# Patient Record
Sex: Female | Born: 1970
Health system: Southern US, Community
[De-identification: ages and names within clinical notes are randomized; demographics above are authoritative.]

## PROBLEM LIST (undated history)

## (undated) DIAGNOSIS — C801 Malignant (primary) neoplasm, unspecified: Secondary | ICD-10-CM

## (undated) DIAGNOSIS — E039 Hypothyroidism, unspecified: Secondary | ICD-10-CM

## (undated) DIAGNOSIS — M1990 Unspecified osteoarthritis, unspecified site: Secondary | ICD-10-CM

## (undated) DIAGNOSIS — R51 Headache: Secondary | ICD-10-CM

## (undated) DIAGNOSIS — F32A Depression, unspecified: Secondary | ICD-10-CM

## (undated) DIAGNOSIS — D649 Anemia, unspecified: Secondary | ICD-10-CM

## (undated) DIAGNOSIS — E079 Disorder of thyroid, unspecified: Secondary | ICD-10-CM

## (undated) DIAGNOSIS — I10 Essential (primary) hypertension: Secondary | ICD-10-CM

## (undated) DIAGNOSIS — F419 Anxiety disorder, unspecified: Secondary | ICD-10-CM

## (undated) DIAGNOSIS — G43909 Migraine, unspecified, not intractable, without status migrainosus: Secondary | ICD-10-CM

## (undated) DIAGNOSIS — F329 Major depressive disorder, single episode, unspecified: Secondary | ICD-10-CM

## (undated) DIAGNOSIS — N390 Urinary tract infection, site not specified: Secondary | ICD-10-CM

## (undated) DIAGNOSIS — K760 Fatty (change of) liver, not elsewhere classified: Secondary | ICD-10-CM

## (undated) DIAGNOSIS — N189 Chronic kidney disease, unspecified: Secondary | ICD-10-CM

## (undated) DIAGNOSIS — E119 Type 2 diabetes mellitus without complications: Secondary | ICD-10-CM

## (undated) DIAGNOSIS — R519 Headache, unspecified: Secondary | ICD-10-CM

## (undated) HISTORY — DX: Migraine, unspecified, not intractable, without status migrainosus: G43.909

## (undated) HISTORY — DX: Disorder of thyroid, unspecified: E07.9

## (undated) HISTORY — DX: Unspecified osteoarthritis, unspecified site: M19.90

## (undated) HISTORY — DX: Type 2 diabetes mellitus without complications: E11.9

## (undated) HISTORY — DX: Urinary tract infection, site not specified: N39.0

## (undated) HISTORY — DX: Depression, unspecified: F32.A

## (undated) HISTORY — PX: ABLATION: SHX5711

## (undated) HISTORY — DX: Anxiety disorder, unspecified: F41.9

## (undated) HISTORY — DX: Headache, unspecified: R51.9

## (undated) HISTORY — DX: Headache: R51

## (undated) HISTORY — DX: Major depressive disorder, single episode, unspecified: F32.9

---

## 1999-06-12 DIAGNOSIS — E282 Polycystic ovarian syndrome: Secondary | ICD-10-CM | POA: Insufficient documentation

## 1999-06-12 HISTORY — DX: Polycystic ovarian syndrome: E28.2

## 1999-12-23 DIAGNOSIS — N970 Female infertility associated with anovulation: Secondary | ICD-10-CM

## 1999-12-23 HISTORY — DX: Female infertility associated with anovulation: N97.0

## 2001-01-05 HISTORY — PX: UTERINE FIBROID SURGERY: SHX826

## 2017-09-09 DIAGNOSIS — R7989 Other specified abnormal findings of blood chemistry: Secondary | ICD-10-CM | POA: Diagnosis not present

## 2017-09-09 DIAGNOSIS — M2550 Pain in unspecified joint: Secondary | ICD-10-CM | POA: Diagnosis not present

## 2017-09-09 DIAGNOSIS — D509 Iron deficiency anemia, unspecified: Secondary | ICD-10-CM | POA: Diagnosis not present

## 2018-03-08 ENCOUNTER — Other Ambulatory Visit: Payer: Self-pay

## 2018-03-08 ENCOUNTER — Encounter (HOSPITAL_COMMUNITY): Payer: Self-pay | Admitting: Emergency Medicine

## 2018-03-08 ENCOUNTER — Ambulatory Visit (HOSPITAL_COMMUNITY)
Admission: EM | Admit: 2018-03-08 | Discharge: 2018-03-08 | Disposition: A | Discharge status: Home / Self Care | Payer: 59 | Attending: Emergency Medicine | Admitting: Emergency Medicine

## 2018-03-08 DIAGNOSIS — R110 Nausea: Secondary | ICD-10-CM | POA: Diagnosis not present

## 2018-03-08 DIAGNOSIS — R519 Headache, unspecified: Secondary | ICD-10-CM

## 2018-03-08 DIAGNOSIS — R51 Headache: Secondary | ICD-10-CM

## 2018-03-08 DIAGNOSIS — I10 Essential (primary) hypertension: Secondary | ICD-10-CM | POA: Diagnosis not present

## 2018-03-08 DIAGNOSIS — M7918 Myalgia, other site: Secondary | ICD-10-CM | POA: Diagnosis not present

## 2018-03-08 HISTORY — DX: Essential (primary) hypertension: I10

## 2018-03-08 LAB — POCT RAPID STREP A: Streptococcus, Group A Screen (Direct): NEGATIVE

## 2018-03-08 MED ORDER — METOCLOPRAMIDE HCL 5 MG/ML IJ SOLN
10.0000 mg | Freq: Once | INTRAMUSCULAR | Status: AC
  Administered 2018-03-08: 10 mg via INTRAMUSCULAR

## 2018-03-08 MED ORDER — BUTALBITAL-APAP-CAFFEINE 50-325-40 MG PO TABS: 1.0000 | ORAL_TABLET | ORAL | 0 refills | Status: DC | PRN

## 2018-03-08 MED ORDER — DEXAMETHASONE SODIUM PHOSPHATE 10 MG/ML IJ SOLN
10.0000 mg | Freq: Once | INTRAMUSCULAR | Status: AC
  Administered 2018-03-08: 10 mg via INTRAMUSCULAR

## 2018-03-08 MED ORDER — DIPHENHYDRAMINE HCL 25 MG PO CAPS
ORAL_CAPSULE | ORAL | Status: AC
  Filled 2018-03-08: qty 2

## 2018-03-08 MED ORDER — ACETAMINOPHEN 325 MG PO TABS: 975.0000 mg | ORAL_TABLET | Freq: Once | ORAL | Status: DC

## 2018-03-08 MED ORDER — KETOROLAC TROMETHAMINE 30 MG/ML IJ SOLN
INTRAMUSCULAR | Status: AC
  Filled 2018-03-08: qty 1

## 2018-03-08 MED ORDER — IBUPROFEN 600 MG PO TABS: 600.0000 mg | ORAL_TABLET | Freq: Four times a day (QID) | ORAL | 0 refills | Status: DC | PRN

## 2018-03-08 MED ORDER — ONDANSETRON 8 MG PO TBDP: ORAL_TABLET | ORAL | 0 refills | Status: DC

## 2018-03-08 MED ORDER — HYDROCHLOROTHIAZIDE 12.5 MG PO TABS: 12.5000 mg | ORAL_TABLET | Freq: Every day | ORAL | 0 refills | Status: DC

## 2018-03-08 MED ORDER — KETOROLAC TROMETHAMINE 30 MG/ML IJ SOLN
30.0000 mg | Freq: Once | INTRAMUSCULAR | Status: AC
  Administered 2018-03-08: 30 mg via INTRAMUSCULAR

## 2018-03-08 MED ORDER — METOCLOPRAMIDE HCL 5 MG/ML IJ SOLN
INTRAMUSCULAR | Status: AC
  Filled 2018-03-08: qty 2

## 2018-03-08 MED ORDER — DIPHENHYDRAMINE HCL 25 MG PO CAPS
50.0000 mg | ORAL_CAPSULE | Freq: Once | ORAL | Status: AC
  Administered 2018-03-08: 50 mg via ORAL

## 2018-03-08 MED ORDER — DEXAMETHASONE SODIUM PHOSPHATE 10 MG/ML IJ SOLN
INTRAMUSCULAR | Status: AC
  Filled 2018-03-08: qty 1

## 2018-03-08 NOTE — Discharge Instructions (Addendum)
Decrease your salt intake. diet and exercise will lower your blood pressure significantly. It is important to keep your blood pressure under good control, as having a elevated blood pressure for prolonged periods of time significantly increases your risk of stroke, heart attacks, kidney damage, eye damage, and other problems. Measure your blood pressure once a day, preferably at the same time every day. Keep a log of this and bring it to your next doctor's appointment.  Bring your blood pressure cuff as well.  Return here in a week for blood pressure recheck if you're unable to find a primary care physician by then. Return immediately to the ER if you start having chest pain, headache, problems seeing, problems talking, problems walking, if you feel like you're about to pass out, if you do pass out, if you have a seizure, or for any other concerns.  Below is a list of primary care practices who are taking new patients for you to follow-up with.  [facility] at [facility] [address] [place], [place] [address] [phone]  [place], [place] [address] [phone]  [facility] [phone] [place] [address]  [facility]: [place]  [phone]  [place] [facility]: [place]   [phone]  [facility]: [address] [place]  [phone]  [place] [facility] : [address] [place] [phone]  [facility]: [address] [place] [address] [phone]  [facility]: [place] [phone]  Dr. Blanchie Serve [place]  [phone]  Dr. Benito Mccreedy, [facility]. [place], [place] [address]  [phone]  Go to www.goodrx.com to look up your medications. This will give you a list of where you can find your prescriptions at the most affordable prices. Or ask the pharmacist what the cash price is, or if they have any other discount programs available to help make your medication more affordable. This can be less expensive than what you would pay with insurance.

## 2018-03-08 NOTE — ED Provider Notes (Signed)
HPI  SUBJECTIVE:  Mary Moyer is a 48 y.o. female who reports a dull, heavy, gradual onset frontal headache starting today.  It is been constant, and has lasted hours.  She reports nausea, feeling feverish, but did not check her temperature.  She reports some body aches.  She reports some postnasal drip, questionable sinus pain and pressure, she reports bilateral ear pain and jaw pain.  No change in her hearing, otorrhea.  She states that she feels as if she has been clenching her jaw all day long.  She reports photophobia, phonophobia and states that her neck hurts.  Reports lower dental pain, but thinks that this is more related to the jaw pain.  She denies neck stiffness.  No nasal congestion, rhinorrhea, coughing, sore throat.  No visual changes.  This HA did not occur during exertion.  No arm or leg weakness, facial droop, slurred speech, discoordination.  No syncope, seizures.  No alleviating factors.  She has not tried anything for this.  Symptoms are worse with light and noise.  She has a past medical history of hypertension hypothyroidism.  She has not been on her medications since last July.  She also has a history of migraines with aura.  She denies any aura with this headache.  No history of stroke, aneurysm, atrial fibrillation, temporal arteritis, glaucoma, diabetes, SAH/ICH.  He is not on any antiplatelets, anticoagulants.  PMD: None.   Past Medical History:  Diagnosis Date  . Hypertension     History reviewed. No pertinent surgical history.  History reviewed. No pertinent family history.  Social History   Tobacco Use  . Smoking status: Never Smoker  . Smokeless tobacco: Never Used  Substance Use Topics  . Alcohol use: Never    Frequency: Never  . Drug use: Never    No current facility-administered medications for this encounter.   Current Outpatient Medications:  .  butalbital-acetaminophen-caffeine (FIORICET, ESGIC) 50-325-40 MG tablet, Take 1-2 tablets by mouth  every 4 (four) hours as needed for headache. Max 6 caps/day, Disp: 20 tablet, Rfl: 0 .  hydrochlorothiazide (HYDRODIURIL) 12.5 MG tablet, Take 1 tablet (12.5 mg total) by mouth daily for 30 days., Disp: 30 tablet, Rfl: 0 .  ibuprofen (ADVIL,MOTRIN) 600 MG tablet, Take 1 tablet (600 mg total) by mouth every 6 (six) hours as needed., Disp: 30 tablet, Rfl: 0 .  ondansetron (ZOFRAN ODT) 8 MG disintegrating tablet, 1/2- 1 tablet q 8 hr prn nausea, vomiting, Disp: 20 tablet, Rfl: 0  No Known Allergies   ROS  As noted in HPI.   Physical Exam  BP (!) 173/110   Pulse 67   Temp 98.7 F (37.1 C) (Temporal)   Resp 16   LMP 03/08/2018 (Exact Date)   SpO2 98%   Constitutional: Well developed, well nourished, appears to be uncomfortable Eyes: PERRL, EOMI, conjunctiva normal bilaterally.  Direct or consensual photophobia. HENT: Normocephalic, atraumatic,mucus membranes moist, normal dentition.  TM normal b/l. + bilateral TMJ tenderness. No crepitus.  Normal dentition. postive tenderness over single left lower tooth with a filling intact.  No surrounding gingival swelling.  Normal oropharynx, normal tonsils, without exudates, uvula midline.  No nasal congestion.  Positive mild frontal sinus tenderness.  No sinus tenderness.  No temporal artery tenderness.  Neck: no cervical LN + bilateral trapezial muscle tenderness. No meningismus Respiratory: normal inspiratory effort Cardiovascular: Normal rate, regular rhythm, no murmurs rubs or gallops GI:  nondistended skin: No rash, skin intact Musculoskeletal: No edema, no tenderness, no deformities  Neurologic: Alert & oriented x 3, CN III-XII intact, romberg neg, finger-> nose, heel-> shin equal b/l, Romberg neg, tandem gait steady Psychiatric: Speech and behavior appropriate   ED Course  Medications  ketorolac (TORADOL) 30 MG/ML injection 30 mg (30 mg Intramuscular Given 03/08/18 2033)  diphenhydrAMINE (BENADRYL) capsule 50 mg (50 mg Oral Given 03/08/18  2033)  dexamethasone (DECADRON) injection 10 mg (10 mg Intramuscular Given 03/08/18 2033)  metoCLOPramide (REGLAN) injection 10 mg (10 mg Intramuscular Given 03/08/18 2033)    Orders Placed This Encounter  Procedures  . Culture, group A strep    Standing Status:   Standing    Number of Occurrences:   1  . Recheck vitals    repeat BP 30-45 min after meds    Standing Status:   Standing    Number of Occurrences:   1  . POCT rapid strep A [facility])    Standing Status:   Standing    Number of Occurrences:   1   Results for orders placed or performed during the hospital encounter of 03/08/18 (from the past 24 hour(s))  POCT rapid strep A [facility])     Status: None   Collection Time: 03/08/18  8:31 PM  Result Value Ref Range   Streptococcus, Group A Screen (Direct) NEGATIVE NEGATIVE   No results found.   ED Clinical Impression  Acute nonintractable headache, unspecified headache type  Hypertension, unspecified type  ED Assessment/Plan   no sudden onset. Doubt SAH, ICH or space occupying lesion. Pt without fevers/chills, Pt has no meningeal sx, no nuchal rigidity. Doubt meningitis. Pt with normal neuro exam, no evidence of CVA/TIA.  Pt BP not elevated significantly, doubt hypertensive emergency. No evidence of temporal artery tenderness, no evidence of glaucoma or other ocular pathology. Will give headache cocktail ( dexamethasone 10 IM, reglan 10 IM,  toradol 30 IM, benadryl 50 po ) and reassess.  Checking strep  As patient states that she had strep throat last year with similar presentation and no sore throat.  [place] controlled substances registry for this patient consulted and feel the risk/benefit ratio today is favorable for proceeding with this prescription for a controlled substance.  No opiate prescriptions in 2 years.  Strep negative.  Suspect this headache is multifactorial.  It does not appear to be a neurologic emergency.  On reevaluation, She states  her headache is getting much better. Pt with continued non-focal neuro exam.  Will send home with ibuprofen/Tylenol, or ibuprofen/Fioricet, Zofran.   Blood pressure still elevated. Will start hydrochlorothiazide 12.5 mg p.o.  States that she was on triamterene/hydrochlorothiazide in the past.  Unsure what dose.  Providing primary care referral list for ongoing care.  She is to buy a blood pressure cuff and keep a log of her blood pressure.  She is to return here in a week if she is unable to find a primary care physician for ongoing follow-up of her blood pressure.  Discussed labs, MDM, plan for follow up, signs and sx that should prompt return to ER. Pt agrees with plan  Meds ordered this encounter  Medications  . ketorolac (TORADOL) 30 MG/ML injection 30 mg  . diphenhydrAMINE (BENADRYL) capsule 50 mg  . dexamethasone (DECADRON) injection 10 mg  . metoCLOPramide (REGLAN) injection 10 mg  . DISCONTD: acetaminophen (TYLENOL) tablet 975 mg  . ibuprofen (ADVIL,MOTRIN) 600 MG tablet    Sig: Take 1 tablet (600 mg total) by mouth every 6 (six) hours as needed.  Dispense:  30 tablet    Refill:  0  . butalbital-acetaminophen-caffeine (FIORICET, ESGIC) 50-325-40 MG tablet    Sig: Take 1-2 tablets by mouth every 4 (four) hours as needed for headache. Max 6 caps/day    Dispense:  20 tablet    Refill:  0  . ondansetron (ZOFRAN ODT) 8 MG disintegrating tablet    Sig: 1/2- 1 tablet q 8 hr prn nausea, vomiting    Dispense:  20 tablet    Refill:  0  . hydrochlorothiazide (HYDRODIURIL) 12.5 MG tablet    Sig: Take 1 tablet (12.5 mg total) by mouth daily for 30 days.    Dispense:  30 tablet    Refill:  0    *This clinic note was created using [employer]. Therefore, there may be occasional mistakes despite careful proofreading.  ?    Melynda Ripple, MD 03/08/18 2130

## 2018-03-08 NOTE — ED Triage Notes (Signed)
The patient presented to the [facility] with a complaint of a headache and neck pain x 1 day.

## 2018-03-11 LAB — CULTURE, GROUP A STREP (THRC)

## 2018-03-21 ENCOUNTER — Ambulatory Visit: Payer: 59 | Admitting: Nurse Practitioner

## 2018-03-22 ENCOUNTER — Other Ambulatory Visit: Payer: Self-pay

## 2018-03-22 ENCOUNTER — Other Ambulatory Visit (HOSPITAL_COMMUNITY)
Admission: RE | Admit: 2018-03-22 | Discharge: 2018-03-22 | Disposition: A | Discharge status: Home / Self Care | Payer: 59 | Source: Ambulatory Visit | Attending: Nurse Practitioner | Admitting: Nurse Practitioner

## 2018-03-22 ENCOUNTER — Ambulatory Visit: Payer: 59 | Admitting: Nurse Practitioner

## 2018-03-22 ENCOUNTER — Encounter: Payer: Self-pay | Admitting: Nurse Practitioner

## 2018-03-22 VITALS — BP 160/90 | HR 79 | Temp 98.0°F | Ht 66.5 in | Wt 341.6 lb

## 2018-03-22 DIAGNOSIS — E039 Hypothyroidism, unspecified: Secondary | ICD-10-CM

## 2018-03-22 DIAGNOSIS — E119 Type 2 diabetes mellitus without complications: Secondary | ICD-10-CM | POA: Insufficient documentation

## 2018-03-22 DIAGNOSIS — Z113 Encounter for screening for infections with a predominantly sexual mode of transmission: Secondary | ICD-10-CM | POA: Insufficient documentation

## 2018-03-22 DIAGNOSIS — B851 Pediculosis due to Pediculus humanus corporis: Secondary | ICD-10-CM | POA: Insufficient documentation

## 2018-03-22 DIAGNOSIS — R739 Hyperglycemia, unspecified: Secondary | ICD-10-CM

## 2018-03-22 DIAGNOSIS — E785 Hyperlipidemia, unspecified: Secondary | ICD-10-CM

## 2018-03-22 DIAGNOSIS — A5901 Trichomonal vulvovaginitis: Secondary | ICD-10-CM

## 2018-03-22 DIAGNOSIS — R300 Dysuria: Secondary | ICD-10-CM | POA: Diagnosis not present

## 2018-03-22 DIAGNOSIS — Z8742 Personal history of other diseases of the female genital tract: Secondary | ICD-10-CM | POA: Insufficient documentation

## 2018-03-22 DIAGNOSIS — I10 Essential (primary) hypertension: Secondary | ICD-10-CM

## 2018-03-22 DIAGNOSIS — E782 Mixed hyperlipidemia: Secondary | ICD-10-CM

## 2018-03-22 DIAGNOSIS — N898 Other specified noninflammatory disorders of vagina: Secondary | ICD-10-CM

## 2018-03-22 HISTORY — DX: Hyperlipidemia, unspecified: E78.5

## 2018-03-22 HISTORY — DX: Hypothyroidism, unspecified: E03.9

## 2018-03-22 LAB — POCT URINALYSIS DIPSTICK
Bilirubin, UA: NEGATIVE
Glucose, UA: NEGATIVE
Ketones, UA: NEGATIVE
Nitrite, UA: NEGATIVE
Protein, UA: POSITIVE — AB
Spec Grav, UA: 1.02 (ref 1.010–1.025)
Urobilinogen, UA: 0.2 E.U./dL
pH, UA: 6 (ref 5.0–8.0)

## 2018-03-22 LAB — CBC
HCT: 35.9 % — ABNORMAL LOW (ref 36.0–46.0)
Hemoglobin: 11.5 g/dL — ABNORMAL LOW (ref 12.0–15.0)
MCHC: 32 g/dL (ref 30.0–36.0)
MCV: 81.2 fl (ref 78.0–100.0)
Platelets: 288 K/uL (ref 150.0–400.0)
RBC: 4.41 Mil/uL (ref 3.87–5.11)
RDW: 17.2 % — ABNORMAL HIGH (ref 11.5–15.5)
WBC: 9.8 K/uL (ref 4.0–10.5)

## 2018-03-22 LAB — COMPREHENSIVE METABOLIC PANEL WITH GFR
ALT: 22 U/L (ref 0–35)
AST: 19 U/L (ref 0–37)
Albumin: 4.3 g/dL (ref 3.5–5.2)
Alkaline Phosphatase: 64 U/L (ref 39–117)
BUN: 14 mg/dL (ref 6–23)
CO2: 31 meq/L (ref 19–32)
Calcium: 9.3 mg/dL (ref 8.4–10.5)
Chloride: 102 meq/L (ref 96–112)
Creatinine, Ser: 0.78 mg/dL (ref 0.40–1.20)
GFR: 79.39 mL/min
Glucose, Bld: 101 mg/dL — ABNORMAL HIGH (ref 70–99)
Potassium: 3.3 meq/L — ABNORMAL LOW (ref 3.5–5.1)
Sodium: 141 meq/L (ref 135–145)
Total Bilirubin: 0.2 mg/dL (ref 0.2–1.2)
Total Protein: 7.6 g/dL (ref 6.0–8.3)

## 2018-03-22 LAB — LIPID PANEL
Cholesterol: 229 mg/dL — ABNORMAL HIGH (ref 0–200)
HDL: 40.7 mg/dL (ref >39.00)
NonHDL: 188.42
Total CHOL/HDL Ratio: 6
Triglycerides: 205 mg/dL — ABNORMAL HIGH (ref 0.0–149.0)
VLDL: 41 mg/dL — ABNORMAL HIGH (ref 0.0–40.0)

## 2018-03-22 LAB — LDL CHOLESTEROL, DIRECT: Direct LDL: 160 mg/dL

## 2018-03-22 LAB — TSH: TSH: 67.44 u[IU]/mL — ABNORMAL HIGH (ref 0.35–4.50)

## 2018-03-22 LAB — POCT URINE PREGNANCY: PREG TEST UR: NEGATIVE

## 2018-03-22 LAB — HEMOGLOBIN A1C: Hgb A1c MFr Bld: 6.1 % (ref 4.6–6.5)

## 2018-03-22 MED ORDER — LEVOTHYROXINE SODIUM 50 MCG PO TABS: 50.0000 ug | ORAL_TABLET | Freq: Every day | ORAL | 0 refills | Status: DC

## 2018-03-22 MED ORDER — METFORMIN HCL ER 500 MG PO TB24: 500.0000 mg | ORAL_TABLET | Freq: Every day | ORAL | 2 refills | Status: DC

## 2018-03-22 MED ORDER — SULFAMETHOXAZOLE-TRIMETHOPRIM 800-160 MG PO TABS: 1.0000 | ORAL_TABLET | Freq: Two times a day (BID) | ORAL | 0 refills | Status: DC

## 2018-03-22 MED ORDER — AMLODIPINE BESYLATE 5 MG PO TABS: 5.0000 mg | ORAL_TABLET | Freq: Every day | ORAL | 1 refills | Status: DC

## 2018-03-22 MED ORDER — TRIAMTERENE-HCTZ 37.5-25 MG PO TABS: 0.5000 | ORAL_TABLET | Freq: Every day | ORAL | 0 refills | Status: DC

## 2018-03-22 NOTE — Assessment & Plan Note (Signed)
TSH of 67 Abnormal lipid panel Ordered thyroid US. Start synthroid 54mcg Repeat TSh and t4 in 6weeks.

## 2018-03-22 NOTE — Progress Notes (Addendum)
Subjective:  Patient ID: Mary Moyer, female    DOB: 1970/11/23  Age: 48 y.o. MRN: [id number]  CC: Establish Care (est care/UTI--going on for 4 days/took azo and ibuprofen, frequent urinate and burning at times--cant control. 2. high BP--use to take med but took herself of it. )  Urinary Tract Infection   This is a new problem. The current episode started in the past 7 days. The problem occurs every urination. The problem has been unchanged. The quality of the pain is described as aching and burning. There has been no fever. She is sexually active. There is no history of pyelonephritis. Associated symptoms include a discharge, a possible pregnancy and urgency. Pertinent negatives include no chills, flank pain, frequency, hematuria, hesitancy, sweats or vomiting. She has tried antibiotics for the symptoms. The treatment provided no relief. There is no history of kidney stones, recurrent UTIs, urinary stasis or a urological procedure.  Hypertension  This is a chronic problem. The current episode started more than 1 year ago. The problem is unchanged. The problem is uncontrolled. Associated symptoms include anxiety, headaches and peripheral edema. Pertinent negatives include no blurred vision, chest pain, malaise/fatigue, neck pain, orthopnea, palpitations, PND, shortness of breath or sweats. Agents associated with hypertension include NSAIDs. Risk factors for coronary artery disease include family history, obesity, sedentary lifestyle and stress. Past treatments include diuretics. The current treatment provides no improvement. Compliance problems include diet and exercise.  Identifiable causes of hypertension include a thyroid problem.    Hx of hypothyroidism, HTN, infertility due to endometriosis and PCOS, prediabetes and migraine headaches per patient. Discontinued medications over 63yrs ago. States she did not understand the need for medications. No thyroid US or biopsy in the past, no known neck  radiation.  Last pcp 33yrs ago with [facility].  She is divorced, moved from [place] to [place] 78yrs ago.  Sexually active, no use of condoms or hormonal contraception. LMP 02/13/2018.  Reviewed past Medical, Social and Family history today.  Outpatient Medications Prior to Visit  Medication Sig Dispense Refill  . butalbital-acetaminophen-caffeine (FIORICET, ESGIC) 50-325-40 MG tablet Take 1-2 tablets by mouth every 4 (four) hours as needed for headache. Max 6 caps/day 20 tablet 0  . ondansetron (ZOFRAN ODT) 8 MG disintegrating tablet 1/2- 1 tablet q 8 hr prn nausea, vomiting 20 tablet 0  . ibuprofen (ADVIL,MOTRIN) 600 MG tablet Take 1 tablet (600 mg total) by mouth every 6 (six) hours as needed. 30 tablet 0  . hydrochlorothiazide (HYDRODIURIL) 12.5 MG tablet Take 1 tablet (12.5 mg total) by mouth daily for 30 days. (Patient not taking: Reported on 03/22/2018) 30 tablet 0  . HYDROCHLOROTHIAZIDE PO Take 37.5 mg by mouth.    . levothyroxine (SYNTHROID, LEVOTHROID) 175 MCG tablet Take 175 mcg by mouth daily before breakfast.     No facility-administered medications prior to visit.     ROS See HPI  Objective:  BP (!) 160/90   Pulse 79   Temp 98 F (36.7 C) (Oral)   Ht 5' 6.5" (1.689 m)   Wt (!) 341 lb 9.6 oz (154.9 kg)   LMP 02/13/2018   SpO2 97%   BMI 54.31 kg/m   BP Readings from Last 3 Encounters:  03/22/18 (!) 160/90  03/08/18 (!) 173/110    Wt Readings from Last 3 Encounters:  03/22/18 (!) 341 lb 9.6 oz (154.9 kg)    Physical Exam Vitals signs reviewed.  Neck:     Musculoskeletal: Normal range of motion and neck supple.  Cardiovascular:     Rate and Rhythm: Normal rate and regular rhythm.     Pulses: Normal pulses.     Heart sounds: Normal heart sounds.  Abdominal:     General: Bowel sounds are normal.     Palpations: Abdomen is soft.     Tenderness: There is no abdominal tenderness. There is no right CVA tenderness or left CVA tenderness.  Musculoskeletal:      Right lower leg: Edema present.     Left lower leg: Edema present.  Neurological:     Mental Status: She is alert and oriented to person, place, and time.     Lab Results  Component Value Date   WBC 9.8 03/22/2018   HGB 11.5 (L) 03/22/2018   HCT 35.9 (L) 03/22/2018   PLT 288.0 03/22/2018   GLUCOSE 101 (H) 03/22/2018   CHOL 229 (H) 03/22/2018   TRIG 205.0 (H) 03/22/2018   HDL 40.70 03/22/2018   LDLDIRECT 160.0 03/22/2018   ALT 22 03/22/2018   AST 19 03/22/2018   NA 141 03/22/2018   K 3.3 (L) 03/22/2018   CL 102 03/22/2018   CREATININE 0.78 03/22/2018   BUN 14 03/22/2018   CO2 31 03/22/2018   TSH 67.44 (H) 03/22/2018   HGBA1C 6.1 03/22/2018     Assessment & Plan:   Taja was seen today for establish care.  Diagnoses and all orders for this visit:  Essential hypertension -     CBC -     Comprehensive metabolic panel -     TSH -     amLODipine (NORVASC) 5 MG tablet; Take 1 tablet (5 mg total) by mouth at bedtime. -     triamterene-hydrochlorothiazide (MAXZIDE-25) 37.5-25 MG tablet; Take 0.5 tablets by mouth daily.  Dysuria -     POCT urine pregnancy -     POCT urinalysis dipstick -     sulfamethoxazole-trimethoprim (BACTRIM DS,SEPTRA DS) 800-160 MG tablet; Take 1 tablet by mouth 2 (two) times daily. -     metroNIDAZOLE (FLAGYL) 500 MG tablet; Take 4 tablets (2,000 mg total) by mouth once for 1 dose.  Hyperglycemia -     Comprehensive metabolic panel -     Hemoglobin A1c -     metFORMIN (GLUCOPHAGE-XR) 500 MG 24 hr tablet; Take 1 tablet (500 mg total) by mouth daily with breakfast.  Morbid obesity (HCC) -     TSH -     Lipid panel -     metFORMIN (GLUCOPHAGE-XR) 500 MG 24 hr tablet; Take 1 tablet (500 mg total) by mouth daily with breakfast.  Trichomoniasis of vagina -     Urine cytology ancillary only([place]) -     Cervicovaginal ancillary only( [place]) -     metroNIDAZOLE (FLAGYL) 500 MG tablet; Take 4 tablets (2,000 mg total) by mouth once for 1  dose.  Screen for STD (sexually transmitted disease) -     Urine cytology ancillary only([place]) -     HIV Antibody (routine testing w rflx) -     Cervicovaginal ancillary only( [place])  Hypothyroidism, unspecified type -     levothyroxine (SYNTHROID) 50 MCG tablet; Take 1 tablet (50 mcg total) by mouth daily before breakfast. -     US THYROID; Future  Mixed hyperlipidemia -     LDL cholesterol, direct   I have discontinued Latika Biedermann's ibuprofen, levothyroxine, and HYDROCHLOROTHIAZIDE PO. I am also having her start on amLODipine, sulfamethoxazole-trimethoprim, triamterene-hydrochlorothiazide, metFORMIN, levothyroxine, and metroNIDAZOLE. Additionally, I  am having her maintain her butalbital-acetaminophen-caffeine, ondansetron, and hydrochlorothiazide.  Meds ordered this encounter  Medications  . amLODipine (NORVASC) 5 MG tablet    Sig: Take 1 tablet (5 mg total) by mouth at bedtime.    Dispense:  30 tablet    Refill:  1    Order Specific Question:   Supervising Provider    Answer:   MATTHEWS, CODY [[id number]]  . sulfamethoxazole-trimethoprim (BACTRIM DS,SEPTRA DS) 800-160 MG tablet    Sig: Take 1 tablet by mouth 2 (two) times daily.    Dispense:  10 tablet    Refill:  0    Order Specific Question:   Supervising Provider    Answer:   MATTHEWS, CODY [[id number]]  . triamterene-hydrochlorothiazide (MAXZIDE-25) 37.5-25 MG tablet    Sig: Take 0.5 tablets by mouth daily.    Dispense:  30 tablet    Refill:  0    Order Specific Question:   Supervising Provider    Answer:   MATTHEWS, CODY [[id number]]  . metFORMIN (GLUCOPHAGE-XR) 500 MG 24 hr tablet    Sig: Take 1 tablet (500 mg total) by mouth daily with breakfast.    Dispense:  30 tablet    Refill:  2    Order Specific Question:   Supervising Provider    Answer:   MATTHEWS, CODY [[id number]]  . levothyroxine (SYNTHROID) 50 MCG tablet    Sig: Take 1 tablet (50 mcg total) by mouth daily before breakfast.    Dispense:  90 tablet    Refill:   0    Order Specific Question:   Supervising Provider    Answer:   MATTHEWS, CODY [[id number]]  . metroNIDAZOLE (FLAGYL) 500 MG tablet    Sig: Take 4 tablets (2,000 mg total) by mouth once for 1 dose.    Dispense:  4 tablet    Refill:  0    Order Specific Question:   Supervising Provider    Answer:   MATTHEWS, CODY [[id number]]    Problem List Items Addressed This Visit      Cardiovascular and Mediastinum   Essential hypertension - Primary    Uncontrolled LE edema. Normal renal function. Start amlodipine and maxzide. Advised about need for DASH diet. F/up in 1week      Relevant Medications   amLODipine (NORVASC) 5 MG tablet   triamterene-hydrochlorothiazide (MAXZIDE-25) 37.5-25 MG tablet   Other Relevant Orders   CBC (Completed)   Comprehensive metabolic panel (Completed)   TSH (Completed)     Endocrine   Hypothyroidism    TSH of 67 Abnormal lipid panel Ordered thyroid US. Start synthroid 25mcg Repeat TSh and t4 in 6weeks.       Relevant Medications   levothyroxine (SYNTHROID) 50 MCG tablet   Other Relevant Orders   US THYROID     Other   Hyperglycemia   Relevant Medications   metFORMIN (GLUCOPHAGE-XR) 500 MG 24 hr tablet   Other Relevant Orders   Comprehensive metabolic panel (Completed)   Hemoglobin A1c (Completed)   Hyperlipidemia    No statin at this time. Work on correcting thyroid function and glucose tolerance Repeat lipid panel in 69months (fasting) Advised about need for DASh diet and regular exercise to promote weight loss      Relevant Medications   amLODipine (NORVASC) 5 MG tablet   triamterene-hydrochlorothiazide (MAXZIDE-25) 37.5-25 MG tablet   Other Relevant Orders   LDL cholesterol, direct (Completed)   Morbid obesity (HCC)   Relevant Medications   metFORMIN (GLUCOPHAGE-XR) 500 MG 24 hr  tablet   Other Relevant Orders   TSH (Completed)   Lipid panel (Completed)    Other Visit Diagnoses    Dysuria       Relevant Medications    sulfamethoxazole-trimethoprim (BACTRIM DS,SEPTRA DS) 800-160 MG tablet   metroNIDAZOLE (FLAGYL) 500 MG tablet   Other Relevant Orders   POCT urine pregnancy (Completed)   POCT urinalysis dipstick (Completed)   Trichomoniasis of vagina       Relevant Medications   sulfamethoxazole-trimethoprim (BACTRIM DS,SEPTRA DS) 800-160 MG tablet   metroNIDAZOLE (FLAGYL) 500 MG tablet   Other Relevant Orders   Urine cytology ancillary only([place]) (Completed)   Cervicovaginal ancillary only( [employer])   Screen for STD (sexually transmitted disease)       Relevant Orders   Urine cytology ancillary only([place]) (Completed)   HIV Antibody (routine testing w rflx) (Completed)   Cervicovaginal ancillary only( [place])       Follow-up: Return in about 1 week (around 03/29/2018) for HTN and depression (83mins).  Wilfred Lacy, NP

## 2018-03-22 NOTE — Assessment & Plan Note (Signed)
No statin at this time. Work on correcting thyroid function and glucose tolerance Repeat lipid panel in 36months (fasting) Advised about need for DASh diet and regular exercise to promote weight loss

## 2018-03-22 NOTE — Patient Instructions (Addendum)
TSH indicates hypothyroidism. Need to start levothyroxine 44mcg once a day on empty stomach. CMP indicates normal liver and ranl function with mild decrease in potassium. Need to start maxzide in place of HCTZ. Abnormal lipid panel. Need to start DASH diet as discussed. Repeat in 74months (fasting). HgbA1c of 6.1 which indicates prediabetes. Need to start metformin due to high risk of cardiovascular disease. Cbc indicates mild anemia. Will repeat in 29months. Need Iron sat check at this time. F/up in 1week as discussed  Start amlodipine today at bedtime.   DASH Eating Plan DASH stands for "Dietary Approaches to Stop Hypertension." The DASH eating plan is a healthy eating plan that has been shown to reduce high blood pressure (hypertension). It may also reduce your risk for type 2 diabetes, heart disease, and stroke. The DASH eating plan may also help with weight loss. What are tips for following this plan?  General guidelines  Avoid eating more than 2,300 mg (milligrams) of salt (sodium) a day. If you have hypertension, you may need to reduce your sodium intake to 1,500 mg a day.  Limit alcohol intake to no more than 1 drink a day for nonpregnant women and 2 drinks a day for men. One drink equals 12 oz of beer, 5 oz of wine, or 1 oz of hard liquor.  Work with your health care provider to maintain a healthy body weight or to lose weight. Ask what an ideal weight is for you.  Get at least 30 minutes of exercise that causes your heart to beat faster (aerobic exercise) most days of the week. Activities may include walking, swimming, or biking.  Work with your health care provider or diet and nutrition specialist (dietitian) to adjust your eating plan to your individual calorie needs. Reading food labels   Check food labels for the amount of sodium per serving. Choose foods with less than 5 percent of the Daily Value of sodium. Generally, foods with less than 300 mg of sodium per serving fit  into this eating plan.  To find whole grains, look for the word "whole" as the first word in the ingredient list. Shopping  Buy products labeled as "low-sodium" or "no salt added."  Buy fresh foods. Avoid canned foods and premade or frozen meals. Cooking  Avoid adding salt when cooking. Use salt-free seasonings or herbs instead of table salt or sea salt. Check with your health care provider or pharmacist before using salt substitutes.  Do not fry foods. Cook foods using healthy methods such as baking, boiling, grilling, and broiling instead.  Cook with heart-healthy oils, such as olive, canola, soybean, or sunflower oil. Meal planning  Eat a balanced diet that includes: ? 5 or more servings of fruits and vegetables each day. At each meal, try to fill half of your plate with fruits and vegetables. ? Up to 6-8 servings of whole grains each day. ? Less than 6 oz of lean meat, poultry, or fish each day. A 3-oz serving of meat is about the same size as a deck of cards. One egg equals 1 oz. ? 2 servings of low-fat dairy each day. ? A serving of nuts, seeds, or beans 5 times each week. ? Heart-healthy fats. Healthy fats called Omega-3 fatty acids are found in foods such as flaxseeds and coldwater fish, like sardines, salmon, and mackerel.  Limit how much you eat of the following: ? Canned or prepackaged foods. ? Food that is high in trans fat, such as fried foods. ? Food  that is high in saturated fat, such as fatty meat. ? Sweets, desserts, sugary drinks, and other foods with added sugar. ? Full-fat dairy products.  Do not salt foods before eating.  Try to eat at least 2 vegetarian meals each week.  Eat more home-cooked food and less restaurant, buffet, and fast food.  When eating at a restaurant, ask that your food be prepared with less salt or no salt, if possible. What foods are recommended? The items listed may not be a complete list. Talk with your dietitian about what dietary  choices are best for you. Grains Whole-grain or whole-wheat bread. Whole-grain or whole-wheat pasta. Brown rice. [place]. Bulgur. Whole-grain and low-sodium cereals. Pita bread. Low-fat, low-sodium crackers. Whole-wheat flour tortillas. Vegetables Fresh or frozen vegetables (raw, steamed, roasted, or grilled). Low-sodium or reduced-sodium tomato and vegetable juice. Low-sodium or reduced-sodium tomato sauce and tomato paste. Low-sodium or reduced-sodium canned vegetables. Fruits All fresh, dried, or frozen fruit. Canned fruit in natural juice (without added sugar). Meat and other protein foods Skinless chicken or [place]. Ground chicken or [place]. Pork with fat trimmed off. Fish and seafood. Egg whites. Dried beans, peas, or lentils. Unsalted nuts, nut butters, and seeds. Unsalted canned beans. Lean cuts of beef with fat trimmed off. Low-sodium, lean deli meat. Dairy Low-fat (1%) or fat-free (skim) milk. Fat-free, low-fat, or reduced-fat cheeses. Nonfat, low-sodium ricotta or cottage cheese. Low-fat or nonfat yogurt. Low-fat, low-sodium cheese. Fats and oils Soft margarine without trans fats. Vegetable oil. Low-fat, reduced-fat, or light mayonnaise and salad dressings (reduced-sodium). Canola, safflower, olive, soybean, and sunflower oils. Avocado. Seasoning and other foods Herbs. Spices. Seasoning mixes without salt. Unsalted popcorn and pretzels. Fat-free sweets. What foods are not recommended? The items listed may not be a complete list. Talk with your dietitian about what dietary choices are best for you. Grains Baked goods made with fat, such as croissants, muffins, or some breads. Dry pasta or rice meal packs. Vegetables Creamed or fried vegetables. Vegetables in a cheese sauce. Regular canned vegetables (not low-sodium or reduced-sodium). Regular canned tomato sauce and paste (not low-sodium or reduced-sodium). Regular tomato and vegetable juice (not low-sodium or reduced-sodium).  Angie Fava. Olives. Fruits Canned fruit in a light or heavy syrup. Fried fruit. Fruit in cream or butter sauce. Meat and other protein foods Fatty cuts of meat. Ribs. Fried meat. Berniece Salines. Sausage. Bologna and other processed lunch meats. Salami. Fatback. Hotdogs. Bratwurst. Salted nuts and seeds. Canned beans with added salt. Canned or smoked fish. Whole eggs or egg yolks. Chicken or [place] with skin. Dairy Whole or 2% milk, cream, and half-and-half. Whole or full-fat cream cheese. Whole-fat or sweetened yogurt. Full-fat cheese. Nondairy creamers. Whipped toppings. Processed cheese and cheese spreads. Fats and oils Butter. Stick margarine. Lard. Shortening. Ghee. Bacon fat. Tropical oils, such as coconut, palm kernel, or palm oil. Seasoning and other foods Salted popcorn and pretzels. Onion salt, garlic salt, seasoned salt, table salt, and sea salt. Worcestershire sauce. Tartar sauce. Barbecue sauce. Teriyaki sauce. Soy sauce, including reduced-sodium. Steak sauce. Canned and packaged gravies. Fish sauce. Oyster sauce. Cocktail sauce. Horseradish that you find on the shelf. Ketchup. Mustard. Meat flavorings and tenderizers. Bouillon cubes. Hot sauce and Tabasco sauce. Premade or packaged marinades. Premade or packaged taco seasonings. Relishes. Regular salad dressings. Where to find more information:  National Heart, Lung, and [place]: https://wilson-eaton.com/  American Heart Association: www.heart.org Summary  The DASH eating plan is a healthy eating plan that has been shown to reduce high blood pressure (  hypertension). It may also reduce your risk for type 2 diabetes, heart disease, and stroke.  With the DASH eating plan, you should limit salt (sodium) intake to 2,300 mg a day. If you have hypertension, you may need to reduce your sodium intake to 1,500 mg a day.  When on the DASH eating plan, aim to eat more fresh fruits and vegetables, whole grains, lean proteins, low-fat dairy, and  heart-healthy fats.  Work with your health care provider or diet and nutrition specialist (dietitian) to adjust your eating plan to your individual calorie needs. This information is not intended to replace advice given to you by your health care provider. Make sure you discuss any questions you have with your health care provider. Document Released: 12/11/2010 Document Revised: 12/16/2015 Document Reviewed: 12/16/2015 Elsevier Interactive Patient Education  2019 [employer].

## 2018-03-22 NOTE — Assessment & Plan Note (Addendum)
Uncontrolled LE edema. Normal renal function. Start amlodipine and maxzide. Advised about need for DASH diet. F/up in 1week

## 2018-03-23 ENCOUNTER — Encounter: Payer: Self-pay | Admitting: Nurse Practitioner

## 2018-03-23 LAB — URINE CYTOLOGY ANCILLARY ONLY
CHLAMYDIA, DNA PROBE: NEGATIVE
Neisseria Gonorrhea: NEGATIVE
TRICH (WINDOWPATH): POSITIVE — AB

## 2018-03-23 LAB — HIV ANTIBODY (ROUTINE TESTING W REFLEX): HIV 1&2 Ab, 4th Generation: NONREACTIVE

## 2018-03-23 MED ORDER — METRONIDAZOLE 500 MG PO TABS: 2000.0000 mg | ORAL_TABLET | Freq: Once | ORAL | 0 refills | Status: AC

## 2018-03-23 NOTE — Addendum Note (Signed)
Addended by: Leana Gamer on: 03/23/2018 04:34 PM   Modules accepted: Orders

## 2018-03-24 LAB — CERVICOVAGINAL ANCILLARY ONLY
Bacterial vaginitis: POSITIVE — AB
Candida vaginitis: NEGATIVE

## 2018-03-30 ENCOUNTER — Encounter: Payer: Self-pay | Admitting: Nurse Practitioner

## 2018-03-30 ENCOUNTER — Other Ambulatory Visit: Payer: Self-pay

## 2018-03-30 ENCOUNTER — Ambulatory Visit (INDEPENDENT_AMBULATORY_CARE_PROVIDER_SITE_OTHER): Payer: 59 | Admitting: Nurse Practitioner

## 2018-03-30 DIAGNOSIS — F411 Generalized anxiety disorder: Secondary | ICD-10-CM | POA: Diagnosis not present

## 2018-03-30 DIAGNOSIS — T502X5A Adverse effect of carbonic-anhydrase inhibitors, benzothiadiazides and other diuretics, initial encounter: Secondary | ICD-10-CM

## 2018-03-30 DIAGNOSIS — E876 Hypokalemia: Secondary | ICD-10-CM

## 2018-03-30 DIAGNOSIS — F322 Major depressive disorder, single episode, severe without psychotic features: Secondary | ICD-10-CM | POA: Insufficient documentation

## 2018-03-30 DIAGNOSIS — I10 Essential (primary) hypertension: Secondary | ICD-10-CM | POA: Diagnosis not present

## 2018-03-30 DIAGNOSIS — A5901 Trichomonal vulvovaginitis: Secondary | ICD-10-CM

## 2018-03-30 MED ORDER — BUPROPION HCL ER (XL) 150 MG PO TB24: 150.0000 mg | ORAL_TABLET | Freq: Every day | ORAL | 1 refills | Status: DC

## 2018-03-30 MED ORDER — HYDROCHLOROTHIAZIDE 25 MG PO TABS: 25.0000 mg | ORAL_TABLET | Freq: Every day | ORAL | 0 refills | Status: DC

## 2018-03-30 MED ORDER — POTASSIUM CHLORIDE CRYS ER 20 MEQ PO TBCR: 20.0000 meq | EXTENDED_RELEASE_TABLET | Freq: Every day | ORAL | 0 refills | Status: DC

## 2018-03-30 MED ORDER — LISINOPRIL 10 MG PO TABS: 10.0000 mg | ORAL_TABLET | Freq: Every day | ORAL | 1 refills | Status: DC

## 2018-03-30 NOTE — Patient Instructions (Addendum)
Needs to provide BP reading for next 3days. Check BP once a day in morning and record. Send readings via mychart message on Friday. Return to lab for urine collection on Monday at 4pm  Stop amlodipine Start lisinopril, potassium, wellbutrin Continue HCTZ at increased dose 12.5 to 25mg . F/up in 3weeks  Advised to call sooner if any questions or concerns. Advised to call 911 if any SI or HI

## 2018-03-30 NOTE — Progress Notes (Signed)
Virtual Visit via Video Note  I connected with Mary Moyer on 03/30/18 at  3:00 PM EDT by a video enabled telemedicine application and verified that I am speaking with the correct person using two identifiers.   I discussed the limitations of evaluation and management by telemedicine and the availability of in person appointments. The patient expressed understanding and agreed to proceed.  CC: 1 wk follow up BP and depression/thyroid result consult. FYI--pt havent not taking her BP at home,depression is little better as of today, amlodipine gives her headache and pt still has swelling in the feet. Maxelt--pt didnt know she has it at drug store. No vital sign to share--at work today.   Reviewed medication list with patient.  History of Present Illness:  HTN: Does not check BP at home. Has not made any changes to diet at this time. Continued taking HCTZ since last OV. Did not start Maxzide as prescribed Reports worsening headache with amlodipine at bedtime.  Anxiety and Depression: Denies SI and HI during video call, even though PHQ-9 questionnaire indicates that she has suicidal ideation. Denies any hx of suicide attempt. Not interested in counseling sessions at this time. Use of Wellbutrin in past with no adverse effects. Depression screen [facility] 2/9 03/22/2018 03/22/2018  Decreased Interest 2 3  Down, Depressed, Hopeless 3 3  PHQ - 2 Score 5 6  Altered sleeping 3 -  Tired, decreased energy 2 -  Change in appetite 1 -  Feeling bad or failure about yourself  3 -  Trouble concentrating 3 -  Moving slowly or fidgety/restless 0 -  Suicidal thoughts 2 -  PHQ-9 Score 19 -   GAD 7 : Generalized Anxiety Score 03/22/2018  Nervous, Anxious, on Edge 2  Control/stop worrying 3  Worry too much - different things 3  Trouble relaxing 1  Restless 0  Easily annoyed or irritable 0  Afraid - awful might happen 3  Total GAD 7 Score 12   Review of Systems  Constitutional: Negative for  malaise/fatigue and weight loss.  Cardiovascular: Negative for chest pain, palpitations and leg swelling.  Neurological: Positive for headaches. Negative for dizziness, tingling, focal weakness and loss of consciousness.  Psychiatric/Behavioral: Positive for depression. Negative for hallucinations, memory loss, substance abuse and suicidal ideas. The patient is nervous/anxious. The patient does not have insomnia.    Observations/Objective: Unable to obtain any vital signs. Physical Exam  Constitutional: She is oriented to person, place, and time and well-developed, well-nourished, and in no distress. No distress.  Neck: Normal range of motion. Neck supple.  Pulmonary/Chest: Effort normal.  Neurological: She is alert and oriented to person, place, and time.  Psychiatric: Her mood appears anxious. She exhibits a depressed mood. She expresses no homicidal and no suicidal ideation. She expresses no suicidal plans and no homicidal plans.  Tearful during video call    Assessment and Plan: Mary Moyer was seen today for follow-up.  Diagnoses and all orders for this visit:  Essential hypertension -     hydrochlorothiazide (HYDRODIURIL) 25 MG tablet; Take 1 tablet (25 mg total) by mouth daily for 30 days. -     potassium chloride SA (K-DUR,KLOR-CON) 20 MEQ tablet; Take 1 tablet (20 mEq total) by mouth daily. -     lisinopril (PRINIVIL,ZESTRIL) 10 MG tablet; Take 1 tablet (10 mg total) by mouth daily.  Depression, major, single episode, severe (HCC) -     buPROPion (WELLBUTRIN XL) 150 MG 24 hr tablet; Take 1 tablet (150 mg total) by  mouth daily.  Trichomoniasis of vagina -     Urine cytology ancillary only([facility]); Future  GAD (generalized anxiety disorder) -     buPROPion (WELLBUTRIN XL) 150 MG 24 hr tablet; Take 1 tablet (150 mg total) by mouth daily.  Diuretic-induced hypokalemia -     potassium chloride SA (K-DUR,KLOR-CON) 20 MEQ tablet; Take 1 tablet (20 mEq total) by mouth  daily.    Follow Up Instructions: Needs to provide BP reading for next 3days. Check BP once a day in morning and record. Send readings via mychart message on Friday. Return to lab for urine collection on Monday at 4pm  Stop amlodipine Start lisinopril, potassium, wellbutrin Continue HCTZ at increased dose 12.5 to 25mg . F/up in 3weeks  I discussed the assessment and treatment plan with the patient. The patient was provided an opportunity to ask questions and all were answered. The patient agreed with the plan and demonstrated an understanding of the instructions.   The patient was advised to call back or seek an in-person evaluation if the symptoms worsen or if the condition fails to improve as anticipated.  I provided 30 minutes of non-face-to-face time during this encounter.   Wilfred Lacy, NP

## 2018-03-30 NOTE — Assessment & Plan Note (Signed)
Denies SI and HI during video call, even though PHQ-9 questionnaire indicates that she has suicidal ideation. Denies any hx of suicide attempt. Declined counseling sessions at this time due to busy schedule.  Start wellbutrin F/up in 3weeks. Advised to call sooner if any questions or concerns. Advised to call 911 if any SI or HI She verbalized understanding

## 2018-03-30 NOTE — Assessment & Plan Note (Signed)
Unable to provide any BP readings Unable to tolerate amlodipine 5mg .  Has been taking HCTZ instead of maxzide.  D/c amlodipine. Start lisinopril and potassium Maintain HCTZ, dose increased to 25mg . Check BP once a day in morning and record. Send readings via mychart message on Friday. Maintain upcoming appt in 3weeks.

## 2018-04-04 ENCOUNTER — Other Ambulatory Visit: Payer: 59

## 2018-04-07 ENCOUNTER — Other Ambulatory Visit: Payer: 59

## 2018-04-07 ENCOUNTER — Other Ambulatory Visit (HOSPITAL_COMMUNITY)
Admission: RE | Admit: 2018-04-07 | Discharge: 2018-04-07 | Disposition: A | Discharge status: Home / Self Care | Payer: 59 | Source: Ambulatory Visit | Attending: Nurse Practitioner | Admitting: Nurse Practitioner

## 2018-04-07 DIAGNOSIS — A5901 Trichomonal vulvovaginitis: Secondary | ICD-10-CM | POA: Insufficient documentation

## 2018-04-11 LAB — URINE CYTOLOGY ANCILLARY ONLY
Chlamydia: NEGATIVE
Neisseria Gonorrhea: NEGATIVE
Trichomonas: POSITIVE — AB

## 2018-04-11 MED ORDER — METRONIDAZOLE 500 MG PO TABS: 500.0000 mg | ORAL_TABLET | Freq: Two times a day (BID) | ORAL | 0 refills | Status: DC

## 2018-04-11 NOTE — Addendum Note (Signed)
Addended by: Leana Gamer on: 04/11/2018 04:43 PM   Modules accepted: Orders

## 2018-04-21 ENCOUNTER — Encounter: Payer: Self-pay | Admitting: Nurse Practitioner

## 2018-04-21 ENCOUNTER — Ambulatory Visit (INDEPENDENT_AMBULATORY_CARE_PROVIDER_SITE_OTHER): Payer: 59 | Admitting: Nurse Practitioner

## 2018-04-21 VITALS — Ht 66.5 in

## 2018-04-21 DIAGNOSIS — I10 Essential (primary) hypertension: Secondary | ICD-10-CM

## 2018-04-21 DIAGNOSIS — F411 Generalized anxiety disorder: Secondary | ICD-10-CM

## 2018-04-21 DIAGNOSIS — F322 Major depressive disorder, single episode, severe without psychotic features: Secondary | ICD-10-CM

## 2018-04-21 MED ORDER — TRAZODONE HCL 50 MG PO TABS: 50.0000 mg | ORAL_TABLET | Freq: Every day | ORAL | 0 refills | Status: DC

## 2018-04-21 NOTE — Assessment & Plan Note (Signed)
No SI or HI No change in mood. Interrupted sleep Reports she is compliant with wellbutrin and denies any adverse effects  Try trazodone ay HS. F/up in 2weeks Change wellbutrin to prozac if no improvement in 2weeks.

## 2018-04-21 NOTE — Progress Notes (Signed)
Virtual Visit via Video Note  I connected with Mary Moyer on 04/21/18 at  2:00 PM EDT by a video enabled telemedicine application and verified that I am speaking with the correct person using two identifiers.   I discussed the limitations of evaluation and management by telemedicine and the availability of in person appointments. The patient expressed understanding and agreed to proceed.  CC: 1 mo fu--HTN and depression--do not check BP at home--no vital sign to share  History of Present Illness: HTN: Current use of HCTZ and lisinopril. Does not check BP at home.  Anxiety and Depression: No change with wellbutrin. Experiencing interrupted sleep and panic attacks at bedtime. Refuses referral to psychologist. No SI of HI   Observations/Objective: No vital signs to provided. Physical Exam  Pulmonary/Chest: Effort normal.  Psychiatric: Thought content is not delusional. Cognition and memory are normal. She exhibits a depressed mood. She expresses no homicidal and no suicidal ideation. She expresses no suicidal plans and no homicidal plans.  Tearful during video call   Assessment and Plan: Mary Moyer was seen today for follow-up.  Diagnoses and all orders for this visit:  Depression, major, single episode, severe ([place]) -     traZODone (DESYREL) 50 MG tablet; Take 1 tablet (50 mg total) by mouth at bedtime.  GAD (generalized anxiety disorder) -     traZODone (DESYREL) 50 MG tablet; Take 1 tablet (50 mg total) by mouth at bedtime.  Essential hypertension    Follow Up Instructions: Maintain wellbutrin Start trazodone at bedtime. F/up in office in 2weeks. Encourage to use mobile app called headspace. Will need labs repeated at that time (TSH, T4, BMP, CBC, iron panel, and urine cytology)   I discussed the assessment and treatment plan with the patient. The patient was provided an opportunity to ask questions and all were answered. The patient agreed with the plan and  demonstrated an understanding of the instructions.   The patient was advised to call back or seek an in-person evaluation if the symptoms worsen or if the condition fails to improve as anticipated.   Wilfred Lacy, NP

## 2018-05-06 ENCOUNTER — Encounter: Payer: Self-pay | Admitting: Nurse Practitioner

## 2018-05-06 ENCOUNTER — Ambulatory Visit: Payer: 59 | Admitting: Nurse Practitioner

## 2018-05-06 ENCOUNTER — Telehealth: Payer: Self-pay | Admitting: Nurse Practitioner

## 2018-05-06 ENCOUNTER — Other Ambulatory Visit (HOSPITAL_COMMUNITY)
Admission: RE | Admit: 2018-05-06 | Discharge: 2018-05-06 | Disposition: A | Discharge status: Home / Self Care | Payer: 59 | Source: Ambulatory Visit | Attending: Nurse Practitioner | Admitting: Nurse Practitioner

## 2018-05-06 VITALS — BP 144/94 | HR 71 | Temp 98.6°F | Ht 66.5 in | Wt 338.6 lb

## 2018-05-06 DIAGNOSIS — A5901 Trichomonal vulvovaginitis: Secondary | ICD-10-CM | POA: Insufficient documentation

## 2018-05-06 DIAGNOSIS — F322 Major depressive disorder, single episode, severe without psychotic features: Secondary | ICD-10-CM | POA: Diagnosis not present

## 2018-05-06 DIAGNOSIS — I10 Essential (primary) hypertension: Secondary | ICD-10-CM | POA: Diagnosis not present

## 2018-05-06 DIAGNOSIS — F411 Generalized anxiety disorder: Secondary | ICD-10-CM

## 2018-05-06 DIAGNOSIS — D500 Iron deficiency anemia secondary to blood loss (chronic): Secondary | ICD-10-CM

## 2018-05-06 DIAGNOSIS — E876 Hypokalemia: Secondary | ICD-10-CM | POA: Diagnosis not present

## 2018-05-06 DIAGNOSIS — T502X5A Adverse effect of carbonic-anhydrase inhibitors, benzothiadiazides and other diuretics, initial encounter: Secondary | ICD-10-CM

## 2018-05-06 DIAGNOSIS — E039 Hypothyroidism, unspecified: Secondary | ICD-10-CM

## 2018-05-06 DIAGNOSIS — R739 Hyperglycemia, unspecified: Secondary | ICD-10-CM

## 2018-05-06 LAB — BASIC METABOLIC PANEL
BUN: 13 mg/dL (ref 6–23)
CO2: 28 mEq/L (ref 19–32)
Calcium: 8.9 mg/dL (ref 8.4–10.5)
Chloride: 103 mEq/L (ref 96–112)
Creatinine, Ser: 0.8 mg/dL (ref 0.40–1.20)
GFR: 77.06 mL/min (ref >60.00)
Glucose, Bld: 116 mg/dL — ABNORMAL HIGH (ref 70–99)
Potassium: 4.2 mEq/L (ref 3.5–5.1)
Sodium: 140 mEq/L (ref 135–145)

## 2018-05-06 LAB — IBC + FERRITIN
Ferritin: 9.9 ng/mL — ABNORMAL LOW (ref 10.0–291.0)
Iron: 54 ug/dL (ref 42–145)
Saturation Ratios: 13.4 % — ABNORMAL LOW (ref 20.0–50.0)
Transferrin: 288 mg/dL (ref 212.0–360.0)

## 2018-05-06 LAB — TSH: TSH: 55.01 u[IU]/mL — ABNORMAL HIGH (ref 0.35–4.50)

## 2018-05-06 LAB — T4, FREE: Free T4: 0.51 ng/dL — ABNORMAL LOW (ref 0.60–1.60)

## 2018-05-06 MED ORDER — FLUOXETINE HCL 20 MG PO TABS: 20.0000 mg | ORAL_TABLET | Freq: Every day | ORAL | 3 refills | Status: DC

## 2018-05-06 MED ORDER — LEVOTHYROXINE SODIUM 100 MCG PO TABS: 100.0000 ug | ORAL_TABLET | Freq: Every day | ORAL | 2 refills | Status: DC

## 2018-05-06 MED ORDER — LISINOPRIL 10 MG PO TABS: 10.0000 mg | ORAL_TABLET | Freq: Every day | ORAL | 1 refills | Status: DC

## 2018-05-06 MED ORDER — FERROUS SULFATE 325 (65 FE) MG PO TABS: 325.0000 mg | ORAL_TABLET | Freq: Two times a day (BID) | ORAL | 3 refills | Status: DC

## 2018-05-06 MED ORDER — METFORMIN HCL ER 500 MG PO TB24: 500.0000 mg | ORAL_TABLET | Freq: Every day | ORAL | 2 refills | Status: DC

## 2018-05-06 MED ORDER — TRIAMTERENE-HCTZ 37.5-25 MG PO TABS: 1.0000 | ORAL_TABLET | Freq: Every day | ORAL | 2 refills | Status: DC

## 2018-05-06 NOTE — Assessment & Plan Note (Signed)
Start ferrous sulfate OTC BID

## 2018-05-06 NOTE — Patient Instructions (Addendum)
Stop wellbutrin and start prozac.  mild improvement in TSH.  Sent increase dose of levothyroxine to 191mcg. Iron panel indicates mild iron deficient anemia. Start ferrous sulfate OTC 325mg  1tab BID (with food). Stable renal function. F/up in 72month as discussed

## 2018-05-06 NOTE — Assessment & Plan Note (Signed)
changed wellbutrin XL to prozac F/up in 48month

## 2018-05-06 NOTE — Telephone Encounter (Signed)
[facility] called stating that insurance is not going to cover Fluoxetine 20 mg tablets but they will cover Fluoxetine Capsule. Ok to change, please advise.

## 2018-05-06 NOTE — Progress Notes (Signed)
Subjective:  Patient ID: Mary Moyer, female    DOB: 21-Sep-1970  Age: 48 y.o. MRN: [id number]  CC: Follow-up (2 wk follow up--med refills, fasting--unable to tell if meds working good due to short term use. FYI--Triamterin/HCTz--still take this med?)  HPI  HTN: improved with lisinopril and HCTZ. BP Readings from Last 3 Encounters:  05/06/18 (!) 144/94  03/22/18 (!) 160/90  03/08/18 (!) 173/110   Anxiety and Depression: No improvement with wellbutrin Denies any SI or HI. Use of trazodone as needed.  Reviewed past Medical, Social and Family history today.  Outpatient Medications Prior to Visit  Medication Sig Dispense Refill  . traZODone (DESYREL) 50 MG tablet Take 1 tablet (50 mg total) by mouth at bedtime. 15 tablet 0  . buPROPion (WELLBUTRIN XL) 150 MG 24 hr tablet Take 1 tablet (150 mg total) by mouth daily. 30 tablet 1  . levothyroxine (SYNTHROID) 50 MCG tablet Take 1 tablet (50 mcg total) by mouth daily before breakfast. 90 tablet 0  . lisinopril (PRINIVIL,ZESTRIL) 10 MG tablet Take 1 tablet (10 mg total) by mouth daily. 30 tablet 1  . metFORMIN (GLUCOPHAGE-XR) 500 MG 24 hr tablet Take 1 tablet (500 mg total) by mouth daily with breakfast. 30 tablet 2  . potassium chloride SA (K-DUR,KLOR-CON) 20 MEQ tablet Take 1 tablet (20 mEq total) by mouth daily. 30 tablet 0  . hydrochlorothiazide (HYDRODIURIL) 25 MG tablet Take 1 tablet (25 mg total) by mouth daily for 30 days. 30 tablet 0   No facility-administered medications prior to visit.     ROS See HPI  Objective:  BP (!) 144/94   Pulse 71   Temp 98.6 F (37 C) (Oral)   Ht 5' 6.5" (1.689 m)   Wt (!) 338 lb 9.6 oz (153.6 kg)   SpO2 97%   BMI 53.83 kg/m   BP Readings from Last 3 Encounters:  05/06/18 (!) 144/94  03/22/18 (!) 160/90  03/08/18 (!) 173/110    Wt Readings from Last 3 Encounters:  05/06/18 (!) 338 lb 9.6 oz (153.6 kg)  03/22/18 (!) 341 lb 9.6 oz (154.9 kg)    Physical Exam Vitals signs reviewed.   Cardiovascular:     Rate and Rhythm: Normal rate.  Pulmonary:     Effort: Pulmonary effort is normal.     Breath sounds: Normal breath sounds.  Musculoskeletal:     Right lower leg: No edema.     Left lower leg: No edema.  Neurological:     Mental Status: She is alert and oriented to person, place, and time.  Psychiatric:        Thought Content: Thought content normal.     Lab Results  Component Value Date   WBC 9.8 03/22/2018   HGB 11.5 (L) 03/22/2018   HCT 35.9 (L) 03/22/2018   PLT 288.0 03/22/2018   GLUCOSE 116 (H) 05/06/2018   CHOL 229 (H) 03/22/2018   TRIG 205.0 (H) 03/22/2018   HDL 40.70 03/22/2018   LDLDIRECT 160.0 03/22/2018   ALT 22 03/22/2018   AST 19 03/22/2018   NA 140 05/06/2018   K 4.2 05/06/2018   CL 103 05/06/2018   CREATININE 0.80 05/06/2018   BUN 13 05/06/2018   CO2 28 05/06/2018   TSH 55.01 (H) 05/06/2018   HGBA1C 6.1 03/22/2018    Assessment & Plan:   Mary Moyer was seen today for follow-up.  Diagnoses and all orders for this visit:  Essential hypertension -     Basic metabolic panel -  lisinopril (ZESTRIL) 10 MG tablet; Take 1 tablet (10 mg total) by mouth daily. -     triamterene-hydrochlorothiazide (MAXZIDE-25) 37.5-25 MG tablet; Take 1 tablet by mouth daily.  Hypothyroidism, unspecified type -     TSH -     T4, free -     levothyroxine (SYNTHROID) 100 MCG tablet; Take 1 tablet (100 mcg total) by mouth daily before breakfast.  Diuretic-induced hypokalemia -     Basic metabolic panel  Depression, major, single episode, severe (HCC) -     FLUoxetine (PROZAC) 20 MG tablet; Take 1 tablet (20 mg total) by mouth daily.  GAD (generalized anxiety disorder) -     FLUoxetine (PROZAC) 20 MG tablet; Take 1 tablet (20 mg total) by mouth daily.  Iron deficiency anemia due to chronic blood loss -     IBC + Ferritin -     ferrous sulfate 325 (65 FE) MG tablet; Take 1 tablet (325 mg total) by mouth 2 (two) times daily with a meal.   Trichomoniasis of vagina -     Urine cytology ancillary only([place])  Hyperglycemia -     metFORMIN (GLUCOPHAGE-XR) 500 MG 24 hr tablet; Take 1 tablet (500 mg total) by mouth daily with breakfast.  Morbid obesity ([place]) -     metFORMIN (GLUCOPHAGE-XR) 500 MG 24 hr tablet; Take 1 tablet (500 mg total) by mouth daily with breakfast.   I have discontinued Mary Moyer's hydrochlorothiazide, potassium chloride SA, and buPROPion. I have also changed her lisinopril and levothyroxine. Additionally, I am having her start on FLUoxetine, triamterene-hydrochlorothiazide, and ferrous sulfate. Lastly, I am having her maintain her traZODone and metFORMIN.  Meds ordered this encounter  Medications  . FLUoxetine (PROZAC) 20 MG tablet    Sig: Take 1 tablet (20 mg total) by mouth daily.    Dispense:  30 tablet    Refill:  3    Order Specific Question:   Supervising Provider    Answer:   Lucille Passy [[id number]]  . lisinopril (ZESTRIL) 10 MG tablet    Sig: Take 1 tablet (10 mg total) by mouth daily.    Dispense:  30 tablet    Refill:  1    Order Specific Question:   Supervising Provider    Answer:   Lucille Passy [[id number]]  . metFORMIN (GLUCOPHAGE-XR) 500 MG 24 hr tablet    Sig: Take 1 tablet (500 mg total) by mouth daily with breakfast.    Dispense:  30 tablet    Refill:  2    Order Specific Question:   Supervising Provider    Answer:   Lucille Passy [[id number]]  . levothyroxine (SYNTHROID) 100 MCG tablet    Sig: Take 1 tablet (100 mcg total) by mouth daily before breakfast.    Dispense:  30 tablet    Refill:  2    Order Specific Question:   Supervising Provider    Answer:   Lucille Passy [[id number]]  . triamterene-hydrochlorothiazide (MAXZIDE-25) 37.5-25 MG tablet    Sig: Take 1 tablet by mouth daily.    Dispense:  30 tablet    Refill:  2    Order Specific Question:   Supervising Provider    Answer:   Lucille Passy [[id number]]  . ferrous sulfate 325 (65 FE) MG tablet    Sig: Take 1 tablet (325 mg total)  by mouth 2 (two) times daily with a meal.    Refill:  3    Order Specific Question:  Supervising Provider    Answer:   Lucille Passy [[id number]]    Problem List Items Addressed This Visit      Cardiovascular and Mediastinum   Essential hypertension - Primary    Improved with lisinopril and HCTZ. Low potassium corrected. Change HCTZ to maxzide D/c potassium BP Readings from Last 3 Encounters:  05/06/18 (!) 144/94  03/22/18 (!) 160/90  03/08/18 (!) 173/110   F/up in 66month      Relevant Medications   lisinopril (ZESTRIL) 10 MG tablet   triamterene-hydrochlorothiazide (MAXZIDE-25) 37.5-25 MG tablet   Other Relevant Orders   Basic metabolic panel (Completed)     Endocrine   Hypothyroidism   Relevant Medications   levothyroxine (SYNTHROID) 100 MCG tablet   Other Relevant Orders   TSH (Completed)   T4, free (Completed)     Other   Depression, major, single episode, severe (HCC)    No improvement with wellbutrin XL150mg  after 4weeks of use No SI or HI Changed to prozac F/up in 16month      Relevant Medications   FLUoxetine (PROZAC) 20 MG tablet   Diuretic-induced hypokalemia   Relevant Orders   Basic metabolic panel (Completed)   GAD (generalized anxiety disorder)    changed wellbutrin XL to prozac F/up in 64month      Relevant Medications   FLUoxetine (PROZAC) 20 MG tablet   Hyperglycemia   Relevant Medications   metFORMIN (GLUCOPHAGE-XR) 500 MG 24 hr tablet   Iron deficiency anemia due to chronic blood loss    Start ferrous sulfate OTC BID      Relevant Medications   ferrous sulfate 325 (65 FE) MG tablet   Other Relevant Orders   IBC + Ferritin (Completed)   Morbid obesity (HCC)   Relevant Medications   metFORMIN (GLUCOPHAGE-XR) 500 MG 24 hr tablet    Other Visit Diagnoses    Trichomoniasis of vagina       Relevant Orders   Urine cytology ancillary only([place])       Follow-up: Return in about 4 weeks (around 06/03/2018) for HTN and  depression.(F2F, repeat PHQ/GAD, check thyroid function and  vitamin D?.  Wilfred Lacy, NP

## 2018-05-06 NOTE — Telephone Encounter (Signed)
Ok to change to capsule?  

## 2018-05-06 NOTE — Assessment & Plan Note (Addendum)
No improvement with wellbutrin XL150mg  after 4weeks of use No SI or HI Changed to prozac F/up in 54month

## 2018-05-06 NOTE — Assessment & Plan Note (Signed)
Improved with lisinopril and HCTZ. Low potassium corrected. Change HCTZ to maxzide D/c potassium BP Readings from Last 3 Encounters:  05/06/18 (!) 144/94  03/22/18 (!) 160/90  03/08/18 (!) 173/110   F/up in 45month

## 2018-05-06 NOTE — Telephone Encounter (Signed)
Pharmacy notified.

## 2018-05-09 LAB — URINE CYTOLOGY ANCILLARY ONLY
Chlamydia: NEGATIVE
Neisseria Gonorrhea: NEGATIVE
Trichomonas: NEGATIVE

## 2018-05-16 ENCOUNTER — Encounter: Payer: Self-pay | Admitting: Nurse Practitioner

## 2018-05-24 ENCOUNTER — Other Ambulatory Visit: Payer: 59

## 2018-06-03 ENCOUNTER — Ambulatory Visit: Payer: 59 | Admitting: Nurse Practitioner

## 2018-06-03 ENCOUNTER — Encounter: Payer: Self-pay | Admitting: Nurse Practitioner

## 2018-06-03 ENCOUNTER — Other Ambulatory Visit (HOSPITAL_COMMUNITY)
Admission: RE | Admit: 2018-06-03 | Discharge: 2018-06-03 | Disposition: A | Discharge status: Home / Self Care | Payer: 59 | Source: Ambulatory Visit | Attending: Nurse Practitioner | Admitting: Nurse Practitioner

## 2018-06-03 VITALS — BP 120/74 | HR 88 | Temp 98.5°F | Ht 66.5 in | Wt 337.0 lb

## 2018-06-03 DIAGNOSIS — F322 Major depressive disorder, single episode, severe without psychotic features: Secondary | ICD-10-CM | POA: Diagnosis not present

## 2018-06-03 DIAGNOSIS — Z8742 Personal history of other diseases of the female genital tract: Secondary | ICD-10-CM | POA: Insufficient documentation

## 2018-06-03 DIAGNOSIS — A5901 Trichomonal vulvovaginitis: Secondary | ICD-10-CM

## 2018-06-03 DIAGNOSIS — F411 Generalized anxiety disorder: Secondary | ICD-10-CM

## 2018-06-03 DIAGNOSIS — I10 Essential (primary) hypertension: Secondary | ICD-10-CM | POA: Diagnosis not present

## 2018-06-03 DIAGNOSIS — N898 Other specified noninflammatory disorders of vagina: Secondary | ICD-10-CM | POA: Insufficient documentation

## 2018-06-03 MED ORDER — METRONIDAZOLE 500 MG PO TABS: 500.0000 mg | ORAL_TABLET | Freq: Once | ORAL | 0 refills | Status: AC

## 2018-06-03 MED ORDER — TRAZODONE HCL 50 MG PO TABS: 50.0000 mg | ORAL_TABLET | Freq: Every day | ORAL | 0 refills | Status: DC

## 2018-06-03 NOTE — Progress Notes (Addendum)
Subjective:  Patient ID: Mary Moyer, female    DOB: [birth year]  Age: 48 y.o. MRN: [id number]  CC: Follow-up (follow up on HTN and depression--) and Vaginal Itching (pt also complain of vaginal ichy,odor/ going on 5 days/just finish he her period on 05/31/2018. )  Vaginal Itching  The patient's primary symptoms include genital itching and a genital odor. The patient's pertinent negatives include no genital lesions, genital rash, missed menses, pelvic pain, vaginal bleeding or vaginal discharge. This is a new problem. The current episode started in the past 7 days. The problem occurs constantly. The problem has been unchanged. The patient is experiencing no pain. She is not pregnant. Pertinent negatives include no abdominal pain, anorexia, back pain, chills, constipation, diarrhea, discolored urine, dysuria, fever, flank pain, frequency, hematuria, joint pain, nausea, painful intercourse, rash or urgency. The symptoms are aggravated by intercourse. She has tried nothing for the symptoms. She is sexually active. It is unknown whether or not her partner has an STD. She uses condoms for contraception. Her menstrual history has been regular. Her past medical history is significant for endometriosis, menorrhagia, an STD and vaginosis. There is no history of PID.   HTN: improved with lisinopril and triamterene BP Readings from Last 3 Encounters:  06/03/18 120/74  05/06/18 (!) 144/94  03/22/18 (!) 160/90   Did not start trazodone as prescribed. No change in mood with prozac Reports thoughts about death but has no intention or plan to hurt herself or another person. Declined referral to psychology and psychiatry Depression screen [facility] 2/9 06/03/2018 03/22/2018 03/22/2018  Decreased Interest 2 2 3   Down, Depressed, Hopeless 1 3 3   PHQ - 2 Score 3 5 6   Altered sleeping 3 3 -  Tired, decreased energy 1 2 -  Change in appetite 0 1 -  Feeling bad or failure about yourself  1 3 -  Trouble concentrating 3  3 -  Moving slowly or fidgety/restless 0 0 -  Suicidal thoughts 1 2 -  PHQ-9 Score 12 19 -   GAD 7 : Generalized Anxiety Score 06/03/2018 03/22/2018  Nervous, Anxious, on Edge 3 2  Control/stop worrying 2 3  Worry too much - different things 2 3  Trouble relaxing 1 1  Restless 0 0  Easily annoyed or irritable 2 0  Afraid - awful might happen 1 3  Total GAD 7 Score 11 12   Reviewed past Medical, Social and Family history today.  Outpatient Medications Prior to Visit  Medication Sig Dispense Refill  . ferrous sulfate 325 (65 FE) MG tablet Take 1 tablet (325 mg total) by mouth 2 (two) times daily with a meal.  3  . FLUoxetine (PROZAC) 20 MG tablet Take 1 tablet (20 mg total) by mouth daily. 30 tablet 3  . levothyroxine (SYNTHROID) 100 MCG tablet Take 1 tablet (100 mcg total) by mouth daily before breakfast. 30 tablet 2  . lisinopril (ZESTRIL) 10 MG tablet Take 1 tablet (10 mg total) by mouth daily. 30 tablet 1  . metFORMIN (GLUCOPHAGE-XR) 500 MG 24 hr tablet Take 1 tablet (500 mg total) by mouth daily with breakfast. 30 tablet 2  . triamterene-hydrochlorothiazide (MAXZIDE-25) 37.5-25 MG tablet Take 1 tablet by mouth daily. 30 tablet 2  . traZODone (DESYREL) 50 MG tablet Take 1 tablet (50 mg total) by mouth at bedtime. 15 tablet 0   No facility-administered medications prior to visit.     ROS See HPI  Objective:  BP 120/74   Pulse 88  Temp 98.5 F (36.9 C) (Oral)   Ht 5' 6.5" (1.689 m)   Wt (!) 337 lb (152.9 kg)   SpO2 98%   BMI 53.58 kg/m   BP Readings from Last 3 Encounters:  06/03/18 120/74  05/06/18 (!) 144/94  03/22/18 (!) 160/90    Wt Readings from Last 3 Encounters:  06/03/18 (!) 337 lb (152.9 kg)  05/06/18 (!) 338 lb 9.6 oz (153.6 kg)  03/22/18 (!) 341 lb 9.6 oz (154.9 kg)    Physical Exam Cardiovascular:     Rate and Rhythm: Normal rate and regular rhythm.  Pulmonary:     Effort: Pulmonary effort is normal.     Breath sounds: Normal breath sounds.   Musculoskeletal:     Right lower leg: No edema.     Left lower leg: No edema.  Neurological:     Mental Status: She is alert and oriented to person, place, and time.  Psychiatric:        Mood and Affect: Mood normal.        Behavior: Behavior normal.        Thought Content: Thought content normal.        Judgment: Judgment normal.     Lab Results  Component Value Date   WBC 9.8 03/22/2018   HGB 11.5 (L) 03/22/2018   HCT 35.9 (L) 03/22/2018   PLT 288.0 03/22/2018   GLUCOSE 116 (H) 05/06/2018   CHOL 229 (H) 03/22/2018   TRIG 205.0 (H) 03/22/2018   HDL 40.70 03/22/2018   LDLDIRECT 160.0 03/22/2018   ALT 22 03/22/2018   AST 19 03/22/2018   NA 140 05/06/2018   K 4.2 05/06/2018   CL 103 05/06/2018   CREATININE 0.80 05/06/2018   BUN 13 05/06/2018   CO2 28 05/06/2018   TSH 55.01 (H) 05/06/2018   HGBA1C 6.1 03/22/2018    Assessment & Plan:   Mary Moyer was seen today for follow-up and vaginal itching.  Diagnoses and all orders for this visit:  Essential hypertension  Depression, major, single episode, severe (HCC) -     traZODone (DESYREL) 50 MG tablet; Take 1 tablet (50 mg total) by mouth at bedtime.  GAD (generalized anxiety disorder) -     traZODone (DESYREL) 50 MG tablet; Take 1 tablet (50 mg total) by mouth at bedtime.  Vaginitis due to Trichomonas -     Urine cytology ancillary only([place]) -     metroNIDAZOLE (FLAGYL) 500 MG tablet; Take 1 tablet (500 mg total) by mouth once for 1 dose. -     Urine cytology ancillary only([place]); Future   I am having Mary Moyer start on metroNIDAZOLE. I am also having her maintain her FLUoxetine, lisinopril, metFORMIN, levothyroxine, triamterene-hydrochlorothiazide, ferrous sulfate, and traZODone.  Meds ordered this encounter  Medications  . metroNIDAZOLE (FLAGYL) 500 MG tablet    Sig: Take 1 tablet (500 mg total) by mouth once for 1 dose.    Dispense:  4 tablet    Refill:  0    Order Specific Question:    Supervising Provider    Answer:   MATTHEWS, CODY [[id number]]  . traZODone (DESYREL) 50 MG tablet    Sig: Take 1 tablet (50 mg total) by mouth at bedtime.    Dispense:  30 tablet    Refill:  0    Order Specific Question:   Supervising Provider    Answer:   MATTHEWS, CODY [[id number]]    Problem List Items Addressed This Visit  Cardiovascular and Mediastinum   Essential hypertension - Primary     Other   Depression, major, single episode, severe (HCC)   Relevant Medications   traZODone (DESYREL) 50 MG tablet   GAD (generalized anxiety disorder)   Relevant Medications   traZODone (DESYREL) 50 MG tablet    Other Visit Diagnoses    Vaginitis due to Trichomonas       Relevant Orders   Urine cytology ancillary only([place]) (Completed)   Urine cytology ancillary only()      Follow-up: Return in about 4 weeks (around 07/01/2018) for HTN, , hyperlipidemia, hypothyroid (fasting).  Wilfred Lacy, NP

## 2018-06-03 NOTE — Patient Instructions (Addendum)
Need to schedule thyroid US.  Go to lab for urine collection.  Start trazodone at bedtime. Continue other medications.  F/up in 68month (fasting)

## 2018-06-07 LAB — URINE CYTOLOGY ANCILLARY ONLY
Chlamydia: NEGATIVE
Neisseria Gonorrhea: NEGATIVE
Trichomonas: POSITIVE — AB

## 2018-06-07 NOTE — Addendum Note (Signed)
Addended by: Leana Gamer on: 06/07/2018 04:26 PM   Modules accepted: Orders, Level of Service

## 2018-06-09 LAB — URINE CYTOLOGY ANCILLARY ONLY
Bacterial vaginitis: POSITIVE — AB
Candida vaginitis: NEGATIVE

## 2018-06-29 ENCOUNTER — Ambulatory Visit
Admission: RE | Admit: 2018-06-29 | Discharge: 2018-06-29 | Disposition: A | Discharge status: Home / Self Care | Payer: 59 | Source: Ambulatory Visit | Attending: Nurse Practitioner | Admitting: Nurse Practitioner

## 2018-06-29 DIAGNOSIS — E039 Hypothyroidism, unspecified: Secondary | ICD-10-CM

## 2018-06-30 ENCOUNTER — Other Ambulatory Visit: Payer: Self-pay | Admitting: Nurse Practitioner

## 2018-06-30 DIAGNOSIS — E065 Other chronic thyroiditis: Secondary | ICD-10-CM

## 2018-06-30 DIAGNOSIS — E039 Hypothyroidism, unspecified: Secondary | ICD-10-CM

## 2018-07-05 ENCOUNTER — Ambulatory Visit: Payer: 59 | Admitting: Nurse Practitioner

## 2018-07-05 ENCOUNTER — Other Ambulatory Visit: Payer: Self-pay

## 2018-07-05 ENCOUNTER — Encounter: Payer: Self-pay | Admitting: Nurse Practitioner

## 2018-07-05 VITALS — BP 124/72 | HR 79 | Temp 98.2°F | Ht 66.5 in | Wt 347.2 lb

## 2018-07-05 DIAGNOSIS — E039 Hypothyroidism, unspecified: Secondary | ICD-10-CM

## 2018-07-05 DIAGNOSIS — F322 Major depressive disorder, single episode, severe without psychotic features: Secondary | ICD-10-CM | POA: Diagnosis not present

## 2018-07-05 DIAGNOSIS — F411 Generalized anxiety disorder: Secondary | ICD-10-CM | POA: Diagnosis not present

## 2018-07-05 DIAGNOSIS — D500 Iron deficiency anemia secondary to blood loss (chronic): Secondary | ICD-10-CM

## 2018-07-05 DIAGNOSIS — I10 Essential (primary) hypertension: Secondary | ICD-10-CM

## 2018-07-05 DIAGNOSIS — Z113 Encounter for screening for infections with a predominantly sexual mode of transmission: Secondary | ICD-10-CM

## 2018-07-05 DIAGNOSIS — E782 Mixed hyperlipidemia: Secondary | ICD-10-CM

## 2018-07-05 DIAGNOSIS — R739 Hyperglycemia, unspecified: Secondary | ICD-10-CM

## 2018-07-05 MED ORDER — VITAMIN D 50 MCG (2000 UT) PO TABS: 2000.0000 [IU] | ORAL_TABLET | Freq: Every day | ORAL | 0 refills | Status: DC

## 2018-07-05 MED ORDER — LISINOPRIL 10 MG PO TABS: 10.0000 mg | ORAL_TABLET | Freq: Every day | ORAL | 1 refills | Status: DC

## 2018-07-05 MED ORDER — FLUOXETINE HCL 20 MG PO TABS: 20.0000 mg | ORAL_TABLET | Freq: Every day | ORAL | 1 refills | Status: DC

## 2018-07-05 NOTE — Progress Notes (Addendum)
Subjective:  Patient ID: Mary Moyer, female    DOB: May 19, 1970  Age: 48 y.o. MRN: [id number]  CC: Follow-up (1 mo follow up--not fasting--weight gain consult,med refills/fyi--pt hasnt take her med this morning. )  HPI  Obesity: Concerned about continuous weight gain. She has not made any change to diet, nor started any exercise regimen. Reports she is very tired when she returns home from work.reports non restful sleep. She is not interested in going for sleep study at this time.  Depression and Anxiety: Improving with prozac daily and trazodone prn Will like referral for counseling. Depression screen [facility] 2/9 07/05/2018 06/03/2018 03/22/2018  Decreased Interest 1 2 2   Down, Depressed, Hopeless 1 1 3   PHQ - 2 Score 2 3 5   Altered sleeping 1 3 3   Tired, decreased energy 1 1 2   Change in appetite 1 0 1  Feeling bad or failure about yourself  1 1 3   Trouble concentrating 1 3 3   Moving slowly or fidgety/restless 0 0 0  Suicidal thoughts 0 1 2  PHQ-9 Score 7 12 19    GAD 7 : Generalized Anxiety Score 07/05/2018 06/03/2018 03/22/2018  Nervous, Anxious, on Edge 1 3 2   Control/stop worrying 1 2 3   Worry too much - different things 1 2 3   Trouble relaxing 1 1 1   Restless 0 0 0  Easily annoyed or irritable 1 2 0  Afraid - awful might happen 1 1 3   Total GAD 7 Score 6 11 12    Hypothyroidism: Reports persistent weight gain, and fatigue. Has not been contacted by endocrinology. Thyroid US indicates chronic thyroiditis.  HTN: Controlled with lisinopril and maxzide. BP Readings from Last 3 Encounters:  07/05/18 124/72  06/03/18 120/74  05/06/18 (!) 144/94   Reviewed past Medical, Social and Family history today.  Outpatient Medications Prior to Visit  Medication Sig Dispense Refill  . traZODone (DESYREL) 50 MG tablet Take 1 tablet (50 mg total) by mouth at bedtime. 30 tablet 0  . triamterene-hydrochlorothiazide (MAXZIDE-25) 37.5-25 MG tablet Take 1 tablet by mouth daily. 30 tablet  2  . ferrous sulfate 325 (65 FE) MG tablet Take 1 tablet (325 mg total) by mouth 2 (two) times daily with a meal.  3  . FLUoxetine (PROZAC) 20 MG tablet Take 1 tablet (20 mg total) by mouth daily. 30 tablet 3  . levothyroxine (SYNTHROID) 100 MCG tablet Take 1 tablet (100 mcg total) by mouth daily before breakfast. 30 tablet 2  . lisinopril (ZESTRIL) 10 MG tablet Take 1 tablet (10 mg total) by mouth daily. 30 tablet 1  . metFORMIN (GLUCOPHAGE-XR) 500 MG 24 hr tablet Take 1 tablet (500 mg total) by mouth daily with breakfast. 30 tablet 2   No facility-administered medications prior to visit.     ROS See HPI  Objective:  BP 124/72   Pulse 79   Temp 98.2 F (36.8 C) (Oral)   Ht 5' 6.5" (1.689 m)   Wt (!) 347 lb 3.2 oz (157.5 kg)   SpO2 98%   BMI 55.20 kg/m   BP Readings from Last 3 Encounters:  07/05/18 124/72  06/03/18 120/74  05/06/18 (!) 144/94    Wt Readings from Last 3 Encounters:  07/05/18 (!) 347 lb 3.2 oz (157.5 kg)  06/03/18 (!) 337 lb (152.9 kg)  05/06/18 (!) 338 lb 9.6 oz (153.6 kg)    Physical Exam Vitals signs reviewed.  Constitutional:      Appearance: She is obese.  Cardiovascular:  Rate and Rhythm: Normal rate and regular rhythm.     Heart sounds: Normal heart sounds.  Pulmonary:     Effort: Pulmonary effort is normal.     Breath sounds: Normal breath sounds.  Musculoskeletal:     Right lower leg: No edema.     Left lower leg: No edema.  Neurological:     Mental Status: She is alert and oriented to person, place, and time.  Psychiatric:        Mood and Affect: Mood normal.        Behavior: Behavior normal.        Thought Content: Thought content normal.    Lab Results  Component Value Date   WBC 7.6 07/06/2018   HGB 10.6 (L) 07/06/2018   HCT 33.3 (L) 07/06/2018   PLT 280.0 07/06/2018   GLUCOSE 116 (H) 05/06/2018   CHOL 194 07/06/2018   TRIG 198.0 (H) 07/06/2018   HDL 35.60 (L) 07/06/2018   LDLDIRECT 160.0 03/22/2018   LDLCALC 119 (H)  07/06/2018   ALT 22 03/22/2018   AST 19 03/22/2018   NA 140 05/06/2018   K 4.2 05/06/2018   CL 103 05/06/2018   CREATININE 0.80 05/06/2018   BUN 13 05/06/2018   CO2 28 05/06/2018   TSH 47.40 (H) 07/06/2018   HGBA1C 6.5 07/06/2018    US Thyroid  Result Date: 06/29/2018 CLINICAL DATA:  Hypothyroid.  Elevated TSH. EXAM: THYROID ULTRASOUND TECHNIQUE: Ultrasound examination of the thyroid gland and adjacent soft tissues was performed. COMPARISON:  None. FINDINGS: Parenchymal Echotexture: Moderately heterogenous Isthmus: 0.4 cm Right lobe: 4.1 x 1.4 x 1.9 cm Left lobe: 3.8 x 1.4 x 1.4 cm _________________________________________________________ Estimated total number of nodules >/= 1 cm: 0 Number of spongiform nodules >/=  2 cm not described below (TR1): 0 Number of mixed cystic and solid nodules >/= 1.5 cm not described below (TR2): 0 _________________________________________________________ The thyroid parenchyma is diffusely heterogeneous bilaterally in a pattern suggestive of thyroiditis. No discrete nodules are seen within the thyroid gland. No abnormal lymph nodes identified. IMPRESSION: Normal-sized and heterogeneous thyroid gland. No focal nodules identified. Appearance of the thyroid gland is suggestive of thyroiditis. The above is in keeping with the ACR TI-RADS recommendations - J Am Coll Radiol 2017;14:587-595. Electronically Signed   By: Aletta Edouard M.D.   On: 06/29/2018 15:50    Assessment & Plan:   Mary Moyer was seen today for follow-up.  Diagnoses and all orders for this visit:  Essential hypertension -     lisinopril (ZESTRIL) 10 MG tablet; Take 1 tablet (10 mg total) by mouth daily.  Hypothyroidism, unspecified type -     TSH; Future -     T4, free; Future -     levothyroxine (SYNTHROID) 125 MCG tablet; Take 1 tablet (125 mcg total) by mouth daily before breakfast.  Depression, major, single episode, severe (HCC) -     Ambulatory referral to Psychology -     FLUoxetine  (PROZAC) 20 MG tablet; Take 1 tablet (20 mg total) by mouth daily.  GAD (generalized anxiety disorder) -     Ambulatory referral to Psychology -     FLUoxetine (PROZAC) 20 MG tablet; Take 1 tablet (20 mg total) by mouth daily.  Mixed hyperlipidemia -     Lipid panel; Future  Hyperglycemia -     Hemoglobin A1c; Future  Morbid obesity ([place]) -     Amb Ref to Medical Weight Management -     Cholecalciferol (VITAMIN D) 50  MCG (2000 UT) tablet; Take 1 tablet (2,000 Units total) by mouth daily.  Iron deficiency anemia due to chronic blood loss -     Iron, TIBC and Ferritin Panel; Future -     CBC w/Diff; Future -     docusate sodium (COLACE) 100 MG capsule; Take 1 capsule (100 mg total) by mouth 2 (two) times daily. -     ferrous sulfate 325 (65 FE) MG tablet; Take 1 tablet (325 mg total) by mouth 2 (two) times daily with a meal.  Screen for STD (sexually transmitted disease) -     B-HCG Quant; Future -     HIV antibody (with reflex); Future -     RPR; Future   I have changed Vanellope Strawderman's levothyroxine. I am also having her start on Vitamin D and docusate sodium. Additionally, I am having her maintain her triamterene-hydrochlorothiazide, traZODone, lisinopril, FLUoxetine, and ferrous sulfate.  Meds ordered this encounter  Medications  . Cholecalciferol (VITAMIN D) 50 MCG (2000 UT) tablet    Sig: Take 1 tablet (2,000 Units total) by mouth daily.    Dispense:  90 tablet    Refill:  0    Order Specific Question:   Supervising Provider    Answer:   MATTHEWS, CODY [[id number]]  . lisinopril (ZESTRIL) 10 MG tablet    Sig: Take 1 tablet (10 mg total) by mouth daily.    Dispense:  90 tablet    Refill:  1    Order Specific Question:   Supervising Provider    Answer:   MATTHEWS, CODY [[id number]]  . FLUoxetine (PROZAC) 20 MG tablet    Sig: Take 1 tablet (20 mg total) by mouth daily.    Dispense:  90 tablet    Refill:  1    Order Specific Question:   Supervising Provider    Answer:   MATTHEWS,  CODY [[id number]]  . levothyroxine (SYNTHROID) 125 MCG tablet    Sig: Take 1 tablet (125 mcg total) by mouth daily before breakfast.    Dispense:  90 tablet    Refill:  0    Order Specific Question:   Supervising Provider    Answer:   Lucille Passy [[id number]]  . docusate sodium (COLACE) 100 MG capsule    Sig: Take 1 capsule (100 mg total) by mouth 2 (two) times daily.    Dispense:  180 capsule    Refill:  1    Order Specific Question:   Supervising Provider    Answer:   Lucille Passy [[id number]]  . ferrous sulfate 325 (65 FE) MG tablet    Sig: Take 1 tablet (325 mg total) by mouth 2 (two) times daily with a meal.    Dispense:  180 tablet    Refill:  1    Order Specific Question:   Supervising Provider    Answer:   Lucille Passy [[id number]]    Problem List Items Addressed This Visit      Cardiovascular and Mediastinum   Essential hypertension - Primary    At goal with lisinopril and maxzide.  BP Readings from Last 3 Encounters:  07/05/18 124/72  06/03/18 120/74  05/06/18 (!) 144/94        Relevant Medications   lisinopril (ZESTRIL) 10 MG tablet     Endocrine   Hypothyroidism    Thyroid US: chronic thyroiditis. Entered referral to endocrinology. Repeat TSH and T4 today      Relevant Medications   levothyroxine (SYNTHROID) 125 MCG tablet  Other Relevant Orders   TSH (Completed)   T4, free (Completed)     Other   Depression, major, single episode, severe ([place])    Improved mood with prozac. Use of trazodone prn for sleep. Entered referral for counselling  Depression screen [facility] 2/9 07/05/2018 06/03/2018 03/22/2018  Decreased Interest 1 2 2   Down, Depressed, Hopeless 1 1 3   PHQ - 2 Score 2 3 5   Altered sleeping 1 3 3   Tired, decreased energy 1 1 2   Change in appetite 1 0 1  Feeling bad or failure about yourself  1 1 3   Trouble concentrating 1 3 3   Moving slowly or fidgety/restless 0 0 0  Suicidal thoughts 0 1 2  PHQ-9 Score 7 12 19    GAD 7 : Generalized Anxiety Score 07/05/2018  06/03/2018 03/22/2018  Nervous, Anxious, on Edge 1 3 2   Control/stop worrying 1 2 3   Worry too much - different things 1 2 3   Trouble relaxing 1 1 1   Restless 0 0 0  Easily annoyed or irritable 1 2 0  Afraid - awful might happen 1 1 3   Total GAD 7 Score 6 11 12          Relevant Medications   FLUoxetine (PROZAC) 20 MG tablet   Other Relevant Orders   Ambulatory referral to Psychology   GAD (generalized anxiety disorder)   Relevant Medications   FLUoxetine (PROZAC) 20 MG tablet   Other Relevant Orders   Ambulatory referral to Psychology   Hyperglycemia   Relevant Orders   Hemoglobin A1c (Completed)   Hyperlipidemia   Relevant Medications   lisinopril (ZESTRIL) 10 MG tablet   Other Relevant Orders   Lipid panel (Completed)   Iron deficiency anemia due to chronic blood loss   Relevant Medications   docusate sodium (COLACE) 100 MG capsule   ferrous sulfate 325 (65 FE) MG tablet   Other Relevant Orders   Iron, TIBC and Ferritin Panel (Completed)   CBC w/Diff (Completed)   Morbid obesity (HCC)   Relevant Medications   Cholecalciferol (VITAMIN D) 50 MCG (2000 UT) tablet   Other Relevant Orders   Amb Ref to Medical Weight Management    Other Visit Diagnoses    Screen for STD (sexually transmitted disease)       Relevant Orders   B-HCG Quant (Completed)   HIV antibody (with reflex) (Completed)   RPR (Completed)      Follow-up: Return in about 3 months (around 10/05/2018) for DM and HTN, hyperlipidemia (fasting).  Wilfred Lacy, NP

## 2018-07-05 NOTE — Assessment & Plan Note (Signed)
Thyroid US: chronic thyroiditis. Entered referral to endocrinology. Repeat TSH and T4 today

## 2018-07-05 NOTE — Assessment & Plan Note (Signed)
Improved mood with prozac. Use of trazodone prn for sleep. Entered referral for counselling  Depression screen [facility] 2/9 07/05/2018 06/03/2018 03/22/2018  Decreased Interest 1 2 2   Down, Depressed, Hopeless 1 1 3   PHQ - 2 Score 2 3 5   Altered sleeping 1 3 3   Tired, decreased energy 1 1 2   Change in appetite 1 0 1  Feeling bad or failure about yourself  1 1 3   Trouble concentrating 1 3 3   Moving slowly or fidgety/restless 0 0 0  Suicidal thoughts 0 1 2  PHQ-9 Score 7 12 19    GAD 7 : Generalized Anxiety Score 07/05/2018 06/03/2018 03/22/2018  Nervous, Anxious, on Edge 1 3 2   Control/stop worrying 1 2 3   Worry too much - different things 1 2 3   Trouble relaxing 1 1 1   Restless 0 0 0  Easily annoyed or irritable 1 2 0  Afraid - awful might happen 1 1 3   Total GAD 7 Score 6 11 12

## 2018-07-05 NOTE — Assessment & Plan Note (Signed)
At goal with lisinopril and maxzide.  BP Readings from Last 3 Encounters:  07/05/18 124/72  06/03/18 120/74  05/06/18 (!) 144/94

## 2018-07-05 NOTE — Patient Instructions (Addendum)
Stable cbc Pending urine cytology. Iron panel indicates iron deficiency. Start ferrous sulfate 325mg  BID with food. Also start colace 100mg  BID to prevent constipation caused by ferrous sulfate  improving thyroid and lipid panel. Increase levothyroxine dose to 164mcg Worsening hgbA1c 6.1 to 6.5.  Will you be interested in using saxenda injection (daily) to improve glucose control and promote weight loss?  You will be contacted to schedule appt with endocrinology, weight loss clinic and counselor.  Calorie Counting for Weight Loss Calories are units of energy. Your body needs a certain amount of calories from food to keep you going throughout the day. When you eat more calories than your body needs, your body stores the extra calories as fat. When you eat fewer calories than your body needs, your body burns fat to get the energy it needs. Calorie counting means keeping track of how many calories you eat and drink each day. Calorie counting can be helpful if you need to lose weight. If you make sure to eat fewer calories than your body needs, you should lose weight. Ask your health care provider what a healthy weight is for you. For calorie counting to work, you will need to eat the right number of calories in a day in order to lose a healthy amount of weight per week. A dietitian can help you determine how many calories you need in a day and will give you suggestions on how to reach your calorie goal.  A healthy amount of weight to lose per week is usually 1-2 lb (0.5-0.9 kg). This usually means that your daily calorie intake should be reduced by 500-750 calories.  Eating 1,200 - 1,500 calories per day can help most women lose weight.  Eating 1,500 - 1,800 calories per day can help most men lose weight. What is my plan? My goal is to have __1800________ calories per day. If I have this many calories per day, I should lose around _____0.5_____ pounds per week. What do I need to know about  calorie counting? In order to meet your daily calorie goal, you will need to:  Find out how many calories are in each food you would like to eat. Try to do this before you eat.  Decide how much of the food you plan to eat.  Write down what you ate and how many calories it had. Doing this is called keeping a food log. To successfully lose weight, it is important to balance calorie counting with a healthy lifestyle that includes regular activity. Aim for 150 minutes of moderate exercise (such as walking) or 75 minutes of vigorous exercise (such as running) each week. Where do I find calorie information?  The number of calories in a food can be found on a Nutrition Facts label. If a food does not have a Nutrition Facts label, try to look up the calories online or ask your dietitian for help. Remember that calories are listed per serving. If you choose to have more than one serving of a food, you will have to multiply the calories per serving by the amount of servings you plan to eat. For example, the label on a package of bread might say that a serving size is 1 slice and that there are 90 calories in a serving. If you eat 1 slice, you will have eaten 90 calories. If you eat 2 slices, you will have eaten 180 calories. How do I keep a food log? Immediately after each meal, record the following information in  your food log:  What you ate. Don't forget to include toppings, sauces, and other extras on the food.  How much you ate. This can be measured in cups, ounces, or number of items.  How many calories each food and drink had.  The total number of calories in the meal. Keep your food log near you, such as in a small notebook in your pocket, or use a mobile app or website. Some programs will calculate calories for you and show you how many calories you have left for the day to meet your goal. What are some calorie counting tips?   Use your calories on foods and drinks that will fill you up and  not leave you hungry: ? Some examples of foods that fill you up are nuts and nut butters, vegetables, lean proteins, and high-fiber foods like whole grains. High-fiber foods are foods with more than 5 g fiber per serving. ? Drinks such as sodas, specialty coffee drinks, alcohol, and juices have a lot of calories, yet do not fill you up.  Eat nutritious foods and avoid empty calories. Empty calories are calories you get from foods or beverages that do not have many vitamins or protein, such as candy, sweets, and soda. It is better to have a nutritious high-calorie food (such as an avocado) than a food with few nutrients (such as a bag of chips).  Know how many calories are in the foods you eat most often. This will help you calculate calorie counts faster.  Pay attention to calories in drinks. Low-calorie drinks include water and unsweetened drinks.  Pay attention to nutrition labels for "low fat" or "fat free" foods. These foods sometimes have the same amount of calories or more calories than the full fat versions. They also often have added sugar, starch, or salt, to make up for flavor that was removed with the fat.  Find a way of tracking calories that works for you. Get creative. Try different apps or programs if writing down calories does not work for you. What are some portion control tips?  Know how many calories are in a serving. This will help you know how many servings of a certain food you can have.  Use a measuring cup to measure serving sizes. You could also try weighing out portions on a kitchen scale. With time, you will be able to estimate serving sizes for some foods.  Take some time to put servings of different foods on your favorite plates, bowls, and cups so you know what a serving looks like.  Try not to eat straight from a bag or box. Doing this can lead to overeating. Put the amount you would like to eat in a cup or on a plate to make sure you are eating the right  portion.  Use smaller plates, glasses, and bowls to prevent overeating.  Try not to multitask (for example, watch TV or use your computer) while eating. If it is time to eat, sit down at a table and enjoy your food. This will help you to know when you are full. It will also help you to be aware of what you are eating and how much you are eating. What are tips for following this plan? Reading food labels  Check the calorie count compared to the serving size. The serving size may be smaller than what you are used to eating.  Check the source of the calories. Make sure the food you are eating is high in vitamins  and protein and low in saturated and trans fats. Shopping  Read nutrition labels while you shop. This will help you make healthy decisions before you decide to purchase your food.  Make a grocery list and stick to it. Cooking  Try to cook your favorite foods in a healthier way. For example, try baking instead of frying.  Use low-fat dairy products. Meal planning  Use more fruits and vegetables. Half of your plate should be fruits and vegetables.  Include lean proteins like poultry and fish. How do I count calories when eating out?  Ask for smaller portion sizes.  Consider sharing an entree and sides instead of getting your own entree.  If you get your own entree, eat only half. Ask for a box at the beginning of your meal and put the rest of your entree in it so you are not tempted to eat it.  If calories are listed on the menu, choose the lower calorie options.  Choose dishes that include vegetables, fruits, whole grains, low-fat dairy products, and lean protein.  Choose items that are boiled, broiled, grilled, or steamed. Stay away from items that are buttered, battered, fried, or served with cream sauce. Items labeled "crispy" are usually fried, unless stated otherwise.  Choose water, low-fat milk, unsweetened iced tea, or other drinks without added sugar. If you want  an alcoholic beverage, choose a lower calorie option such as a glass of wine or light beer.  Ask for dressings, sauces, and syrups on the side. These are usually high in calories, so you should limit the amount you eat.  If you want a salad, choose a garden salad and ask for grilled meats. Avoid extra toppings like bacon, cheese, or fried items. Ask for the dressing on the side, or ask for olive oil and vinegar or lemon to use as dressing.  Estimate how many servings of a food you are given. For example, a serving of cooked rice is  cup or about the size of half a baseball. Knowing serving sizes will help you be aware of how much food you are eating at restaurants. The list below tells you how big or small some common portion sizes are based on everyday objects: ? 1 oz-4 stacked dice. ? 3 oz-1 deck of cards. ? 1 tsp-1 die. ? 1 Tbsp- a ping-pong ball. ? 2 Tbsp-1 ping-pong ball. ?  cup- baseball. ? 1 cup-1 baseball. Summary  Calorie counting means keeping track of how many calories you eat and drink each day. If you eat fewer calories than your body needs, you should lose weight.  A healthy amount of weight to lose per week is usually 1-2 lb (0.5-0.9 kg). This usually means reducing your daily calorie intake by 500-750 calories.  The number of calories in a food can be found on a Nutrition Facts label. If a food does not have a Nutrition Facts label, try to look up the calories online or ask your dietitian for help.  Use your calories on foods and drinks that will fill you up, and not on foods and drinks that will leave you hungry.  Use smaller plates, glasses, and bowls to prevent overeating. This information is not intended to replace advice given to you by your health care provider. Make sure you discuss any questions you have with your health care provider. Document Released: 12/22/2004 Document Revised: 09/10/2017 Document Reviewed: 11/22/2015 Elsevier Patient Education  2020  [employer].

## 2018-07-06 ENCOUNTER — Other Ambulatory Visit (INDEPENDENT_AMBULATORY_CARE_PROVIDER_SITE_OTHER): Payer: 59

## 2018-07-06 ENCOUNTER — Encounter: Payer: Self-pay | Admitting: Nurse Practitioner

## 2018-07-06 ENCOUNTER — Other Ambulatory Visit (HOSPITAL_COMMUNITY)
Admission: RE | Admit: 2018-07-06 | Discharge: 2018-07-06 | Disposition: A | Discharge status: Home / Self Care | Payer: 59 | Source: Ambulatory Visit | Attending: Nurse Practitioner | Admitting: Nurse Practitioner

## 2018-07-06 DIAGNOSIS — R739 Hyperglycemia, unspecified: Secondary | ICD-10-CM | POA: Diagnosis not present

## 2018-07-06 DIAGNOSIS — A5901 Trichomonal vulvovaginitis: Secondary | ICD-10-CM

## 2018-07-06 DIAGNOSIS — E782 Mixed hyperlipidemia: Secondary | ICD-10-CM

## 2018-07-06 DIAGNOSIS — E039 Hypothyroidism, unspecified: Secondary | ICD-10-CM

## 2018-07-06 DIAGNOSIS — Z113 Encounter for screening for infections with a predominantly sexual mode of transmission: Secondary | ICD-10-CM

## 2018-07-06 DIAGNOSIS — D500 Iron deficiency anemia secondary to blood loss (chronic): Secondary | ICD-10-CM

## 2018-07-06 LAB — CBC WITH DIFFERENTIAL/PLATELET
Basophils Absolute: 0 10*3/uL (ref 0.0–0.1)
Basophils Relative: 0.4 % (ref 0.0–3.0)
Eosinophils Absolute: 0.1 10*3/uL (ref 0.0–0.7)
Eosinophils Relative: 1.9 % (ref 0.0–5.0)
HCT: 33.3 % — ABNORMAL LOW (ref 36.0–46.0)
Hemoglobin: 10.6 g/dL — ABNORMAL LOW (ref 12.0–15.0)
Lymphocytes Relative: 24.4 % (ref 12.0–46.0)
Lymphs Abs: 1.9 10*3/uL (ref 0.7–4.0)
MCHC: 31.8 g/dL (ref 30.0–36.0)
MCV: 82.3 fl (ref 78.0–100.0)
Monocytes Absolute: 0.5 10*3/uL (ref 0.1–1.0)
Monocytes Relative: 7.1 % (ref 3.0–12.0)
Neutro Abs: 5 10*3/uL (ref 1.4–7.7)
Neutrophils Relative %: 66.2 % (ref 43.0–77.0)
Platelets: 280 10*3/uL (ref 150.0–400.0)
RBC: 4.05 Mil/uL (ref 3.87–5.11)
RDW: 15.4 % (ref 11.5–15.5)
WBC: 7.6 10*3/uL (ref 4.0–10.5)

## 2018-07-06 LAB — HEMOGLOBIN A1C: Hgb A1c MFr Bld: 6.5 % (ref 4.6–6.5)

## 2018-07-06 LAB — LIPID PANEL
Cholesterol: 194 mg/dL (ref 0–200)
HDL: 35.6 mg/dL — ABNORMAL LOW (ref >39.00)
LDL Cholesterol: 119 mg/dL — ABNORMAL HIGH (ref 0–99)
NonHDL: 158.4
Total CHOL/HDL Ratio: 5
Triglycerides: 198 mg/dL — ABNORMAL HIGH (ref 0.0–149.0)
VLDL: 39.6 mg/dL (ref 0.0–40.0)

## 2018-07-06 LAB — T4, FREE: Free T4: 0.39 ng/dL — ABNORMAL LOW (ref 0.60–1.60)

## 2018-07-06 LAB — TSH: TSH: 47.4 u[IU]/mL — ABNORMAL HIGH (ref 0.35–4.50)

## 2018-07-06 LAB — HCG, QUANTITATIVE, PREGNANCY: Quantitative HCG: 0.95 m[IU]/mL

## 2018-07-06 MED ORDER — LEVOTHYROXINE SODIUM 125 MCG PO TABS: 125.0000 ug | ORAL_TABLET | Freq: Every day | ORAL | 0 refills | Status: DC

## 2018-07-06 MED ORDER — METFORMIN HCL ER 750 MG PO TB24: 750.0000 mg | ORAL_TABLET | Freq: Every day | ORAL | 1 refills | Status: DC

## 2018-07-06 NOTE — Addendum Note (Signed)
Addended by: Wilfred Lacy L on: 07/06/2018 04:10 PM   Modules accepted: Orders

## 2018-07-07 LAB — URINE CYTOLOGY ANCILLARY ONLY
Chlamydia: NEGATIVE
Neisseria Gonorrhea: NEGATIVE
Trichomonas: NEGATIVE

## 2018-07-07 LAB — IRON,TIBC AND FERRITIN PANEL
%SAT: 8 % (calc) — ABNORMAL LOW (ref 16–45)
Ferritin: 8 ng/mL — ABNORMAL LOW (ref 16–232)
Iron: 30 ug/dL — ABNORMAL LOW (ref 40–190)
TIBC: 380 mcg/dL (calc) (ref 250–450)

## 2018-07-07 LAB — RPR: RPR Ser Ql: NONREACTIVE

## 2018-07-07 LAB — HIV ANTIBODY (ROUTINE TESTING W REFLEX): HIV 1&2 Ab, 4th Generation: NONREACTIVE

## 2018-07-07 MED ORDER — DOCUSATE SODIUM 100 MG PO CAPS: 100.0000 mg | ORAL_CAPSULE | Freq: Two times a day (BID) | ORAL | 1 refills | Status: DC

## 2018-07-07 MED ORDER — FERROUS SULFATE 325 (65 FE) MG PO TABS: 325.0000 mg | ORAL_TABLET | Freq: Two times a day (BID) | ORAL | 1 refills | Status: DC

## 2018-07-07 NOTE — Addendum Note (Signed)
Addended by: Wilfred Lacy L on: 07/07/2018 02:24 PM   Modules accepted: Orders

## 2018-08-25 ENCOUNTER — Encounter: Payer: Self-pay | Admitting: Internal Medicine

## 2018-10-04 ENCOUNTER — Ambulatory Visit: Payer: 59 | Admitting: Nurse Practitioner

## 2018-10-06 ENCOUNTER — Other Ambulatory Visit: Payer: Self-pay | Admitting: Nurse Practitioner

## 2018-10-06 DIAGNOSIS — I10 Essential (primary) hypertension: Secondary | ICD-10-CM

## 2018-10-09 ENCOUNTER — Other Ambulatory Visit: Payer: Self-pay | Admitting: Nurse Practitioner

## 2018-10-09 DIAGNOSIS — R739 Hyperglycemia, unspecified: Secondary | ICD-10-CM

## 2018-10-10 ENCOUNTER — Ambulatory Visit: Payer: 59 | Admitting: Nurse Practitioner

## 2018-10-26 ENCOUNTER — Ambulatory Visit: Payer: 59 | Admitting: Nurse Practitioner

## 2018-11-04 ENCOUNTER — Other Ambulatory Visit: Payer: Self-pay

## 2018-11-04 ENCOUNTER — Encounter: Payer: Self-pay | Admitting: Nurse Practitioner

## 2018-11-04 ENCOUNTER — Ambulatory Visit: Payer: 59 | Admitting: Nurse Practitioner

## 2018-11-04 VITALS — BP 130/72 | HR 76 | Temp 97.7°F | Ht 66.5 in | Wt 351.4 lb

## 2018-11-04 DIAGNOSIS — F322 Major depressive disorder, single episode, severe without psychotic features: Secondary | ICD-10-CM

## 2018-11-04 DIAGNOSIS — E876 Hypokalemia: Secondary | ICD-10-CM

## 2018-11-04 DIAGNOSIS — D500 Iron deficiency anemia secondary to blood loss (chronic): Secondary | ICD-10-CM | POA: Diagnosis not present

## 2018-11-04 DIAGNOSIS — R739 Hyperglycemia, unspecified: Secondary | ICD-10-CM

## 2018-11-04 DIAGNOSIS — E1165 Type 2 diabetes mellitus with hyperglycemia: Secondary | ICD-10-CM | POA: Diagnosis not present

## 2018-11-04 DIAGNOSIS — F411 Generalized anxiety disorder: Secondary | ICD-10-CM

## 2018-11-04 DIAGNOSIS — I10 Essential (primary) hypertension: Secondary | ICD-10-CM | POA: Diagnosis not present

## 2018-11-04 DIAGNOSIS — E039 Hypothyroidism, unspecified: Secondary | ICD-10-CM | POA: Diagnosis not present

## 2018-11-04 DIAGNOSIS — T502X5A Adverse effect of carbonic-anhydrase inhibitors, benzothiadiazides and other diuretics, initial encounter: Secondary | ICD-10-CM

## 2018-11-04 LAB — T4, FREE: Free T4: 0.57 ng/dL — ABNORMAL LOW (ref 0.60–1.60)

## 2018-11-04 LAB — BASIC METABOLIC PANEL
BUN: 15 mg/dL (ref 6–23)
CO2: 24 mEq/L (ref 19–32)
Calcium: 8.7 mg/dL (ref 8.4–10.5)
Chloride: 103 mEq/L (ref 96–112)
Creatinine, Ser: 0.76 mg/dL (ref 0.40–1.20)
GFR: 81.58 mL/min (ref >60.00)
Glucose, Bld: 167 mg/dL — ABNORMAL HIGH (ref 70–99)
Potassium: 4 mEq/L (ref 3.5–5.1)
Sodium: 138 mEq/L (ref 135–145)

## 2018-11-04 LAB — HEMOGLOBIN A1C: Hgb A1c MFr Bld: 7.4 % — ABNORMAL HIGH (ref 4.6–6.5)

## 2018-11-04 LAB — TSH: TSH: 38.32 u[IU]/mL — ABNORMAL HIGH (ref 0.35–4.50)

## 2018-11-04 MED ORDER — VENLAFAXINE HCL 25 MG PO TABS: 25.0000 mg | ORAL_TABLET | Freq: Two times a day (BID) | ORAL | 2 refills | Status: DC

## 2018-11-04 MED ORDER — LISINOPRIL 10 MG PO TABS: 10.0000 mg | ORAL_TABLET | Freq: Every day | ORAL | 3 refills | Status: DC

## 2018-11-04 MED ORDER — FLUOXETINE HCL 20 MG PO TABS: 20.0000 mg | ORAL_TABLET | Freq: Every day | ORAL | 1 refills | Status: DC

## 2018-11-04 MED ORDER — VITAMIN D 50 MCG (2000 UT) PO TABS: 2000.0000 [IU] | ORAL_TABLET | Freq: Every day | ORAL | 3 refills | Status: DC

## 2018-11-04 NOTE — Progress Notes (Signed)
Subjective:  Patient ID: Mary Moyer, female    DOB: Sep 07, 1970  Age: 48 y.o. MRN: [id number]  CC: Follow-up (3 follow up--fasting)  HPI  Anxiety and Depression. Stable mood with prozac. Reports lack of motivation.  Unable to schedule appt with psychiatry and/or therapy due to financial difficulty. denies any SI/HI.  HTN: Stable with lisinopril and maxizide. BP Readings from Last 3 Encounters:  11/04/18 130/72  07/05/18 124/72  06/03/18 120/74   Hypothyroidism: Persistent fatigue and lack of energy Needs repeat TSH and T4  Reviewed past Medical, Social and Family history today.  Outpatient Medications Prior to Visit  Medication Sig Dispense Refill  . OVER THE COUNTER MEDICATION Plant Force Liquid Iron    . Cholecalciferol (VITAMIN D) 50 MCG (2000 UT) tablet Take 1 tablet (2,000 Units total) by mouth daily. 90 tablet 0  . FLUoxetine (PROZAC) 20 MG tablet Take 1 tablet (20 mg total) by mouth daily. 90 tablet 1  . levothyroxine (SYNTHROID) 125 MCG tablet Take 1 tablet (125 mcg total) by mouth daily before breakfast. 90 tablet 0  . lisinopril (ZESTRIL) 10 MG tablet Take 1 tablet (10 mg total) by mouth daily. 90 tablet 1  . metFORMIN (GLUCOPHAGE-XR) 750 MG 24 hr tablet Take 1 tablet (750 mg total) by mouth daily with breakfast. 90 tablet 1  . traZODone (DESYREL) 50 MG tablet Take 1 tablet (50 mg total) by mouth at bedtime. 30 tablet 0  . triamterene-hydrochlorothiazide (MAXZIDE-25) 37.5-25 MG tablet Take 1 tablet by mouth daily. Need office visit for additional refills 90 tablet 0  . ferrous sulfate 325 (65 FE) MG tablet Take 1 tablet (325 mg total) by mouth 2 (two) times daily with a meal. (Patient not taking: Reported on 11/04/2018) 180 tablet 1  . docusate sodium (COLACE) 100 MG capsule Take 1 capsule (100 mg total) by mouth 2 (two) times daily. (Patient not taking: Reported on 11/04/2018) 180 capsule 1   No facility-administered medications prior to visit.     ROS See HPI   Objective:  BP 130/72   Pulse 76   Temp 97.7 F (36.5 C) (Tympanic)   Ht 5' 6.5" (1.689 m)   Wt (!) 351 lb 6.4 oz (159.4 kg)   SpO2 97%   BMI 55.87 kg/m   BP Readings from Last 3 Encounters:  11/04/18 130/72  07/05/18 124/72  06/03/18 120/74    Wt Readings from Last 3 Encounters:  11/04/18 (!) 351 lb 6.4 oz (159.4 kg)  07/05/18 (!) 347 lb 3.2 oz (157.5 kg)  06/03/18 (!) 337 lb (152.9 kg)    Physical Exam Vitals signs reviewed.  Constitutional:      Appearance: She is obese.  Cardiovascular:     Rate and Rhythm: Normal rate and regular rhythm.     Pulses: Normal pulses.     Heart sounds: Normal heart sounds.  Musculoskeletal:     Right lower leg: No edema.     Left lower leg: No edema.  Neurological:     Mental Status: She is alert and oriented to person, place, and time.    Lab Results  Component Value Date   WBC 7.6 07/06/2018   HGB 10.6 (L) 07/06/2018   HCT 33.3 (L) 07/06/2018   PLT 280.0 07/06/2018   GLUCOSE 167 (H) 11/04/2018   CHOL 194 07/06/2018   TRIG 198.0 (H) 07/06/2018   HDL 35.60 (L) 07/06/2018   LDLDIRECT 160.0 03/22/2018   LDLCALC 119 (H) 07/06/2018   ALT 22 03/22/2018  AST 19 03/22/2018   NA 138 11/04/2018   K 4.0 11/04/2018   CL 103 11/04/2018   CREATININE 0.76 11/04/2018   BUN 15 11/04/2018   CO2 24 11/04/2018   TSH 38.32 (H) 11/04/2018   HGBA1C 7.4 (H) 11/04/2018    No results found.  Assessment & Plan:   Mary Moyer was seen today for follow-up.  Diagnoses and all orders for this visit:  Essential hypertension -     Basic metabolic panel -     lisinopril (ZESTRIL) 10 MG tablet; Take 1 tablet (10 mg total) by mouth daily. -     triamterene-hydrochlorothiazide (MAXZIDE-25) 37.5-25 MG tablet; Take 1 tablet by mouth daily.  Iron deficiency anemia due to chronic blood loss -     Iron, TIBC and Ferritin Panel  Diuretic-induced hypokalemia  Depression, major, single episode, severe (HCC) -     venlafaxine (EFFEXOR) 25 MG  tablet; Take 1 tablet (25 mg total) by mouth 2 (two) times daily with a meal. -     FLUoxetine (PROZAC) 20 MG tablet; Take 1 tablet (20 mg total) by mouth daily.  GAD (generalized anxiety disorder) -     venlafaxine (EFFEXOR) 25 MG tablet; Take 1 tablet (25 mg total) by mouth 2 (two) times daily with a meal. -     FLUoxetine (PROZAC) 20 MG tablet; Take 1 tablet (20 mg total) by mouth daily.  Morbid obesity ([place]) -     Hemoglobin A1c -     Cholecalciferol (VITAMIN D) 50 MCG (2000 UT) tablet; Take 1 tablet (2,000 Units total) by mouth daily.  Type 2 diabetes mellitus with hyperglycemia, without long-term current use of insulin (HCC) -     Hemoglobin A1c -     metFORMIN (GLUCOPHAGE) 850 MG tablet; Take 1 tablet (850 mg total) by mouth 2 (two) times daily with a meal.  Hypothyroidism, unspecified type -     TSH -     T4, free -     levothyroxine (SYNTHROID) 175 MCG tablet; Take 1 tablet (175 mcg total) by mouth daily before breakfast.   I have discontinued Mary Moyer's traZODone, metFORMIN, and docusate sodium. I have also changed her levothyroxine and triamterene-hydrochlorothiazide. Additionally, I am having her start on venlafaxine and metFORMIN. Lastly, I am having her maintain her ferrous sulfate, OVER THE COUNTER MEDICATION, FLUoxetine, Vitamin D, and lisinopril.  Meds ordered this encounter  Medications  . venlafaxine (EFFEXOR) 25 MG tablet    Sig: Take 1 tablet (25 mg total) by mouth 2 (two) times daily with a meal.    Dispense:  60 tablet    Refill:  2    Order Specific Question:   Supervising Provider    Answer:   MATTHEWS, CODY [[id number]]  . FLUoxetine (PROZAC) 20 MG tablet    Sig: Take 1 tablet (20 mg total) by mouth daily.    Dispense:  90 tablet    Refill:  1    Order Specific Question:   Supervising Provider    Answer:   MATTHEWS, CODY [[id number]]  . Cholecalciferol (VITAMIN D) 50 MCG (2000 UT) tablet    Sig: Take 1 tablet (2,000 Units total) by mouth daily.    Dispense:   90 tablet    Refill:  3    Order Specific Question:   Supervising Provider    Answer:   MATTHEWS, CODY [[id number]]  . lisinopril (ZESTRIL) 10 MG tablet    Sig: Take 1 tablet (10 mg total) by mouth daily.  Dispense:  90 tablet    Refill:  3    Order Specific Question:   Supervising Provider    Answer:   MATTHEWS, CODY [[id number]]  . levothyroxine (SYNTHROID) 175 MCG tablet    Sig: Take 1 tablet (175 mcg total) by mouth daily before breakfast.    Dispense:  90 tablet    Refill:  1    Order Specific Question:   Supervising Provider    Answer:   Lucille Passy [[id number]]  . metFORMIN (GLUCOPHAGE) 850 MG tablet    Sig: Take 1 tablet (850 mg total) by mouth 2 (two) times daily with a meal.    Dispense:  180 tablet    Refill:  1    Order Specific Question:   Supervising Provider    Answer:   Lucille Passy [[id number]]  . triamterene-hydrochlorothiazide (MAXZIDE-25) 37.5-25 MG tablet    Sig: Take 1 tablet by mouth daily.    Dispense:  90 tablet    Refill:  1    Order Specific Question:   Supervising Provider    Answer:   Lucille Passy [[id number]]    Problem List Items Addressed This Visit      Cardiovascular and Mediastinum   Essential hypertension - Primary   Relevant Medications   lisinopril (ZESTRIL) 10 MG tablet   triamterene-hydrochlorothiazide (MAXZIDE-25) 37.5-25 MG tablet   Other Relevant Orders   Basic metabolic panel (Completed)     Endocrine   DM (diabetes mellitus) ([place])    Worsening: hgbA1c 6.2 to 7.4 Increase metformin dose to 850mg  BID. She declined use of saxenda injection. Use of ACE, no neuropathy. BP at goal Needs urine microalbumin.  LDL not at goal, repeat lipid panel in 16months (fasting) Unable to afford appt with nutritionist. F/up in 51months.      Relevant Medications   lisinopril (ZESTRIL) 10 MG tablet   metFORMIN (GLUCOPHAGE) 850 MG tablet   Other Relevant Orders   Hemoglobin A1c (Completed)   Hypothyroidism   Relevant Medications   levothyroxine (SYNTHROID) 175  MCG tablet   Other Relevant Orders   TSH (Completed)   T4, free (Completed)     Other   Depression, major, single episode, severe (HCC)   Relevant Medications   venlafaxine (EFFEXOR) 25 MG tablet   FLUoxetine (PROZAC) 20 MG tablet   Diuretic-induced hypokalemia   GAD (generalized anxiety disorder)   Relevant Medications   venlafaxine (EFFEXOR) 25 MG tablet   FLUoxetine (PROZAC) 20 MG tablet   Iron deficiency anemia due to chronic blood loss   Relevant Orders   Iron, TIBC and Ferritin Panel (Completed)   Morbid obesity (HCC)   Relevant Medications   Cholecalciferol (VITAMIN D) 50 MCG (2000 UT) tablet   metFORMIN (GLUCOPHAGE) 850 MG tablet   Other Relevant Orders   Hemoglobin A1c (Completed)       Follow-up: Return in about 4 weeks (around 12/02/2018) for anxiety and depression (video, 41mins).  Wilfred Lacy, NP

## 2018-11-04 NOTE — Patient Instructions (Addendum)
Thyroid function continues to improve but not at goal. Increase levothyroxine dose to 146mcg. Improve iron level, but not at goal. Resume supplement BID with food, Worsening glucose control with hgbA1c at 7.4. changing metformin to 850mg  BID. Stable renal function F/up in 6months (fasting)  Continue prozac. Start effexor BID. You need to take ferrous sulfate twice a day with food. Start daily exercise (walking 37mins a day) Maintain DASH diet and no more than 1800calories per day.  DASH Eating Plan DASH stands for "Dietary Approaches to Stop Hypertension." The DASH eating plan is a healthy eating plan that has been shown to reduce high blood pressure (hypertension). It may also reduce your risk for type 2 diabetes, heart disease, and stroke. The DASH eating plan may also help with weight loss. What are tips for following this plan?  General guidelines  Avoid eating more than 2,300 mg (milligrams) of salt (sodium) a day. If you have hypertension, you may need to reduce your sodium intake to 1,500 mg a day.  Limit alcohol intake to no more than 1 drink a day for nonpregnant women and 2 drinks a day for men. One drink equals 12 oz of beer, 5 oz of wine, or 1 oz of hard liquor.  Work with your health care provider to maintain a healthy body weight or to lose weight. Ask what an ideal weight is for you.  Get at least 30 minutes of exercise that causes your heart to beat faster (aerobic exercise) most days of the week. Activities may include walking, swimming, or biking.  Work with your health care provider or diet and nutrition specialist (dietitian) to adjust your eating plan to your individual calorie needs. Reading food labels   Check food labels for the amount of sodium per serving. Choose foods with less than 5 percent of the Daily Value of sodium. Generally, foods with less than 300 mg of sodium per serving fit into this eating plan.  To find whole grains, look for the word "whole"  as the first word in the ingredient list. Shopping  Buy products labeled as "low-sodium" or "no salt added."  Buy fresh foods. Avoid canned foods and premade or frozen meals. Cooking  Avoid adding salt when cooking. Use salt-free seasonings or herbs instead of table salt or sea salt. Check with your health care provider or pharmacist before using salt substitutes.  Do not fry foods. Cook foods using healthy methods such as baking, boiling, grilling, and broiling instead.  Cook with heart-healthy oils, such as olive, canola, soybean, or sunflower oil. Meal planning  Eat a balanced diet that includes: ? 5 or more servings of fruits and vegetables each day. At each meal, try to fill half of your plate with fruits and vegetables. ? Up to 6-8 servings of whole grains each day. ? Less than 6 oz of lean meat, poultry, or fish each day. A 3-oz serving of meat is about the same size as a deck of cards. One egg equals 1 oz. ? 2 servings of low-fat dairy each day. ? A serving of nuts, seeds, or beans 5 times each week. ? Heart-healthy fats. Healthy fats called Omega-3 fatty acids are found in foods such as flaxseeds and coldwater fish, like sardines, salmon, and mackerel.  Limit how much you eat of the following: ? Canned or prepackaged foods. ? Food that is high in trans fat, such as fried foods. ? Food that is high in saturated fat, such as fatty meat. ? Sweets, desserts,  sugary drinks, and other foods with added sugar. ? Full-fat dairy products.  Do not salt foods before eating.  Try to eat at least 2 vegetarian meals each week.  Eat more home-cooked food and less restaurant, buffet, and fast food.  When eating at a restaurant, ask that your food be prepared with less salt or no salt, if possible. What foods are recommended? The items listed may not be a complete list. Talk with your dietitian about what dietary choices are best for you. Grains Whole-grain or whole-wheat bread.  Whole-grain or whole-wheat pasta. Brown rice. [place]. Bulgur. Whole-grain and low-sodium cereals. Pita bread. Low-fat, low-sodium crackers. Whole-wheat flour tortillas. Vegetables Fresh or frozen vegetables (raw, steamed, roasted, or grilled). Low-sodium or reduced-sodium tomato and vegetable juice. Low-sodium or reduced-sodium tomato sauce and tomato paste. Low-sodium or reduced-sodium canned vegetables. Fruits All fresh, dried, or frozen fruit. Canned fruit in natural juice (without added sugar). Meat and other protein foods Skinless chicken or [place]. Ground chicken or [place]. Pork with fat trimmed off. Fish and seafood. Egg whites. Dried beans, peas, or lentils. Unsalted nuts, nut butters, and seeds. Unsalted canned beans. Lean cuts of beef with fat trimmed off. Low-sodium, lean deli meat. Dairy Low-fat (1%) or fat-free (skim) milk. Fat-free, low-fat, or reduced-fat cheeses. Nonfat, low-sodium ricotta or cottage cheese. Low-fat or nonfat yogurt. Low-fat, low-sodium cheese. Fats and oils Soft margarine without trans fats. Vegetable oil. Low-fat, reduced-fat, or light mayonnaise and salad dressings (reduced-sodium). Canola, safflower, olive, soybean, and sunflower oils. Avocado. Seasoning and other foods Herbs. Spices. Seasoning mixes without salt. Unsalted popcorn and pretzels. Fat-free sweets. What foods are not recommended? The items listed may not be a complete list. Talk with your dietitian about what dietary choices are best for you. Grains Baked goods made with fat, such as croissants, muffins, or some breads. Dry pasta or rice meal packs. Vegetables Creamed or fried vegetables. Vegetables in a cheese sauce. Regular canned vegetables (not low-sodium or reduced-sodium). Regular canned tomato sauce and paste (not low-sodium or reduced-sodium). Regular tomato and vegetable juice (not low-sodium or reduced-sodium). Angie Fava. Olives. Fruits Canned fruit in a light or heavy syrup.  Fried fruit. Fruit in cream or butter sauce. Meat and other protein foods Fatty cuts of meat. Ribs. Fried meat. Berniece Salines. Sausage. Bologna and other processed lunch meats. Salami. Fatback. Hotdogs. Bratwurst. Salted nuts and seeds. Canned beans with added salt. Canned or smoked fish. Whole eggs or egg yolks. Chicken or [place] with skin. Dairy Whole or 2% milk, cream, and half-and-half. Whole or full-fat cream cheese. Whole-fat or sweetened yogurt. Full-fat cheese. Nondairy creamers. Whipped toppings. Processed cheese and cheese spreads. Fats and oils Butter. Stick margarine. Lard. Shortening. Ghee. Bacon fat. Tropical oils, such as coconut, palm kernel, or palm oil. Seasoning and other foods Salted popcorn and pretzels. Onion salt, garlic salt, seasoned salt, table salt, and sea salt. Worcestershire sauce. Tartar sauce. Barbecue sauce. Teriyaki sauce. Soy sauce, including reduced-sodium. Steak sauce. Canned and packaged gravies. Fish sauce. Oyster sauce. Cocktail sauce. Horseradish that you find on the shelf. Ketchup. Mustard. Meat flavorings and tenderizers. Bouillon cubes. Hot sauce and Tabasco sauce. Premade or packaged marinades. Premade or packaged taco seasonings. Relishes. Regular salad dressings. Where to find more information:  National Heart, Lung, and [place]: https://wilson-eaton.com/  American Heart Association: www.heart.org Summary  The DASH eating plan is a healthy eating plan that has been shown to reduce high blood pressure (hypertension). It may also reduce your risk for type 2 diabetes, heart disease,  and stroke.  With the DASH eating plan, you should limit salt (sodium) intake to 2,300 mg a day. If you have hypertension, you may need to reduce your sodium intake to 1,500 mg a day.  When on the DASH eating plan, aim to eat more fresh fruits and vegetables, whole grains, lean proteins, low-fat dairy, and heart-healthy fats.  Work with your health care provider or diet and  nutrition specialist (dietitian) to adjust your eating plan to your individual calorie needs. This information is not intended to replace advice given to you by your health care provider. Make sure you discuss any questions you have with your health care provider. Document Released: 12/11/2010 Document Revised: 12/04/2016 Document Reviewed: 12/16/2015 Elsevier Patient Education  2020 [employer].

## 2018-11-05 LAB — IRON,TIBC AND FERRITIN PANEL
%SAT: 14 % (calc) — ABNORMAL LOW (ref 16–45)
Ferritin: 16 ng/mL (ref 16–232)
Iron: 57 ug/dL (ref 40–190)
TIBC: 397 mcg/dL (calc) (ref 250–450)

## 2018-11-07 MED ORDER — LEVOTHYROXINE SODIUM 175 MCG PO TABS: 175.0000 ug | ORAL_TABLET | Freq: Every day | ORAL | 1 refills | Status: DC

## 2018-11-07 MED ORDER — METFORMIN HCL 850 MG PO TABS: 850.0000 mg | ORAL_TABLET | Freq: Two times a day (BID) | ORAL | 1 refills | Status: DC

## 2018-11-07 MED ORDER — TRIAMTERENE-HCTZ 37.5-25 MG PO TABS: 1.0000 | ORAL_TABLET | Freq: Every day | ORAL | 1 refills | Status: DC

## 2018-11-07 NOTE — Assessment & Plan Note (Addendum)
Worsening: hgbA1c 6.2 to 7.4 Increase metformin dose to 850mg  BID. She declined use of saxenda injection. Use of ACE, no neuropathy. BP at goal Needs urine microalbumin.  LDL not at goal, repeat lipid panel in 59months (fasting) Unable to afford appt with nutritionist. F/up in 46months.

## 2019-03-13 ENCOUNTER — Telehealth: Payer: Self-pay | Admitting: Nurse Practitioner

## 2019-03-13 DIAGNOSIS — E039 Hypothyroidism, unspecified: Secondary | ICD-10-CM

## 2019-03-13 NOTE — Telephone Encounter (Signed)
Patient is calling and requesting a refill for Levothyroxine sent to [facility] on [address]. Also patient stated that she will not have any health insurance and will not be able to come in at this time. CB is [phone]

## 2019-03-13 NOTE — Telephone Encounter (Signed)
Mary Moyer please advise, last ov was 10/2018. Ok to send in rx, #? Due to insurance issue.

## 2019-03-14 MED ORDER — LEVOTHYROXINE SODIUM 175 MCG PO TABS: 175.0000 ug | ORAL_TABLET | Freq: Every day | ORAL | 0 refills | Status: DC

## 2019-03-14 NOTE — Telephone Encounter (Signed)
Ok to send 90tabs, no additional refill without office visit and repeat labs

## 2019-03-14 NOTE — Telephone Encounter (Signed)
Pt is aware, rx sent 

## 2019-03-24 ENCOUNTER — Ambulatory Visit: Payer: Self-pay | Attending: Internal Medicine

## 2019-03-24 DIAGNOSIS — Z23 Encounter for immunization: Secondary | ICD-10-CM

## 2019-03-24 NOTE — Progress Notes (Signed)
   Covid-19 Vaccination Clinic  Name:  Mary Moyer    MRN: [id number] DOB: 01-Nov-1970  03/24/2019  [name] was observed post Covid-19 immunization for 15 minutes without incident. She was provided with Vaccine Information Sheet and instruction to access the V-Safe system.   [name] was instructed to call 911 with any severe reactions post vaccine: Marland Kitchen Difficulty breathing  . Swelling of face and throat  . A fast heartbeat  . A bad rash all over body  . Dizziness and weakness   Immunizations Administered    Name Date Dose VIS Date Route   Pfizer COVID-19 Vaccine 03/24/2019 11:51 AM 0.3 mL 12/16/2018 Intramuscular   Manufacturer: [place]   Lot: [id number]   [place]: [id number]

## 2019-04-19 ENCOUNTER — Ambulatory Visit: Payer: Self-pay | Attending: Internal Medicine

## 2019-04-19 DIAGNOSIS — Z23 Encounter for immunization: Secondary | ICD-10-CM

## 2019-04-19 NOTE — Progress Notes (Signed)
   Covid-19 Vaccination Clinic  Name:  Ruthellen Bemiss    MRN: [id number] DOB: 10-20-1970  04/19/2019  Ms. Escutia was observed post Covid-19 immunization for 15 minutes without incident. She was provided with Vaccine Information Sheet and instruction to access the V-Safe system.   Ms. Mcelhenney was instructed to call 911 with any severe reactions post vaccine: Marland Kitchen Difficulty breathing  . Swelling of face and throat  . A fast heartbeat  . A bad rash all over body  . Dizziness and weakness   Immunizations Administered    Name Date Dose VIS Date Route   Pfizer COVID-19 Vaccine 04/19/2019  8:20 AM 0.3 mL 12/16/2018 Intramuscular   Manufacturer: [place]   Lot: [id number]   [place]: [id number]

## 2019-06-09 DIAGNOSIS — E119 Type 2 diabetes mellitus without complications: Secondary | ICD-10-CM | POA: Diagnosis not present

## 2019-06-09 LAB — HM DIABETES EYE EXAM

## 2019-07-27 ENCOUNTER — Other Ambulatory Visit: Payer: Self-pay

## 2019-07-28 ENCOUNTER — Other Ambulatory Visit (HOSPITAL_COMMUNITY)
Admission: RE | Admit: 2019-07-28 | Discharge: 2019-07-28 | Disposition: A | Discharge status: Home / Self Care | Payer: BC Managed Care – PPO | Source: Ambulatory Visit | Attending: Nurse Practitioner | Admitting: Nurse Practitioner

## 2019-07-28 ENCOUNTER — Ambulatory Visit: Payer: BC Managed Care – PPO | Admitting: Nurse Practitioner

## 2019-07-28 ENCOUNTER — Encounter: Payer: Self-pay | Admitting: Nurse Practitioner

## 2019-07-28 VITALS — BP 128/82 | HR 74 | Temp 97.1°F | Ht 66.5 in | Wt 345.4 lb

## 2019-07-28 DIAGNOSIS — E782 Mixed hyperlipidemia: Secondary | ICD-10-CM

## 2019-07-28 DIAGNOSIS — D500 Iron deficiency anemia secondary to blood loss (chronic): Secondary | ICD-10-CM | POA: Diagnosis not present

## 2019-07-28 DIAGNOSIS — E039 Hypothyroidism, unspecified: Secondary | ICD-10-CM | POA: Diagnosis not present

## 2019-07-28 DIAGNOSIS — R350 Frequency of micturition: Secondary | ICD-10-CM

## 2019-07-28 DIAGNOSIS — N898 Other specified noninflammatory disorders of vagina: Secondary | ICD-10-CM | POA: Insufficient documentation

## 2019-07-28 DIAGNOSIS — E1165 Type 2 diabetes mellitus with hyperglycemia: Secondary | ICD-10-CM | POA: Diagnosis not present

## 2019-07-28 DIAGNOSIS — I10 Essential (primary) hypertension: Secondary | ICD-10-CM | POA: Diagnosis not present

## 2019-07-28 DIAGNOSIS — F322 Major depressive disorder, single episode, severe without psychotic features: Secondary | ICD-10-CM

## 2019-07-28 DIAGNOSIS — R7989 Other specified abnormal findings of blood chemistry: Secondary | ICD-10-CM

## 2019-07-28 DIAGNOSIS — F411 Generalized anxiety disorder: Secondary | ICD-10-CM

## 2019-07-28 DIAGNOSIS — N3000 Acute cystitis without hematuria: Secondary | ICD-10-CM

## 2019-07-28 DIAGNOSIS — R609 Edema, unspecified: Secondary | ICD-10-CM

## 2019-07-28 LAB — COMPREHENSIVE METABOLIC PANEL
ALT: 158 U/L — ABNORMAL HIGH (ref 0–35)
AST: 142 U/L — ABNORMAL HIGH (ref 0–37)
Albumin: 4.1 g/dL (ref 3.5–5.2)
Alkaline Phosphatase: 60 U/L (ref 39–117)
BUN: 12 mg/dL (ref 6–23)
CO2: 27 mEq/L (ref 19–32)
Calcium: 9.5 mg/dL (ref 8.4–10.5)
Chloride: 98 mEq/L (ref 96–112)
Creatinine, Ser: 0.7 mg/dL (ref 0.40–1.20)
GFR: 89.42 mL/min (ref >60.00)
Glucose, Bld: 332 mg/dL — ABNORMAL HIGH (ref 70–99)
Potassium: 4.2 mEq/L (ref 3.5–5.1)
Sodium: 134 mEq/L — ABNORMAL LOW (ref 135–145)
Total Bilirubin: 0.3 mg/dL (ref 0.2–1.2)
Total Protein: 7.2 g/dL (ref 6.0–8.3)

## 2019-07-28 LAB — LIPID PANEL
Cholesterol: 204 mg/dL — ABNORMAL HIGH (ref 0–200)
HDL: 30.2 mg/dL — ABNORMAL LOW (ref >39.00)
LDL Cholesterol: 144 mg/dL — ABNORMAL HIGH (ref 0–99)
NonHDL: 173.53
Total CHOL/HDL Ratio: 7
Triglycerides: 146 mg/dL (ref 0.0–149.0)
VLDL: 29.2 mg/dL (ref 0.0–40.0)

## 2019-07-28 LAB — T4, FREE: Free T4: 1.09 ng/dL (ref 0.60–1.60)

## 2019-07-28 LAB — TSH: TSH: 16.82 u[IU]/mL — ABNORMAL HIGH (ref 0.35–4.50)

## 2019-07-28 LAB — CBC WITH DIFFERENTIAL/PLATELET
Basophils Absolute: 0 10*3/uL (ref 0.0–0.1)
Basophils Relative: 0.7 % (ref 0.0–3.0)
Eosinophils Absolute: 0.1 10*3/uL (ref 0.0–0.7)
Eosinophils Relative: 1.5 % (ref 0.0–5.0)
HCT: 36.6 % (ref 36.0–46.0)
Hemoglobin: 11.9 g/dL — ABNORMAL LOW (ref 12.0–15.0)
Lymphocytes Relative: 19.9 % (ref 12.0–46.0)
Lymphs Abs: 1.3 10*3/uL (ref 0.7–4.0)
MCHC: 32.4 g/dL (ref 30.0–36.0)
MCV: 80.6 fl (ref 78.0–100.0)
Monocytes Absolute: 0.4 10*3/uL (ref 0.1–1.0)
Monocytes Relative: 6.3 % (ref 3.0–12.0)
Neutro Abs: 4.8 10*3/uL (ref 1.4–7.7)
Neutrophils Relative %: 71.6 % (ref 43.0–77.0)
Platelets: 231 10*3/uL (ref 150.0–400.0)
RBC: 4.54 Mil/uL (ref 3.87–5.11)
RDW: 16 % — ABNORMAL HIGH (ref 11.5–15.5)
WBC: 6.7 10*3/uL (ref 4.0–10.5)

## 2019-07-28 LAB — MICROALBUMIN / CREATININE URINE RATIO
Creatinine,U: 68 mg/dL
Microalb Creat Ratio: 49.2 mg/g — ABNORMAL HIGH (ref 0.0–30.0)
Microalb, Ur: 33.4 mg/dL — ABNORMAL HIGH (ref 0.0–1.9)

## 2019-07-28 LAB — HEMOGLOBIN A1C: Hgb A1c MFr Bld: 11.5 % — ABNORMAL HIGH (ref 4.6–6.5)

## 2019-07-28 MED ORDER — METFORMIN HCL 500 MG PO TABS: 500.0000 mg | ORAL_TABLET | Freq: Two times a day (BID) | ORAL | 1 refills | Status: DC

## 2019-07-28 MED ORDER — FLUCONAZOLE 150 MG PO TABS: 150.0000 mg | ORAL_TABLET | Freq: Every day | ORAL | 0 refills | Status: DC

## 2019-07-28 MED ORDER — LEVOTHYROXINE SODIUM 175 MCG PO TABS: 175.0000 ug | ORAL_TABLET | Freq: Every day | ORAL | 1 refills | Status: DC

## 2019-07-28 MED ORDER — LEVOTHYROXINE SODIUM 175 MCG PO TABS: 175.0000 ug | ORAL_TABLET | Freq: Every day | ORAL | 0 refills | Status: DC

## 2019-07-28 MED ORDER — FUROSEMIDE 20 MG PO TABS: 20.0000 mg | ORAL_TABLET | Freq: Every day | ORAL | 0 refills | Status: DC | PRN

## 2019-07-28 MED ORDER — TINIDAZOLE 500 MG PO TABS: 2.0000 g | ORAL_TABLET | Freq: Every day | ORAL | 0 refills | Status: AC

## 2019-07-28 MED ORDER — LISINOPRIL 2.5 MG PO TABS: 2.5000 mg | ORAL_TABLET | Freq: Every day | ORAL | 3 refills | Status: DC

## 2019-07-28 MED ORDER — GLIPIZIDE ER 5 MG PO TB24: 5.0000 mg | ORAL_TABLET | Freq: Every day | ORAL | 1 refills | Status: DC

## 2019-07-28 NOTE — Patient Instructions (Addendum)
Thyroid function continues to improve, continue current dose of levothyroxine. Uncontrolled Dm with HgbA1c of 11 and positive urine microalbumin. Resume metformin 500mg  BID and start glipizide 5mg  daily and lisinopril 2.5mg  daily. Very elevated liver enzymes. Do not take diflucan due to its effect on the liver. Triaconazole sent in place. Stop any ETOH or acetaminophen use. Abnormal lipid panel Stable cbc Schedule lab appt in 2weeks (fasting)  Use furosemide as needed for LE edema lasting more than 2days  Maintain DASh diet and daily exercise.

## 2019-07-28 NOTE — Progress Notes (Signed)
Subjective:  Patient ID: Mary Moyer, female    DOB: Oct 14, 1970  Age: 49 y.o. MRN: [id number]  CC: Follow-up (medication refill-pt states she needs all her meds refilled-hasn't took meds in months//pt is fasting//eye exam done 06/09/19 [facility]//no shots today)  Urinary Tract Infection  This is a new problem. The current episode started 1 to 4 weeks ago. The problem occurs every urination. The problem has been gradually worsening. The quality of the pain is described as burning. The pain is mild. There has been no fever. She is sexually active. There is no history of pyelonephritis. Associated symptoms include a discharge, frequency and urgency. Pertinent negatives include no chills, flank pain, hematuria, hesitancy, nausea, possible pregnancy, sweats or vomiting. She has tried home medications and increased fluids for the symptoms. The treatment provided no relief. There is no history of kidney stones, recurrent UTIs or urinary stasis.   Hypothyroidism Thyroid function continues to improve, continue current dose of levothyroxine.  f/up in 51mons  Essential hypertension She discontinue lisinopril and maxzide BP at goal without medications BP Readings from Last 3 Encounters:  07/28/19 128/82  11/04/18 130/72  07/05/18 124/72   Resume lisinopril 2.5mg  for renal protection. Repeat BMP  DM (diabetes mellitus) ([place]) Worsening HgbA1c. Uncontrolled DM with HgbA1c of 11 and positive urine microalbumin. Resume metformin 500mg  BID and start glipizide 5mg  daily. Start lisinopril 2.5mg . F/up in 26months  Elevated LFTs Very elevated liver enzymes. Do not take diflucan due to its effect on the liver. Triaconazole sent in place. Stop any ETOH or acetaminophen use. Abnormal lipid panel Lipid Panel     Component Value Date/Time   CHOL 204 (H) 07/28/2019 0935   TRIG 146.0 07/28/2019 0935   HDL 30.20 (L) 07/28/2019 0935   CHOLHDL 7 07/28/2019 0935   VLDL 29.2 07/28/2019 0935   LDLCALC 144 (H)  07/28/2019 0935   LDLDIRECT 160.0 03/22/2018 1208   Schedule lab appt in 2weeks (fasting): repeat hepatic panel and acute hepatitis panel   Hyperlipidemia Abnormal lipid panel. Hold statin rx at this time due to elevated LFTs Reassess hepatic panel in 2weeks fasting  Depression, major, single episode, severe ([place]) Discontinued effexor and prozac States she does not need medication nor referral to psychology at this time. Depression screen [facility] 2/9 07/28/2019 07/28/2019 07/05/2018  Decreased Interest 1 0 1  Down, Depressed, Hopeless 1 0 1  PHQ - 2 Score 2 0 2  Altered sleeping 2 - 1  Tired, decreased energy 3 - 1  Change in appetite 0 - 1  Feeling bad or failure about yourself  1 - 1  Trouble concentrating 0 - 1  Moving slowly or fidgety/restless 0 - 0  Suicidal thoughts 0 - 0  PHQ-9 Score 8 - 7   GAD 7 : Generalized Anxiety Score 07/28/2019 07/05/2018 06/03/2018 03/22/2018  Nervous, Anxious, on Edge 2 1 3 2   Control/stop worrying 3 1 2 3   Worry too much - different things 2 1 2 3   Trouble relaxing 0 1 1 1   Restless 0 0 0 0  Easily annoyed or irritable 2 1 2  0  Afraid - awful might happen 2 1 1 3   Total GAD 7 Score 11 6 11 12      Iron deficiency anemia due to chronic blood loss Stable cbc and iron panel  Reviewed past Medical, Social and Family history today.  Outpatient Medications Prior to Visit  Medication Sig Dispense Refill   Cholecalciferol (VITAMIN D) 50 MCG (2000 UT) tablet Take  1 tablet (2,000 Units total) by mouth daily. 90 tablet 3   OVER THE COUNTER MEDICATION Plant Force Liquid Iron     levothyroxine (SYNTHROID) 175 MCG tablet Take 1 tablet (175 mcg total) by mouth daily before breakfast. 90 tablet 0   ferrous sulfate 325 (65 FE) MG tablet Take 1 tablet (325 mg total) by mouth 2 (two) times daily with a meal. (Patient not taking: Reported on 11/04/2018) 180 tablet 1   FLUoxetine (PROZAC) 20 MG tablet Take 1 tablet (20 mg total) by mouth daily. (Patient not  taking: Reported on 07/28/2019) 90 tablet 1   lisinopril (ZESTRIL) 10 MG tablet Take 1 tablet (10 mg total) by mouth daily. (Patient not taking: Reported on 07/28/2019) 90 tablet 3   metFORMIN (GLUCOPHAGE) 850 MG tablet Take 1 tablet (850 mg total) by mouth 2 (two) times daily with a meal. (Patient not taking: Reported on 07/28/2019) 180 tablet 1   triamterene-hydrochlorothiazide (MAXZIDE-25) 37.5-25 MG tablet Take 1 tablet by mouth daily. (Patient not taking: Reported on 07/28/2019) 90 tablet 1   venlafaxine (EFFEXOR) 25 MG tablet Take 1 tablet (25 mg total) by mouth 2 (two) times daily with a meal. (Patient not taking: Reported on 07/28/2019) 60 tablet 2   No facility-administered medications prior to visit.    ROS See HPI  Objective:  BP 128/82    Pulse 74    Temp (!) 97.1 F (36.2 C) (Tympanic)    Ht 5' 6.5" (1.689 m)    Wt (!) 345 lb 6.4 oz (156.7 kg)    SpO2 98%    BMI 54.91 kg/m   Physical Exam Constitutional:      Appearance: She is obese.  Cardiovascular:     Rate and Rhythm: Normal rate and regular rhythm.     Pulses: Normal pulses.     Heart sounds: Normal heart sounds.  Pulmonary:     Effort: Pulmonary effort is normal.     Breath sounds: Normal breath sounds.  Abdominal:     General: Bowel sounds are normal.     Palpations: Abdomen is soft.  Genitourinary:    Comments: Declined pelvic exam Neurological:     Mental Status: She is alert and oriented to person, place, and time.  Psychiatric:        Mood and Affect: Mood normal.        Behavior: Behavior normal.        Thought Content: Thought content normal.    Assessment & Plan:  This visit occurred during the SARS-CoV-2 public health emergency.  Safety protocols were in place, including screening questions prior to the visit, additional usage of staff PPE, and extensive cleaning of exam room while observing appropriate contact time as indicated for disinfecting solutions.   Mary Moyer was seen today for  follow-up.  Diagnoses and all orders for this visit:  Essential hypertension -     lisinopril (ZESTRIL) 2.5 MG tablet; Take 1 tablet (2.5 mg total) by mouth daily.  Hypothyroidism, unspecified type -     Comprehensive metabolic panel -     TSH -     T4, free -     Discontinue: levothyroxine (SYNTHROID) 175 MCG tablet; Take 1 tablet (175 mcg total) by mouth daily before breakfast. -     levothyroxine (SYNTHROID) 175 MCG tablet; Take 1 tablet (175 mcg total) by mouth daily before breakfast.  Type 2 diabetes mellitus with hyperglycemia, without long-term current use of insulin (HCC) -     Comprehensive metabolic panel -  Hemoglobin A1c -     Microalbumin / creatinine urine ratio -     glipiZIDE (GLUCOTROL XL) 5 MG 24 hr tablet; Take 1 tablet (5 mg total) by mouth daily with breakfast. With heaviest meal -     metFORMIN (GLUCOPHAGE) 500 MG tablet; Take 1 tablet (500 mg total) by mouth 2 (two) times daily with a meal.  Mixed hyperlipidemia -     Comprehensive metabolic panel -     Lipid panel  Iron deficiency anemia due to chronic blood loss -     CBC with Differential/Platelet -     Iron, TIBC and Ferritin Panel -     ferrous sulfate 325 (65 FE) MG tablet; Take 1 tablet (325 mg total) by mouth daily with breakfast.  Depression, major, single episode, severe (HCC)  Morbid obesity (HCC) -     Vitamin D 1,25 dihydroxy  Vaginal itching -     Urine cytology ancillary only([place]) -     Discontinue: fluconazole (DIFLUCAN) 150 MG tablet; Take 1 tablet (150 mg total) by mouth daily. Take second tab 3days apart from first tab -     tinidazole (TINDAMAX) 500 MG tablet; Take 4 tablets (2,000 mg total) by mouth daily with breakfast for 2 days.  Acute cystitis without hematuria -     Urinalysis w microscopic + reflex cultur -     Urine Culture -     REFLEXIVE URINE CULTURE -     sulfamethoxazole-trimethoprim (BACTRIM) 400-80 MG tablet; Take 1 tablet by mouth 2 (two) times daily.  With food  Dependent edema -     furosemide (LASIX) 20 MG tablet; Take 1 tablet (20 mg total) by mouth daily as needed.  Elevated LFTs -     Hepatic function panel; Future -     Hepatitis panel, acute; Future    Problem List Items Addressed This Visit      Cardiovascular and Mediastinum   Essential hypertension - Primary    She discontinue lisinopril and maxzide BP at goal without medications BP Readings from Last 3 Encounters:  07/28/19 128/82  11/04/18 130/72  07/05/18 124/72   Resume lisinopril 2.5mg  for renal protection. Repeat BMP      Relevant Medications   lisinopril (ZESTRIL) 2.5 MG tablet   furosemide (LASIX) 20 MG tablet     Endocrine   DM (diabetes mellitus) (HCC)    Worsening HgbA1c. Uncontrolled DM with HgbA1c of 11 and positive urine microalbumin. Resume metformin 500mg  BID and start glipizide 5mg  daily. Start lisinopril 2.5mg . F/up in 57months      Relevant Medications   lisinopril (ZESTRIL) 2.5 MG tablet   glipiZIDE (GLUCOTROL XL) 5 MG 24 hr tablet   metFORMIN (GLUCOPHAGE) 500 MG tablet   Other Relevant Orders   Comprehensive metabolic panel (Completed)   Hemoglobin A1c (Completed)   Microalbumin / creatinine urine ratio (Completed)   Hypothyroidism    Thyroid function continues to improve, continue current dose of levothyroxine.  f/up in 31mons      Relevant Medications   levothyroxine (SYNTHROID) 175 MCG tablet   Other Relevant Orders   Comprehensive metabolic panel (Completed)   TSH (Completed)   T4, free (Completed)     Other   Depression, major, single episode, severe ([place])    Discontinued effexor and prozac States she does not need medication nor referral to psychology at this time. Depression screen [facility] 2/9 07/28/2019 07/28/2019 07/05/2018  Decreased Interest 1 0 1  Down, Depressed, Hopeless 1 0 1  PHQ - 2 Score 2 0 2  Altered sleeping 2 - 1  Tired, decreased energy 3 - 1  Change in appetite 0 - 1  Feeling bad or failure about  yourself  1 - 1  Trouble concentrating 0 - 1  Moving slowly or fidgety/restless 0 - 0  Suicidal thoughts 0 - 0  PHQ-9 Score 8 - 7   GAD 7 : Generalized Anxiety Score 07/28/2019 07/05/2018 06/03/2018 03/22/2018  Nervous, Anxious, on Edge 2 1 3 2   Control/stop worrying 3 1 2 3   Worry too much - different things 2 1 2 3   Trouble relaxing 0 1 1 1   Restless 0 0 0 0  Easily annoyed or irritable 2 1 2  0  Afraid - awful might happen 2 1 1 3   Total GAD 7 Score 11 6 11 12          Elevated LFTs    Very elevated liver enzymes. Do not take diflucan due to its effect on the liver. Triaconazole sent in place. Stop any ETOH or acetaminophen use. Abnormal lipid panel Lipid Panel     Component Value Date/Time   CHOL 204 (H) 07/28/2019 0935   TRIG 146.0 07/28/2019 0935   HDL 30.20 (L) 07/28/2019 0935   CHOLHDL 7 07/28/2019 0935   VLDL 29.2 07/28/2019 0935   LDLCALC 144 (H) 07/28/2019 0935   LDLDIRECT 160.0 03/22/2018 1208   Schedule lab appt in 2weeks (fasting): repeat hepatic panel and acute hepatitis panel       Relevant Orders   Hepatic function panel   Hepatitis panel, acute   Hyperlipidemia    Abnormal lipid panel. Hold statin rx at this time due to elevated LFTs Reassess hepatic panel in 2weeks fasting      Relevant Medications   lisinopril (ZESTRIL) 2.5 MG tablet   furosemide (LASIX) 20 MG tablet   Other Relevant Orders   Comprehensive metabolic panel (Completed)   Lipid panel (Completed)   Iron deficiency anemia due to chronic blood loss    Stable cbc and iron panel      Relevant Medications   ferrous sulfate 325 (65 FE) MG tablet   Other Relevant Orders   CBC with Differential/Platelet (Completed)   Iron, TIBC and Ferritin Panel (Completed)   Morbid obesity (HCC)   Relevant Medications   glipiZIDE (GLUCOTROL XL) 5 MG 24 hr tablet   metFORMIN (GLUCOPHAGE) 500 MG tablet   Other Relevant Orders   Vitamin D 1,25 dihydroxy    Other Visit Diagnoses    Vaginal  itching       Relevant Orders   Urine cytology ancillary only([place]) (Completed)   Acute cystitis without hematuria       Relevant Medications   sulfamethoxazole-trimethoprim (BACTRIM) 400-80 MG tablet   Other Relevant Orders   Urinalysis w microscopic + reflex cultur (Completed)   Urine Culture (Completed)   REFLEXIVE URINE CULTURE (Completed)   Dependent edema       Relevant Medications   furosemide (LASIX) 20 MG tablet      I have spent 27mins with this patient regarding history taking, documentation, review and analysis of labs, formulating plan and discussing treatment options with patient.  Follow-up: Return in about 3 months (around 10/28/2019) for DM and HTN, hyperlipidemia (F2F, 23mins).  Wilfred Lacy, NP

## 2019-07-31 ENCOUNTER — Encounter: Payer: Self-pay | Admitting: Nurse Practitioner

## 2019-07-31 DIAGNOSIS — R7989 Other specified abnormal findings of blood chemistry: Secondary | ICD-10-CM | POA: Insufficient documentation

## 2019-07-31 HISTORY — DX: Other specified abnormal findings of blood chemistry: R79.89

## 2019-07-31 LAB — URINE CYTOLOGY ANCILLARY ONLY
Chlamydia: NEGATIVE
Comment: NEGATIVE
Comment: NEGATIVE
Comment: NORMAL
Neisseria Gonorrhea: NEGATIVE
Trichomonas: NEGATIVE

## 2019-07-31 MED ORDER — FERROUS SULFATE 325 (65 FE) MG PO TABS: 325.0000 mg | ORAL_TABLET | Freq: Every day | ORAL | 3 refills | Status: DC

## 2019-07-31 MED ORDER — SULFAMETHOXAZOLE-TRIMETHOPRIM 400-80 MG PO TABS: 1.0000 | ORAL_TABLET | Freq: Two times a day (BID) | ORAL | 0 refills | Status: DC

## 2019-07-31 NOTE — Assessment & Plan Note (Signed)
Very elevated liver enzymes. Do not take diflucan due to its effect on the liver. Triaconazole sent in place. Stop any ETOH or acetaminophen use. Abnormal lipid panel Lipid Panel     Component Value Date/Time   CHOL 204 (H) 07/28/2019 0935   TRIG 146.0 07/28/2019 0935   HDL 30.20 (L) 07/28/2019 0935   CHOLHDL 7 07/28/2019 0935   VLDL 29.2 07/28/2019 0935   LDLCALC 144 (H) 07/28/2019 0935   LDLDIRECT 160.0 03/22/2018 1208   Schedule lab appt in 2weeks (fasting): repeat hepatic panel and acute hepatitis panel

## 2019-07-31 NOTE — Assessment & Plan Note (Signed)
Worsening HgbA1c. Uncontrolled DM with HgbA1c of 11 and positive urine microalbumin. Resume metformin 500mg  BID and start glipizide 5mg  daily. Start lisinopril 2.5mg . F/up in 63months

## 2019-07-31 NOTE — Assessment & Plan Note (Signed)
Abnormal lipid panel. Hold statin rx at this time due to elevated LFTs Reassess hepatic panel in 2weeks fasting

## 2019-07-31 NOTE — Assessment & Plan Note (Signed)
Stable cbc and iron panel

## 2019-07-31 NOTE — Assessment & Plan Note (Signed)
She discontinue lisinopril and maxzide BP at goal without medications BP Readings from Last 3 Encounters:  07/28/19 128/82  11/04/18 130/72  07/05/18 124/72   Resume lisinopril 2.5mg  for renal protection. Repeat BMP

## 2019-07-31 NOTE — Assessment & Plan Note (Signed)
Discontinued effexor and prozac States she does not need medication nor referral to psychology at this time. Depression screen [facility] 2/9 07/28/2019 07/28/2019 07/05/2018  Decreased Interest 1 0 1  Down, Depressed, Hopeless 1 0 1  PHQ - 2 Score 2 0 2  Altered sleeping 2 - 1  Tired, decreased energy 3 - 1  Change in appetite 0 - 1  Feeling bad or failure about yourself  1 - 1  Trouble concentrating 0 - 1  Moving slowly or fidgety/restless 0 - 0  Suicidal thoughts 0 - 0  PHQ-9 Score 8 - 7   GAD 7 : Generalized Anxiety Score 07/28/2019 07/05/2018 06/03/2018 03/22/2018  Nervous, Anxious, on Edge 2 1 3 2   Control/stop worrying 3 1 2 3   Worry too much - different things 2 1 2 3   Trouble relaxing 0 1 1 1   Restless 0 0 0 0  Easily annoyed or irritable 2 1 2  0  Afraid - awful might happen 2 1 1 3   Total GAD 7 Score 11 6 11  12

## 2019-07-31 NOTE — Assessment & Plan Note (Signed)
Thyroid function continues to improve, continue current dose of levothyroxine.  f/up in 64mons

## 2019-08-02 NOTE — Telephone Encounter (Signed)
Patient is returning the call. CB is [phone]

## 2019-08-03 ENCOUNTER — Encounter: Payer: Self-pay | Admitting: Nurse Practitioner

## 2019-08-03 LAB — URINALYSIS W MICROSCOPIC + REFLEX CULTURE
Bacteria, UA: NONE SEEN /HPF
Bilirubin Urine: NEGATIVE
Hgb urine dipstick: NEGATIVE
Hyaline Cast: NONE SEEN /LPF
Leukocyte Esterase: NEGATIVE
Nitrites, Initial: NEGATIVE
RBC / HPF: NONE SEEN /HPF (ref 0–2)
Specific Gravity, Urine: 1.033 (ref 1.001–1.03)
Squamous Epithelial / HPF: NONE SEEN /HPF (ref <=5)
pH: <=5 (ref 5.0–8.0)

## 2019-08-03 LAB — VITAMIN D 1,25 DIHYDROXY
Vitamin D 1, 25 (OH)2 Total: 43 pg/mL (ref 18–72)
Vitamin D2 1, 25 (OH)2: <8 pg/mL
Vitamin D3 1, 25 (OH)2: 43 pg/mL

## 2019-08-03 LAB — IRON,TIBC AND FERRITIN PANEL
Ferritin: 32 ng/mL (ref 16–232)
Iron: 55 ug/dL (ref 40–190)

## 2019-08-03 LAB — URINE CULTURE
MICRO NUMBER:: 10744596
SPECIMEN QUALITY:: ADEQUATE

## 2019-08-03 LAB — CULTURE INDICATED

## 2019-08-03 LAB — TRANSFERRIN: Transferrin: 371 mg/dL — ABNORMAL HIGH (ref 188–341)

## 2019-08-14 ENCOUNTER — Encounter: Payer: Self-pay | Admitting: Nurse Practitioner

## 2019-08-14 NOTE — Progress Notes (Signed)
Abstracted result and sent to scan  

## 2019-08-26 DIAGNOSIS — Z20822 Contact with and (suspected) exposure to covid-19: Secondary | ICD-10-CM | POA: Diagnosis not present

## 2019-08-26 DIAGNOSIS — Z03818 Encounter for observation for suspected exposure to other biological agents ruled out: Secondary | ICD-10-CM | POA: Diagnosis not present

## 2019-10-30 ENCOUNTER — Ambulatory Visit: Payer: BC Managed Care – PPO | Admitting: Nurse Practitioner

## 2019-11-06 ENCOUNTER — Encounter: Payer: Self-pay | Admitting: Nurse Practitioner

## 2019-11-06 ENCOUNTER — Ambulatory Visit: Payer: BC Managed Care – PPO | Admitting: Nurse Practitioner

## 2019-11-06 ENCOUNTER — Other Ambulatory Visit: Payer: Self-pay

## 2019-11-06 VITALS — BP 118/78 | HR 82 | Temp 97.3°F | Ht 66.5 in | Wt 339.2 lb

## 2019-11-06 DIAGNOSIS — F322 Major depressive disorder, single episode, severe without psychotic features: Secondary | ICD-10-CM

## 2019-11-06 DIAGNOSIS — I10 Essential (primary) hypertension: Secondary | ICD-10-CM

## 2019-11-06 DIAGNOSIS — E039 Hypothyroidism, unspecified: Secondary | ICD-10-CM

## 2019-11-06 DIAGNOSIS — E1165 Type 2 diabetes mellitus with hyperglycemia: Secondary | ICD-10-CM | POA: Diagnosis not present

## 2019-11-06 DIAGNOSIS — E782 Mixed hyperlipidemia: Secondary | ICD-10-CM | POA: Diagnosis not present

## 2019-11-06 DIAGNOSIS — R7989 Other specified abnormal findings of blood chemistry: Secondary | ICD-10-CM

## 2019-11-06 LAB — HEPATIC FUNCTION PANEL
ALT: 150 U/L — ABNORMAL HIGH (ref 0–35)
AST: 116 U/L — ABNORMAL HIGH (ref 0–37)
Albumin: 4.3 g/dL (ref 3.5–5.2)
Alkaline Phosphatase: 64 U/L (ref 39–117)
Bilirubin, Direct: 0.1 mg/dL (ref 0.0–0.3)
Total Bilirubin: 0.3 mg/dL (ref 0.2–1.2)
Total Protein: 7.7 g/dL (ref 6.0–8.3)

## 2019-11-06 LAB — BASIC METABOLIC PANEL
BUN: 10 mg/dL (ref 6–23)
CO2: 25 mEq/L (ref 19–32)
Calcium: 9.2 mg/dL (ref 8.4–10.5)
Chloride: 99 mEq/L (ref 96–112)
Creatinine, Ser: 0.65 mg/dL (ref 0.40–1.20)
GFR: 104.43 mL/min (ref >60.00)
Glucose, Bld: 306 mg/dL — ABNORMAL HIGH (ref 70–99)
Potassium: 3.8 mEq/L (ref 3.5–5.1)
Sodium: 133 mEq/L — ABNORMAL LOW (ref 135–145)

## 2019-11-06 LAB — HEMOGLOBIN A1C: Hgb A1c MFr Bld: 11.5 % — ABNORMAL HIGH (ref 4.6–6.5)

## 2019-11-06 LAB — T4, FREE: Free T4: 0.58 ng/dL — ABNORMAL LOW (ref 0.60–1.60)

## 2019-11-06 LAB — TSH: TSH: 30.67 u[IU]/mL — ABNORMAL HIGH (ref 0.35–4.50)

## 2019-11-06 MED ORDER — BLOOD GLUCOSE MONITOR KIT: PACK | 0 refills | Status: AC

## 2019-11-06 NOTE — Assessment & Plan Note (Addendum)
Uncontrolled with hgbA1c of 11 Current use of metformin and glipizide, adverse side effects Declined use of insulin or saxenda or any injection medication Maintain low carb diet, has not implemented any regular exercise. BP at goal Glucometer meter rx provided today.  HgbA1c has not improved. Increase metformin to 850mg  BID and glipizide to 10mg . It is important you schedule an appt with weight loss clinic.

## 2019-11-06 NOTE — Progress Notes (Signed)
Subjective:  Patient ID: Mary Moyer, female    DOB: 04-10-1970  Age: 49 y.o. MRN: [id number]  CC: Follow-up (3 month f/u on DM, HTN, and cholesterol. Pt is not fasting. Pt declines flu vaccine. )   HPI  DM (diabetes mellitus) ([place]) Uncontrolled with hgbA1c of 11 Current use of metformin and glipizide, adverse side effects Declined use of insulin or saxenda or any injection medication Maintain low carb diet, has not implemented any regular exercise. BP at goal Glucometer meter rx provided today.  HgbA1c has not improved. Increase metformin to 864m BID and glipizide to 128m It is important you schedule an appt with weight loss clinic.   Depression, major, single episode, severe [place], she contemplates about death but has no intention to harm herself. She agreed to call 911 if SI worsen. She plans to schedule an appt with a therapist and psychiatrist [facility] 11/06/2019 07/28/2019 07/28/2019  Decreased Interest 2 1 0  Down, Depressed, Hopeless 3 1 0  PHQ - 2 Score 5 2 0  Altered sleeping 1 2 -  Tired, decreased energy 1 3 -  Change in appetite 0 0 -  Feeling bad or failure about yourself  3 1 -  Trouble concentrating 1 0 -  Moving slowly or fidgety/restless 0 0 -  Suicidal thoughts 1 0 -  PHQ-9 Score 12 8 -  Difficult doing work/chores Somewhat difficult - -   GAD 7 : Generalized Anxiety Score 11/06/2019 07/28/2019 07/05/2018 06/03/2018  Nervous, Anxious, on Edge '1 2 1 3  ' Control/stop worrying '1 3 1 2  ' Worry too much - different things '1 2 1 2  ' Trouble relaxing 1 0 1 1  Restless 0 0 0 0  Easily annoyed or irritable '1 2 1 2  ' Afraid - awful might happen '1 2 1 1  ' Total GAD 7 Score '6 11 6 11  ' Anxiety Difficulty Somewhat difficult - - -     Hypothyroidism Persistent elevated TSH and low T4. Increase levothyroxine to 20036m I also entered referral to endocrinologist due to difficulty in improving thyroid function.   Elevated  LFTs Improving liver enzymes but still elevated. Ordered ABD US [place] hepatitis panel. Schedule lab appt   Wt Readings from Last 3 Encounters:  11/06/19 (!) 339 lb 3.2 oz (153.9 kg)  07/28/19 (!) 345 lb 6.4 oz (156.7 kg)  11/04/18 (!) 351 lb 6.4 oz (159.4 kg)   BP Readings from Last 3 Encounters:  11/06/19 118/78  07/28/19 128/82  11/04/18 130/72    Reviewed past Medical, Social and Family history today.  Outpatient Medications Prior to Visit  Medication Sig Dispense Refill   furosemide (LASIX) 20 MG tablet Take 1 tablet (20 mg total) by mouth daily as needed. 7 tablet 0   lisinopril (ZESTRIL) 2.5 MG tablet Take 1 tablet (2.5 mg total) by mouth daily. 90 tablet 3   OVER THE COUNTER MEDICATION Plant Force Liquid Iron     glipiZIDE (GLUCOTROL XL) 5 MG 24 hr tablet Take 1 tablet (5 mg total) by mouth daily with breakfast. With heaviest meal 90 tablet 1   levothyroxine (SYNTHROID) 175 MCG tablet Take 1 tablet (175 mcg total) by mouth daily before breakfast. 90 tablet 1   metFORMIN (GLUCOPHAGE) 500 MG tablet Take 1 tablet (500 mg total) by mouth 2 (two) times daily with a meal. 180 tablet 1   Cholecalciferol (VITAMIN D) 50 MCG (2000 UT) tablet Take 1 tablet (2,000 Units total) by  mouth daily. (Patient not taking: Reported on 11/06/2019) 90 tablet 3   ferrous sulfate 325 (65 FE) MG tablet Take 1 tablet (325 mg total) by mouth daily with breakfast. (Patient not taking: Reported on 11/06/2019) 90 tablet 3   sulfamethoxazole-trimethoprim (BACTRIM) 400-80 MG tablet Take 1 tablet by mouth 2 (two) times daily. With food (Patient not taking: Reported on 11/06/2019) 14 tablet 0   No facility-administered medications prior to visit.    ROS See HPI  Objective:  BP 118/78 (BP Location: Left Arm, Patient Position: Sitting, Cuff Size: Large)    Pulse 82    Temp (!) 97.3 F (36.3 C) (Temporal)    Ht 5' 6.5" (1.689 m)    Wt (!) 339 lb 3.2 oz (153.9 kg)    SpO2 97%    BMI 53.93 kg/m   Physical  Exam Vitals reviewed.  Constitutional:      Appearance: She is obese.  Cardiovascular:     Rate and Rhythm: Normal rate and regular rhythm.     Pulses: Normal pulses.     Heart sounds: Normal heart sounds.  Pulmonary:     Effort: Pulmonary effort is normal.     Breath sounds: Normal breath sounds.  Musculoskeletal:     Cervical back: Normal range of motion and neck supple.     Right lower leg: No edema.     Left lower leg: No edema.  Neurological:     Mental Status: She is alert and oriented to person, place, and time.  Psychiatric:        Behavior: Behavior normal.        Thought Content: Thought content normal.    Assessment & Plan:  This visit occurred during the SARS-CoV-2 public health emergency.  Safety protocols were in place, including screening questions prior to the visit, additional usage of staff PPE, and extensive cleaning of exam room while observing appropriate contact time as indicated for disinfecting solutions.   Mary Moyer was seen today for follow-up.  Diagnoses and all orders for this visit:  Essential hypertension -     Basic metabolic panel  Type 2 diabetes mellitus with hyperglycemia, without long-term current use of insulin (HCC) -     Hemoglobin A1c -     Basic metabolic panel -     blood glucose meter kit and supplies KIT; Dispense based on patient and insurance preference. Use once daily before breakfast. E11.65 -     glipiZIDE (GLUCOTROL XL) 10 MG 24 hr tablet; Take 1 tablet (10 mg total) by mouth daily. With heaviest meal -     metFORMIN (GLUCOPHAGE) 850 MG tablet; Take 1 tablet (850 mg total) by mouth 2 (two) times daily with a meal. -     Ambulatory referral to Endocrinology  Hypothyroidism, unspecified type -     TSH -     T4, free -     levothyroxine (SYNTHROID) 200 MCG tablet; Take 1 tablet (200 mcg total) by mouth daily before breakfast. -     Ambulatory referral to Endocrinology  Mixed hyperlipidemia -     Hepatic function  panel  Elevated LFTs -     Hepatic function panel -     Hepatitis C Antibody -     Hepatitis, Acute -     US Abdomen Limited RUQ (LIVER/GB); Future  Depression, major, single episode, severe ([place])    Problem List Items Addressed This Visit      Cardiovascular and Mediastinum   Essential hypertension - Primary  Relevant Orders   Basic metabolic panel (Completed)     Endocrine   DM (diabetes mellitus) ([place])    Uncontrolled with hgbA1c of 11 Current use of metformin and glipizide, adverse side effects Declined use of insulin or saxenda or any injection medication Maintain low carb diet, has not implemented any regular exercise. BP at goal Glucometer meter rx provided today.  HgbA1c has not improved. Increase metformin to 851m BID and glipizide to 180m It is important you schedule an appt with weight loss clinic.       Relevant Medications   blood glucose meter kit and supplies KIT   glipiZIDE (GLUCOTROL XL) 10 MG 24 hr tablet   metFORMIN (GLUCOPHAGE) 850 MG tablet   Other Relevant Orders   Hemoglobin A1c (Completed)   Basic metabolic panel (Completed)   Ambulatory referral to Endocrinology   Hypothyroidism    Persistent elevated TSH and low T4. Increase levothyroxine to 20048m I also entered referral to endocrinologist due to difficulty in improving thyroid function.       Relevant Medications   levothyroxine (SYNTHROID) 200 MCG tablet   Other Relevant Orders   TSH (Completed)   T4, free (Completed)   Ambulatory referral to Endocrinology     Other   Depression, major, single episode, severe ([place]  No suicide plan, she contemplates about death but has no intention to harm herself. She agreed to call 911 if SI worsen. She plans to schedule an appt with a therapist and psychiatrist [facility] 11/06/2019 07/28/2019 07/28/2019  Decreased Interest 2 1 0  Down, Depressed, Hopeless 3 1 0  PHQ - 2 Score 5 2 0  Altered sleeping 1 2 -  Tired,  decreased energy 1 3 -  Change in appetite 0 0 -  Feeling bad or failure about yourself  3 1 -  Trouble concentrating 1 0 -  Moving slowly or fidgety/restless 0 0 -  Suicidal thoughts 1 0 -  PHQ-9 Score 12 8 -  Difficult doing work/chores Somewhat difficult - -   GAD 7 : Generalized Anxiety Score 11/06/2019 07/28/2019 07/05/2018 06/03/2018  Nervous, Anxious, on Edge '1 2 1 3  ' Control/stop worrying '1 3 1 2  ' Worry too much - different things '1 2 1 2  ' Trouble relaxing 1 0 1 1  Restless 0 0 0 0  Easily annoyed or irritable '1 2 1 2  ' Afraid - awful might happen '1 2 1 1  ' Total GAD 7 Score '6 11 6 11  ' Anxiety Difficulty Somewhat difficult - - -         Elevated LFTs    Improving liver enzymes but still elevated. Ordered ABD US [place] hepatitis panel. Schedule lab appt       Relevant Orders   Hepatic function panel (Completed)   Hepatitis C Antibody   Hepatitis, Acute   US [place] Limited RUQ (LIVER/GB)   Hyperlipidemia   Relevant Orders   Hepatic function panel (Completed)      Follow-up: Return in about 3 months (around 02/06/2020) for DM and , hyperlipidemia (fasting).  ChaWilfred LacyP

## 2019-11-06 NOTE — Assessment & Plan Note (Signed)
No suicide plan, she contemplates about death but has no intention to harm herself. She agreed to call 911 if SI worsen. She plans to schedule an appt with a therapist and psychiatrist Depression screen [facility] 2/9 11/06/2019 07/28/2019 07/28/2019  Decreased Interest 2 1 0  Down, Depressed, Hopeless 3 1 0  PHQ - 2 Score 5 2 0  Altered sleeping 1 2 -  Tired, decreased energy 1 3 -  Change in appetite 0 0 -  Feeling bad or failure about yourself  3 1 -  Trouble concentrating 1 0 -  Moving slowly or fidgety/restless 0 0 -  Suicidal thoughts 1 0 -  PHQ-9 Score 12 8 -  Difficult doing work/chores Somewhat difficult - -   GAD 7 : Generalized Anxiety Score 11/06/2019 07/28/2019 07/05/2018 06/03/2018  Nervous, Anxious, on Edge 1 2 1 3   Control/stop worrying 1 3 1 2   Worry too much - different things 1 2 1 2   Trouble relaxing 1 0 1 1  Restless 0 0 0 0  Easily annoyed or irritable 1 2 1 2   Afraid - awful might happen 1 2 1 1   Total GAD 7 Score 6 11 6 11   Anxiety Difficulty Somewhat difficult - - -

## 2019-11-06 NOTE — Patient Instructions (Signed)
Go to lab for blood draw Schedule appt with therapist.

## 2019-11-07 MED ORDER — METFORMIN HCL 850 MG PO TABS: 850.0000 mg | ORAL_TABLET | Freq: Two times a day (BID) | ORAL | 3 refills | Status: DC

## 2019-11-07 MED ORDER — GLIPIZIDE ER 10 MG PO TB24: 10.0000 mg | ORAL_TABLET | Freq: Every day | ORAL | 1 refills | Status: DC

## 2019-11-07 MED ORDER — LEVOTHYROXINE SODIUM 200 MCG PO TABS: 200.0000 ug | ORAL_TABLET | Freq: Every day | ORAL | 1 refills | Status: DC

## 2019-11-07 NOTE — Assessment & Plan Note (Signed)
Persistent elevated TSH and low T4. Increase levothyroxine to 245mcg. I also entered referral to endocrinologist due to difficulty in improving thyroid function.

## 2019-11-07 NOTE — Assessment & Plan Note (Signed)
Improving liver enzymes but still elevated. Ordered ABD [place] and hepatitis panel. Schedule lab appt

## 2019-11-15 ENCOUNTER — Other Ambulatory Visit: Payer: Self-pay

## 2019-11-15 ENCOUNTER — Other Ambulatory Visit (INDEPENDENT_AMBULATORY_CARE_PROVIDER_SITE_OTHER): Payer: BC Managed Care – PPO

## 2019-11-15 DIAGNOSIS — R7989 Other specified abnormal findings of blood chemistry: Secondary | ICD-10-CM

## 2019-11-15 LAB — HEPATIC FUNCTION PANEL
ALT: 159 U/L — ABNORMAL HIGH (ref 0–35)
AST: 115 U/L — ABNORMAL HIGH (ref 0–37)
Albumin: 4.3 g/dL (ref 3.5–5.2)
Alkaline Phosphatase: 65 U/L (ref 39–117)
Bilirubin, Direct: 0.1 mg/dL (ref 0.0–0.3)
Total Bilirubin: 0.4 mg/dL (ref 0.2–1.2)
Total Protein: 7.1 g/dL (ref 6.0–8.3)

## 2019-11-16 LAB — HEPATITIS PANEL, ACUTE
Hep A IgM: NONREACTIVE
Hep B C IgM: NONREACTIVE
Hepatitis B Surface Ag: NONREACTIVE
Hepatitis C Ab: NONREACTIVE
SIGNAL TO CUT-OFF: 0.14 (ref <1.00)

## 2019-12-06 DIAGNOSIS — Z20822 Contact with and (suspected) exposure to covid-19: Secondary | ICD-10-CM | POA: Diagnosis not present

## 2019-12-06 DIAGNOSIS — U071 COVID-19: Secondary | ICD-10-CM | POA: Diagnosis not present

## 2019-12-11 ENCOUNTER — Encounter: Payer: Self-pay | Admitting: Internal Medicine

## 2019-12-14 ENCOUNTER — Encounter: Payer: Self-pay | Admitting: Nurse Practitioner

## 2019-12-14 ENCOUNTER — Telehealth (INDEPENDENT_AMBULATORY_CARE_PROVIDER_SITE_OTHER): Payer: BC Managed Care – PPO | Admitting: Nurse Practitioner

## 2019-12-14 VITALS — HR 86 | Temp 98.2°F | Ht 66.5 in

## 2019-12-14 DIAGNOSIS — R0902 Hypoxemia: Secondary | ICD-10-CM

## 2019-12-14 DIAGNOSIS — R0600 Dyspnea, unspecified: Secondary | ICD-10-CM | POA: Diagnosis not present

## 2019-12-14 DIAGNOSIS — U071 COVID-19: Secondary | ICD-10-CM | POA: Diagnosis not present

## 2019-12-14 DIAGNOSIS — R0609 Other forms of dyspnea: Secondary | ICD-10-CM

## 2019-12-14 MED ORDER — PROAIR RESPICLICK 108 (90 BASE) MCG/ACT IN AEPB: 1.0000 | INHALATION_SPRAY | Freq: Four times a day (QID) | RESPIRATORY_TRACT | 0 refills | Status: DC | PRN

## 2019-12-14 MED ORDER — BENZONATATE 100 MG PO CAPS: 100.0000 mg | ORAL_CAPSULE | Freq: Three times a day (TID) | ORAL | 0 refills | Status: DC | PRN

## 2019-12-14 NOTE — Progress Notes (Signed)
Virtual Visit via Video Note  I connected with@ on 12/14/19 at  9:30 AM EST by a video enabled telemedicine application and verified that I am speaking with the correct person using two identifiers.  Location: Patient:Home Provider: Office Participants: patient and provider  I discussed the limitations of evaluation and management by telemedicine and the availability of in person appointments. I also discussed with the patient that there may be a patient responsible charge related to this service. The patient expressed understanding and agreed to proceed.  CC:Pt tested positive for COVID on 12/06/2019, Pt states she was having issues breathing on Monday and would like to discuss this with you. Breathing has improved but she is still has a cough that causes her to lose her breath at times.  History of Present Illness: URI  This is a new problem. The current episode started in the past 7 days. The problem has been gradually worsening. There has been no fever. Associated symptoms include chest pain, congestion, coughing, headaches, joint pain, rhinorrhea, sinus pain, sneezing and wheezing. She has tried acetaminophen, decongestant, increased fluids and NSAIDs for the symptoms. The treatment provided mild relief.  COVID exposure on 12/02/2019, symptoms onset 12/06/2019, positive results 12/06/2019.  Fasting glucose:250 Non-fasting glucose: 330 States she is not compliant with diet and medications  Vitals:   12/14/19 0918 12/14/19 1002  Pulse: (!) 109 86  Temp: 98.2 F (36.8 C)   SpO2: 91% 95%   Observations/Objective: Physical Exam Constitutional:      General: She is not in acute distress. Pulmonary:     Effort: Pulmonary effort is normal.  Neurological:     Mental Status: She is alert and oriented to person, place, and time.    Assessment and Plan: Eldean was seen today for acute visit.  Diagnoses and all orders for this visit:  COVID-19 -     Albuterol Sulfate (PROAIR  RESPICLICK) 408 (90 Base) MCG/ACT AEPB; Inhale 1-2 puffs into the lungs every 6 (six) hours as needed. -     benzonatate (TESSALON) 100 MG capsule; Take 1-2 capsules (100-200 mg total) by mouth 3 (three) times daily as needed.  Dyspnea on exertion -     Albuterol Sulfate (PROAIR RESPICLICK) 144 (90 Base) MCG/ACT AEPB; Inhale 1-2 puffs into the lungs every 6 (six) hours as needed. -     benzonatate (TESSALON) 100 MG capsule; Take 1-2 capsules (100-200 mg total) by mouth 3 (three) times daily as needed.  Hypoxia -     Albuterol Sulfate (PROAIR RESPICLICK) 818 (90 Base) MCG/ACT AEPB; Inhale 1-2 puffs into the lungs every 6 (six) hours as needed. -     benzonatate (TESSALON) 100 MG capsule; Take 1-2 capsules (100-200 mg total) by mouth 3 (three) times daily as needed.   Follow Up Instructions: Unable to prescribed medrol or oral prednisone due to uncontrolled DM with hyperglycemia. Advised Ms. Gashi to go to ED for additional eval due to hypoxia and SOB. Informed her about her risk for PE and the importance of CT Chest. She verbalized understanding and declined to go.   I discussed the assessment and treatment plan with the patient. The patient was provided an opportunity to ask questions and all were answered. The patient agreed with the plan and demonstrated an understanding of the instructions.   The patient was advised to call back or seek an in-person evaluation if the symptoms worsen or if the condition fails to improve as anticipated.  Wilfred Lacy, NP

## 2019-12-14 NOTE — Patient Instructions (Signed)
10 Things You Can Do to Manage Your COVID-19 Symptoms at Home If you have possible or confirmed COVID-19: 1. Stay home from work and school. And stay away from other public places. If you must go out, avoid using any kind of public transportation, ridesharing, or taxis. 2. Monitor your symptoms carefully. If your symptoms get worse, call your healthcare provider immediately. 3. Get rest and stay hydrated. 4. If you have a medical appointment, call the healthcare provider ahead of time and tell them that you have or may have COVID-19. 5. For medical emergencies, call 911 and notify the dispatch personnel that you have or may have COVID-19. 6. Cover your cough and sneezes with a tissue or use the inside of your elbow. 7. Wash your hands often with soap and water for at least 20 seconds or clean your hands with an alcohol-based hand sanitizer that contains at least 60% alcohol. 8. As much as possible, stay in a specific room and away from other people in your home. Also, you should use a separate bathroom, if available. If you need to be around other people in or outside of the home, wear a mask. 9. Avoid sharing personal items with other people in your household, like dishes, towels, and bedding. 10. Clean all surfaces that are touched often, like counters, tabletops, and doorknobs. Use household cleaning sprays or wipes according to the label instructions. cdc.gov/coronavirus 07/06/2018 This information is not intended to replace advice given to you by your health care provider. Make sure you discuss any questions you have with your health care provider. Document Revised: 12/08/2018 Document Reviewed: 12/08/2018 Elsevier Patient Education  2020 Elsevier Inc.  

## 2020-02-07 ENCOUNTER — Ambulatory Visit: Payer: BC Managed Care – PPO | Admitting: Nurse Practitioner

## 2020-02-23 ENCOUNTER — Ambulatory Visit: Payer: BC Managed Care – PPO | Admitting: Nurse Practitioner

## 2020-03-08 ENCOUNTER — Ambulatory Visit: Payer: BC Managed Care – PPO | Admitting: Nurse Practitioner

## 2020-03-19 ENCOUNTER — Ambulatory Visit: Payer: BC Managed Care – PPO | Admitting: Nurse Practitioner

## 2020-03-19 ENCOUNTER — Other Ambulatory Visit: Payer: Self-pay

## 2020-03-19 ENCOUNTER — Encounter: Payer: Self-pay | Admitting: Nurse Practitioner

## 2020-03-19 VITALS — BP 160/110 | HR 74 | Temp 97.1°F | Ht 67.0 in | Wt 321.8 lb

## 2020-03-19 DIAGNOSIS — E039 Hypothyroidism, unspecified: Secondary | ICD-10-CM | POA: Diagnosis not present

## 2020-03-19 DIAGNOSIS — R7989 Other specified abnormal findings of blood chemistry: Secondary | ICD-10-CM

## 2020-03-19 DIAGNOSIS — I10 Essential (primary) hypertension: Secondary | ICD-10-CM | POA: Diagnosis not present

## 2020-03-19 DIAGNOSIS — E1165 Type 2 diabetes mellitus with hyperglycemia: Secondary | ICD-10-CM

## 2020-03-19 DIAGNOSIS — D500 Iron deficiency anemia secondary to blood loss (chronic): Secondary | ICD-10-CM | POA: Diagnosis not present

## 2020-03-19 DIAGNOSIS — E782 Mixed hyperlipidemia: Secondary | ICD-10-CM | POA: Diagnosis not present

## 2020-03-19 LAB — BASIC METABOLIC PANEL
BUN: 11 mg/dL (ref 6–23)
CO2: 28 mEq/L (ref 19–32)
Calcium: 9.5 mg/dL (ref 8.4–10.5)
Chloride: 98 mEq/L (ref 96–112)
Creatinine, Ser: 0.73 mg/dL (ref 0.40–1.20)
GFR: 97.3 mL/min (ref >60.00)
Glucose, Bld: 257 mg/dL — ABNORMAL HIGH (ref 70–99)
Potassium: 4.1 mEq/L (ref 3.5–5.1)
Sodium: 135 mEq/L (ref 135–145)

## 2020-03-19 LAB — HEPATIC FUNCTION PANEL
ALT: 185 U/L — ABNORMAL HIGH (ref 0–35)
AST: 156 U/L — ABNORMAL HIGH (ref 0–37)
Albumin: 4.2 g/dL (ref 3.5–5.2)
Alkaline Phosphatase: 67 U/L (ref 39–117)
Bilirubin, Direct: 0.1 mg/dL (ref 0.0–0.3)
Total Bilirubin: 0.4 mg/dL (ref 0.2–1.2)
Total Protein: 7.4 g/dL (ref 6.0–8.3)

## 2020-03-19 LAB — LIPID PANEL
Cholesterol: 233 mg/dL — ABNORMAL HIGH (ref 0–200)
HDL: 34.3 mg/dL — ABNORMAL LOW (ref >39.00)
LDL Cholesterol: 166 mg/dL — ABNORMAL HIGH (ref 0–99)
NonHDL: 199.17
Total CHOL/HDL Ratio: 7
Triglycerides: 167 mg/dL — ABNORMAL HIGH (ref 0.0–149.0)
VLDL: 33.4 mg/dL (ref 0.0–40.0)

## 2020-03-19 LAB — HEMOGLOBIN A1C: Hgb A1c MFr Bld: 10.7 % — ABNORMAL HIGH (ref 4.6–6.5)

## 2020-03-19 LAB — T4, FREE: Free T4: 0.5 ng/dL — ABNORMAL LOW (ref 0.60–1.60)

## 2020-03-19 LAB — FERRITIN: Ferritin: 51.2 ng/mL (ref 10.0–291.0)

## 2020-03-19 LAB — TSH: TSH: 48.03 u[IU]/mL — ABNORMAL HIGH (ref 0.35–4.50)

## 2020-03-19 MED ORDER — ATORVASTATIN CALCIUM 20 MG PO TABS: 20.0000 mg | ORAL_TABLET | ORAL | 1 refills | Status: DC

## 2020-03-19 MED ORDER — METFORMIN HCL ER 750 MG PO TB24: 750.0000 mg | ORAL_TABLET | Freq: Every day | ORAL | 3 refills | Status: DC

## 2020-03-19 MED ORDER — LISINOPRIL 10 MG PO TABS
10.0000 mg | ORAL_TABLET | Freq: Every day | ORAL | 3 refills | Status: DC
Start: 2020-03-19 — End: 2021-03-24

## 2020-03-19 MED ORDER — GLIPIZIDE ER 10 MG PO TB24
10.0000 mg | ORAL_TABLET | Freq: Every day | ORAL | 3 refills | Status: DC
Start: 2020-03-19 — End: 2020-12-16

## 2020-03-19 NOTE — Progress Notes (Signed)
Subjective:  Patient ID: Mary Moyer, female    DOB: 06-06-1970  Age: 50 y.o. MRN: [id number]  CC: Follow-up (3 month f/u on DM and hyperlipidemia, pt is fasting. Pt states she ran out of HTN medication in Jan. 2022 and has not took it since. Declines flu vaccine.)  HPI Essential hypertension Non compliant with medication Reports she has made dietary changes: low sodium and low sugar. BP Readings from Last 3 Encounters:  03/19/20 (!) 160/110  11/06/19 118/78  07/28/19 128/82   Resume lisinopril at 85m  repeat BMP Advised about the importance of medication compliance and possible complication from uncontrolled HTN   DM (diabetes mellitus) (HCC) Intermittent diarrhea with metformin 8537mBID, so she takes med only once a day Noncompliant with glipizide. Did not schedule appt with endocrinology.  Advised Mary Moyer about possible complications of uncontrolled DM and importance of medication compliance.   Slightly improvement in HgbA1c: 11.5 to 10.7%. this is still not at goal so resume glipizide. I changed metformin to 75049mR daily to improve GI side effects. F/up in 60mo14monthvised to schedule appt with endocrinology  Hypothyroidism Repeat TSH and T4: no change Advised to schedule appt with endocrinology ASAP  Hyperlipidemia Repeat lipid panel and hepatic panel: no improvement without statin Resume atorvastatin at 20mg36mweek  repeat LFTs in 60mont28monthvated LFTs No improvement in AST and ALT No nausea or ABD pain, no jaundice Advised to schedule appt for ABD [place] referral to GI  BP Readings from Last 3 Encounters:  03/19/20 (!) 160/110  11/06/19 118/78  07/28/19 128/82    Wt Readings from Last 3 Encounters:  03/19/20 (!) 321 lb 12.8 oz (146 kg)  11/06/19 (!) 339 lb 3.2 oz (153.9 kg)  07/28/19 (!) 345 lb 6.4 oz (156.7 kg)   Reviewed past Medical, Social and Family history today.  Outpatient Medications Prior to Visit  Medication Sig Dispense  Refill  . blood glucose meter kit and supplies KIT Dispense based on patient and insurance preference. Use once daily before breakfast. E11.65 1 each 0  . levothyroxine (SYNTHROID) 200 MCG tablet Take 1 tablet (200 mcg total) by mouth daily before breakfast. 90 tablet 1  . glipiZIDE (GLUCOTROL XL) 10 MG 24 hr tablet Take 1 tablet (10 mg total) by mouth daily. With heaviest meal 90 tablet 1  . lisinopril (ZESTRIL) 2.5 MG tablet Take 1 tablet (2.5 mg total) by mouth daily. 90 tablet 3  . metFORMIN (GLUCOPHAGE) 850 MG tablet Take 1 tablet (850 mg total) by mouth 2 (two) times daily with a meal. 90 tablet 3  . Cholecalciferol (VITAMIN D) 50 MCG (2000 UT) tablet Take 1 tablet (2,000 Units total) by mouth daily. (Patient not taking: No sig reported) 90 tablet 3  . Albuterol Sulfate (PROAIR RESPICLICK) 108 (9427ase) MCG/ACT AEPB Inhale 1-2 puffs into the lungs every 6 (six) hours as needed. (Patient not taking: Reported on 03/19/2020) 1 each 0  . benzonatate (TESSALON) 100 MG capsule Take 1-2 capsules (100-200 mg total) by mouth 3 (three) times daily as needed. (Patient not taking: Reported on 03/19/2020) 30 capsule 0  . ferrous sulfate 325 (65 FE) MG tablet Take 1 tablet (325 mg total) by mouth daily with breakfast. (Patient not taking: No sig reported) 90 tablet 3   No facility-administered medications prior to visit.    ROS See HPI  Objective:  BP (!) 160/110 (BP Location: Right Arm, Patient Position: Sitting)   Pulse 74   Temp (!)Marland Kitchen  97.1 F (36.2 C) (Temporal)   Ht '5\' 7"'  (1.702 m)   Wt (!) 321 lb 12.8 oz (146 kg)   SpO2 98%   BMI 50.40 kg/m   Physical Exam Constitutional:      Appearance: She is obese.  Cardiovascular:     Rate and Rhythm: Normal rate and regular rhythm.     Pulses: Normal pulses.     Heart sounds: Normal heart sounds.  Pulmonary:     Effort: Pulmonary effort is normal.     Breath sounds: Normal breath sounds.  Musculoskeletal:     Cervical back: Normal range of  motion and neck supple.     Right lower leg: No edema.     Left lower leg: No edema.  Neurological:     Mental Status: She is alert and oriented to person, place, and time.  Psychiatric:        Mood and Affect: Mood normal.        Behavior: Behavior normal.        Thought Content: Thought content normal.    Assessment & Plan:  This visit occurred during the SARS-CoV-2 public health emergency.  Safety protocols were in place, including screening questions prior to the visit, additional usage of staff PPE, and extensive cleaning of exam room while observing appropriate contact time as indicated for disinfecting solutions.   Mary Moyer was seen today for follow-up.  Diagnoses and all orders for this visit:  Type 2 diabetes mellitus with hyperglycemia, without long-term current use of insulin (HCC) -     Hemoglobin A1c -     Basic metabolic panel -     glipiZIDE (GLUCOTROL XL) 10 MG 24 hr tablet; Take 1 tablet (10 mg total) by mouth daily. With heaviest meal -     metFORMIN (GLUCOPHAGE-XR) 750 MG 24 hr tablet; Take 1 tablet (750 mg total) by mouth daily with breakfast.  Essential hypertension -     Basic metabolic panel -     lisinopril (ZESTRIL) 10 MG tablet; Take 1 tablet (10 mg total) by mouth daily.  Hypothyroidism, unspecified type -     T4, free -     TSH  Mixed hyperlipidemia -     Lipid panel -     atorvastatin (LIPITOR) 20 MG tablet; Take 1 tablet (20 mg total) by mouth 3 (three) times a week.  Elevated LFTs -     Hepatic function panel -     Ambulatory referral to Gastroenterology  Iron deficiency anemia due to chronic blood loss -     Ferritin  Please call endocrinology: [phone] Call [facility] to schedule ABD [place]: [phone] You are due to your annual diabetic eye exam in June  Problem List Items Addressed This Visit      Cardiovascular and Mediastinum   Essential hypertension    Non compliant with medication Reports she has made dietary changes: low  sodium and low sugar. BP Readings from Last 3 Encounters:  03/19/20 (!) 160/110  11/06/19 118/78  07/28/19 128/82   Resume lisinopril at 42m  repeat BMP Advised about the importance of medication compliance and possible complication from uncontrolled HTN       Relevant Medications   lisinopril (ZESTRIL) 10 MG tablet   atorvastatin (LIPITOR) 20 MG tablet (Start on 03/20/2020)   Other Relevant Orders   Basic metabolic panel (Completed)     Endocrine   [place]abetes mellitus) [place]    Intermittent diarrhea with metformin 8563mBID, so  she takes med only once a day Noncompliant with glipizide. Did not schedule appt with endocrinology.  Advised Mary Moyer about possible complications of uncontrolled DM and importance of medication compliance.   Slightly improvement in HgbA1c: 11.5 to 10.7%. this is still not at goal so resume glipizide. I changed metformin to 780m XR daily to improve GI side effects. F/up in 339monthAdvised to schedule appt with endocrinology      Relevant Medications   lisinopril (ZESTRIL) 10 MG tablet   glipiZIDE (GLUCOTROL XL) 10 MG 24 hr tablet   metFORMIN (GLUCOPHAGE-XR) 750 MG 24 hr tablet   atorvastatin (LIPITOR) 20 MG tablet (Start on 03/20/2020)   Other Relevant Orders   Hemoglobin A1c (Completed)   Basic metabolic panel (Completed)   Hypothyroidism    Repeat TSH and T4: no change Advised to schedule appt with endocrinology ASAP      Relevant Orders   T4, free (Completed)   TSH (Completed)     Other   Elevated LFTs    No improvement in AST and ALT No nausea or ABD pain, no jaundice Advised to schedule appt for ABD [place] entered referral to GI      Relevant Orders   Hepatic function panel (Completed)   Ambulatory referral to Gastroenterology   Hyperlipidemia    Repeat lipid panel and hepatic panel: no improvement without statin Resume atorvastatin at 2028mx/week  repeat LFTs in 24mo77month   Relevant Medications    lisinopril (ZESTRIL) 10 MG tablet   atorvastatin (LIPITOR) 20 MG tablet (Start on 03/20/2020)   Other Relevant Orders   Lipid panel (Completed)   Iron deficiency anemia due to chronic blood loss   Relevant Orders   Ferritin (Completed)      Follow-up: Return in about 6 months (around 09/19/2020) for CPE (fasting, breast and pelvic exam).  CharWilfred Lacy

## 2020-03-19 NOTE — Assessment & Plan Note (Addendum)
No improvement in AST and ALT No nausea or ABD pain, no jaundice Advised to schedule appt for ABD [place] and entered referral to GI

## 2020-03-19 NOTE — Patient Instructions (Addendum)
Please call endocrinology: [phone] Call [facility] to schedule ABD [place]: [phone] You are due to your annual diabetic eye exam in June  Normal ferritin Abnormal lipid panel: start lipitor to help decrease your risk for cardiovascular disease. New rx sent. Abnormal TSH and T4: schedule appt with endocrinology as discussed. Elevated liver enzymes: schedule appt for ABD [place] as discussed. Also entered referral to GI. Slightly improvement in HgbA1c: 11.5 to 10.7%. this is still not at goal so resume glipizide. I changed metformin to XR to improve GI side effects. F/up in 40months Resume lisinopril.

## 2020-03-19 NOTE — Assessment & Plan Note (Signed)
Repeat lipid panel and hepatic panel: no improvement without statin Resume atorvastatin at 20mg  3x/week  repeat LFTs in 69months

## 2020-03-19 NOTE — Assessment & Plan Note (Signed)
Non compliant with medication Reports she has made dietary changes: low sodium and low sugar. BP Readings from Last 3 Encounters:  03/19/20 (!) 160/110  11/06/19 118/78  07/28/19 128/82   Resume lisinopril at 10mg   repeat BMP Advised about the importance of medication compliance and possible complication from uncontrolled HTN

## 2020-03-19 NOTE — Assessment & Plan Note (Addendum)
Repeat TSH and T4: no change Advised to schedule appt with endocrinology ASAP

## 2020-03-19 NOTE — Assessment & Plan Note (Addendum)
Intermittent diarrhea with metformin 850mg  BID, so she takes med only once a day Noncompliant with glipizide. Did not schedule appt with endocrinology.  Advised Mary Moyer about possible complications of uncontrolled DM and importance of medication compliance.   Slightly improvement in HgbA1c: 11.5 to 10.7%. this is still not at goal so resume glipizide. I changed metformin to 750mg  XR daily to improve GI side effects. F/up in 45months Advised to schedule appt with endocrinology

## 2020-05-23 ENCOUNTER — Encounter: Payer: Self-pay | Admitting: Physician Assistant

## 2020-06-18 ENCOUNTER — Ambulatory Visit: Payer: BC Managed Care – PPO | Admitting: Physician Assistant

## 2020-06-18 ENCOUNTER — Other Ambulatory Visit (INDEPENDENT_AMBULATORY_CARE_PROVIDER_SITE_OTHER): Payer: BC Managed Care – PPO

## 2020-06-18 ENCOUNTER — Other Ambulatory Visit: Payer: Self-pay

## 2020-06-18 ENCOUNTER — Encounter: Payer: Self-pay | Admitting: Physician Assistant

## 2020-06-18 VITALS — BP 128/84 | HR 80 | Ht 67.0 in | Wt 313.5 lb

## 2020-06-18 DIAGNOSIS — R7989 Other specified abnormal findings of blood chemistry: Secondary | ICD-10-CM

## 2020-06-18 DIAGNOSIS — K5900 Constipation, unspecified: Secondary | ICD-10-CM

## 2020-06-18 DIAGNOSIS — Z1212 Encounter for screening for malignant neoplasm of rectum: Secondary | ICD-10-CM | POA: Diagnosis not present

## 2020-06-18 DIAGNOSIS — Z1211 Encounter for screening for malignant neoplasm of colon: Secondary | ICD-10-CM | POA: Diagnosis not present

## 2020-06-18 LAB — COMPREHENSIVE METABOLIC PANEL
ALT: 207 U/L — ABNORMAL HIGH (ref 0–35)
AST: 143 U/L — ABNORMAL HIGH (ref 0–37)
Albumin: 4.6 g/dL (ref 3.5–5.2)
Alkaline Phosphatase: 63 U/L (ref 39–117)
BUN: 11 mg/dL (ref 6–23)
CO2: 25 mEq/L (ref 19–32)
Calcium: 9.7 mg/dL (ref 8.4–10.5)
Chloride: 96 mEq/L (ref 96–112)
Creatinine, Ser: 0.7 mg/dL (ref 0.40–1.20)
GFR: 102.14 mL/min (ref >60.00)
Glucose, Bld: 341 mg/dL — ABNORMAL HIGH (ref 70–99)
Potassium: 4.3 mEq/L (ref 3.5–5.1)
Sodium: 131 mEq/L — ABNORMAL LOW (ref 135–145)
Total Bilirubin: 0.4 mg/dL (ref 0.2–1.2)
Total Protein: 8 g/dL (ref 6.0–8.3)

## 2020-06-18 LAB — CBC WITH DIFFERENTIAL/PLATELET
Basophils Absolute: 0 10*3/uL (ref 0.0–0.1)
Basophils Relative: 0.7 % (ref 0.0–3.0)
Eosinophils Absolute: 0.1 10*3/uL (ref 0.0–0.7)
Eosinophils Relative: 1.3 % (ref 0.0–5.0)
HCT: 41.2 % (ref 36.0–46.0)
Hemoglobin: 13.8 g/dL (ref 12.0–15.0)
Lymphocytes Relative: 25.3 % (ref 12.0–46.0)
Lymphs Abs: 1.6 10*3/uL (ref 0.7–4.0)
MCHC: 33.6 g/dL (ref 30.0–36.0)
MCV: 85.1 fl (ref 78.0–100.0)
Monocytes Absolute: 0.4 10*3/uL (ref 0.1–1.0)
Monocytes Relative: 6.5 % (ref 3.0–12.0)
Neutro Abs: 4.1 10*3/uL (ref 1.4–7.7)
Neutrophils Relative %: 66.2 % (ref 43.0–77.0)
Platelets: 228 10*3/uL (ref 150.0–400.0)
RBC: 4.84 Mil/uL (ref 3.87–5.11)
RDW: 14.4 % (ref 11.5–15.5)
WBC: 6.2 10*3/uL (ref 4.0–10.5)

## 2020-06-18 LAB — PROTIME-INR
INR: 1.1 ratio — ABNORMAL HIGH (ref 0.8–1.0)
Prothrombin Time: 11.9 s (ref 9.6–13.1)

## 2020-06-18 LAB — IBC PANEL
Iron: 108 ug/dL (ref 42–145)
Saturation Ratios: 21.7 % (ref 20.0–50.0)
Transferrin: 355 mg/dL (ref 212.0–360.0)

## 2020-06-18 LAB — FERRITIN: Ferritin: 81.9 ng/mL (ref 10.0–291.0)

## 2020-06-18 NOTE — Patient Instructions (Signed)
If you are age 49 or older, your body mass index should be between 23-30. Your Body mass index is 49.1 kg/m. If this is out of the aforementioned range listed, please consider follow up with your Primary Care Provider.  If you are age 56 or younger, your body mass index should be between 19-25. Your Body mass index is 49.1 kg/m. If this is out of the aformentioned range listed, please consider follow up with your Primary Care Provider.   Your provider has requested that you go to the basement level for lab work before leaving today. Press "B" on the elevator. The lab is located at the first door on the left as you exit the elevator.   Use Miralax as needed.  You have been scheduled for an abdominal ultrasound at [facility] (1st floor of hospital) on 06/26/20 at 11am. Please arrive 15 minutes prior to your appointment for registration. Make certain not to have anything to eat or drink 6 hours prior to your appointment. Should you need to reschedule your appointment, please contact radiology at [phone]. This test typically takes about 30 minutes to perform.   The [facility] providers would like to encourage you to use [facility] to communicate with providers for non-urgent requests or questions.  Due to long hold times on the telephone, sending your provider a message by [facility] may be a faster and more efficient way to get a response.  Please allow 48 business hours for a response.  Please remember that this is for non-urgent requests.   Due to recent changes in healthcare laws, you may see the results of your imaging and laboratory studies on MyChart before your provider has had a chance to review them.  We understand that in some cases there may be results that are confusing or concerning to you. Not all laboratory results come back in the same time frame and the provider may be waiting for multiple results in order to interpret others.  Please give [place] 48 hours in order for your provider  to thoroughly review all the results before contacting the office for clarification of your results.   It was a pleasure to see you today!  Thank you for trusting me with your gastrointestinal care!     Ellouise Newer, PA-C

## 2020-06-18 NOTE — Progress Notes (Signed)
Chief Complaint: Elevated LFTs  HPI:    Mary Moyer is a 50 year old female with a past medical history of diabetes and others listed below, who was referred to me by Nche, Charlene Brooke, NP for a complaint of elevated LFTs.      03/19/2020 office visit with PCP.  At that time there was no improvement in AST and ALT, described no nausea, abdominal pain or jaundice.  She was advised to schedule appointment for abdominal ultrasound and referred to [place].    03/22/2018 normal AST and ALT--> 07/28/2019 AST 142, ALT 158--> 11/06/2019 AST 116, ALT 150--> 11/15/2019 AST 115, ALT 159--> 03/19/2020 AST 156, ALT 185.    Today, patient explains that she has been told that her liver enzymes are increasing.  Describes a family history of fatty liver and is assuming she has the same.  Denies any family history of autoimmune liver disease, hepatitis, IV drug use or tattoos.  She is not a heavy alcohol drinker.    Also describes chronic reflux symptoms telling me she will have 1 bowel movement a day but often times is uncomfortable in between.  Apparently tried MiraLAX 1 time in the distant past when she was on a low calorie diet and it gave her "explosive diarrhea".    Also asked the question in regards to her and even pannus.    Denies fever, chills, weight loss, blood in her stool, heartburn, reflux or abdominal pain.  Past Medical History:  Diagnosis Date   Anxiety    Arthritis    Depression    Diabetes ([place])    Headache    Hypertension    Migraines    Thyroid disease    UTI (urinary tract infection)     Past Surgical History:  Procedure Laterality Date   UTERINE FIBROID SURGERY  2003    Current Outpatient Medications  Medication Sig Dispense Refill   blood glucose meter kit and supplies KIT Dispense based on patient and insurance preference. Use once daily before breakfast. E11.65 1 each 0   glipiZIDE (GLUCOTROL XL) 10 MG 24 hr tablet Take 1 tablet (10 mg total) by mouth daily. With heaviest meal 90  tablet 3   levothyroxine (SYNTHROID) 200 MCG tablet Take 1 tablet (200 mcg total) by mouth daily before breakfast. 90 tablet 1   lisinopril (ZESTRIL) 10 MG tablet Take 1 tablet (10 mg total) by mouth daily. 90 tablet 3   metFORMIN (GLUCOPHAGE-XR) 750 MG 24 hr tablet Take 1 tablet (750 mg total) by mouth daily with breakfast. 90 tablet 3   No current facility-administered medications for this visit.    Allergies as of 06/18/2020 - Review Complete 06/18/2020  Allergen Reaction Noted   Amlodipine Other (See Comments) 03/30/2018   Orange fruit [citrus]  03/22/2018   Latex Rash 03/22/2018    Family History  Problem Relation Age of Onset   Kidney disease Mother    Arthritis Mother    Hypertension Mother    Miscarriages / [place] Mother    Depression Mother    Diabetes Father    Heart attack Father    Heart disease Father    Hyperlipidemia Father    Hypertension Father    Depression Sister    Heart disease Sister    Hypertension Sister    Kidney disease Maternal Grandmother    Miscarriages / Stillbirths Maternal Grandmother    Hypertension Paternal Grandmother    Heart attack Paternal Grandmother    Cancer Paternal Grandfather  Diabetes Paternal Grandfather    Hypertension Paternal Grandfather    Asthma Daughter    Depression Daughter    Diabetes Daughter    Hypertension Daughter    37 / [place] Daughter    Colon cancer Neg Hx    Esophageal cancer Neg Hx    Pancreatic cancer Neg Hx    Stomach cancer Neg Hx     Social History   Socioeconomic History   Marital status: Divorced    Spouse name: Not on file   Number of children: 0   Years of education: Not on file   Highest education level: Not on file  Occupational History   Not on file  Tobacco Use   Smoking status: Never   Smokeless tobacco: Never  Vaping Use   Vaping Use: Never used  Substance and Sexual Activity   Alcohol use: Not Currently   Drug use: Never   Sexual activity: Yes     Birth control/protection: None  Other Topics Concern   Not on file  Social History Narrative   Not on file   Social Determinants of Health   Financial Resource Strain: Not on file  Food Insecurity: Not on file  Transportation Needs: Not on file  Physical Activity: Not on file  Stress: Not on file  Social Connections: Not on file  Intimate Partner Violence: Not on file    Review of Systems:    Constitutional: No weight loss, fever or chills Skin: No rash  Cardiovascular: No chest pain Respiratory: No SOB  Gastrointestinal: See HPI and otherwise negative Genitourinary: No dysuria  Neurological: No headache, dizziness or syncope Musculoskeletal: No new muscle or joint pain Hematologic: No bleeding  Psychiatric: No history of depression or anxiety   Physical Exam:  Vital signs: BP 128/84 (BP Location: Left Arm, Patient Position: Sitting, Cuff Size: Large)   Pulse 80   Ht _0  (1.702 m)   Wt (!) 313 lb 8 oz (142.2 kg)   SpO2 98%   BMI 49.10 kg/m    Constitutional:   Pleasant obese Caucasian female appears to be in NAD, Well developed, Well nourished, alert and cooperative Head:  Normocephalic and atraumatic. Eyes:   PEERL, EOMI. No icterus. Conjunctiva pink. Ears:  Normal auditory acuity. Neck:  Supple Throat: Oral cavity and pharynx without inflammation, swelling or lesion.  Respiratory: Respirations even and unlabored. Lungs clear to auscultation bilaterally.   No wheezes, crackles, or rhonchi.  Cardiovascular: Normal S1, S2. No MRG. Regular rate and rhythm. No peripheral edema, cyanosis or pallor.  Gastrointestinal:  Soft, nondistended, nontender. No rebound or guarding. Normal bowel sounds. No appreciable masses or hepatomegaly. Rectal:  Not performed.  Msk:  Symmetrical without gross deformities. Without edema, no deformity or joint abnormality.  Neurologic:  Alert and  oriented x4;  grossly normal neurologically.  Skin:   Dry and intact without significant lesions  or rashes. Psychiatric: Demonstrates good judgement and reason without abnormal affect or behaviors.  RELEVANT LABS AND IMAGING: CBC    Component Value Date/Time   WBC 6.7 07/28/2019 0935   RBC 4.54 07/28/2019 0935   HGB 11.9 (L) 07/28/2019 0935   HCT 36.6 07/28/2019 0935   PLT 231.0 07/28/2019 0935   MCV 80.6 07/28/2019 0935   MCHC 32.4 07/28/2019 0935   RDW 16.0 (H) 07/28/2019 0935   LYMPHSABS 1.3 07/28/2019 0935   MONOABS 0.4 07/28/2019 0935   EOSABS 0.1 07/28/2019 0935   BASOSABS 0.0 07/28/2019 0935    CMP  Component Value Date/Time   NA 135 03/19/2020 0920   K 4.1 03/19/2020 0920   CL 98 03/19/2020 0920   CO2 28 03/19/2020 0920   GLUCOSE 257 (H) 03/19/2020 0920   BUN 11 03/19/2020 0920   CREATININE 0.73 03/19/2020 0920   CALCIUM 9.5 03/19/2020 0920   PROT 7.4 03/19/2020 0920   ALBUMIN 4.2 03/19/2020 0920   AST 156 (H) 03/19/2020 0920   ALT 185 (H) 03/19/2020 0920   ALKPHOS 67 03/19/2020 0920   BILITOT 0.4 03/19/2020 0920    Assessment: Elevated LFTs: Increasing over the past year or so and now remaining steady, no prior work-up; likely fatty liver Screening for colorectal cancer: Patient is 7 and never had a screening colonoscopy Constipation: Chronic for the patient; likely due to slow transit/diet  Plan: 1.  Ordered further liver serologies for the patient including repeat CBC, CMP, PT/INR, iron studies with ferritin, TTG, IgA, ANA, ASMA, AMA, alpha-1 antitrypsin, ceruloplasmin and hepatitis serologies.  When labs return can calculate fib 4 score. 2.  Ordered right upper quadrant ultrasound 3.  Reviewed fatty liver recommendations including decreasing weight by 1 to 2 pounds per week with a healthier diet. 4.  Discussed constipation, recommend MiraLAX, discussed titration of this, she could try just a teaspoon in her coffee in the mornings to see if this helps. 5.  Patient is due for a screening colonoscopy.  We will discuss this at time of her  follow-up. 6.  Patient to follow in clinic per recommendations after labs and imaging above.  Assigned to Dr. Loletha Carrow this morning.  Ellouise Newer, PA-C [facility] 06/18/2020, 11:45 AM  Cc: Flossie Buffy, NP

## 2020-06-18 NOTE — Progress Notes (Signed)
____________________________________________________________  Attending physician addendum:  Thank you for sending this case to me. I have reviewed the entire note and agree with the plan.  In addition, due to high volume of diagnostic procedures and endoscopy availability, we are currently booking screening colonoscopies out at least 3 months.  Wilfrid Lund, MD  ____________________________________________________________

## 2020-06-21 LAB — IGA: Immunoglobulin A: 371 mg/dL — ABNORMAL HIGH (ref 47–310)

## 2020-06-21 LAB — ALPHA-1-ANTITRYPSIN: A-1 Antitrypsin, Ser: 159 mg/dL (ref 83–199)

## 2020-06-21 LAB — HEPATITIS PANEL, ACUTE
Hep A IgM: NONREACTIVE
Hep B C IgM: NONREACTIVE
Hepatitis B Surface Ag: NONREACTIVE
Hepatitis C Ab: NONREACTIVE
SIGNAL TO CUT-OFF: 0.03 (ref <1.00)

## 2020-06-21 LAB — ANTI-NUCLEAR AB-TITER (ANA TITER): ANA Titer 1: 1:80 {titer} — ABNORMAL HIGH

## 2020-06-21 LAB — ANTI-SMOOTH MUSCLE ANTIBODY, IGG: Actin (Smooth Muscle) Antibody (IGG): <20 U (ref <20)

## 2020-06-21 LAB — MITOCHONDRIAL ANTIBODIES: Mitochondrial M2 Ab, IgG: <=20 U

## 2020-06-21 LAB — TISSUE TRANSGLUTAMINASE, IGA: (tTG) Ab, IgA: 4 U/mL

## 2020-06-21 LAB — CERULOPLASMIN: Ceruloplasmin: 36 mg/dL (ref 18–53)

## 2020-06-21 LAB — ANA: Anti Nuclear Antibody (ANA): POSITIVE — AB

## 2020-06-24 ENCOUNTER — Other Ambulatory Visit: Payer: Self-pay

## 2020-06-24 ENCOUNTER — Telehealth: Payer: Self-pay | Admitting: Nurse Practitioner

## 2020-06-24 DIAGNOSIS — E039 Hypothyroidism, unspecified: Secondary | ICD-10-CM

## 2020-06-24 DIAGNOSIS — R7989 Other specified abnormal findings of blood chemistry: Secondary | ICD-10-CM

## 2020-06-24 DIAGNOSIS — R768 Other specified abnormal immunological findings in serum: Secondary | ICD-10-CM

## 2020-06-24 DIAGNOSIS — E1165 Type 2 diabetes mellitus with hyperglycemia: Secondary | ICD-10-CM

## 2020-06-24 DIAGNOSIS — K7460 Unspecified cirrhosis of liver: Secondary | ICD-10-CM

## 2020-06-24 DIAGNOSIS — E871 Hypo-osmolality and hyponatremia: Secondary | ICD-10-CM

## 2020-06-26 ENCOUNTER — Ambulatory Visit (HOSPITAL_COMMUNITY)
Admission: RE | Admit: 2020-06-26 | Discharge: 2020-06-26 | Disposition: A | Discharge status: Home / Self Care | Payer: BC Managed Care – PPO | Source: Ambulatory Visit | Attending: Nurse Practitioner | Admitting: Nurse Practitioner

## 2020-06-26 ENCOUNTER — Other Ambulatory Visit: Payer: Self-pay

## 2020-06-26 DIAGNOSIS — R7989 Other specified abnormal findings of blood chemistry: Secondary | ICD-10-CM | POA: Diagnosis not present

## 2020-06-26 DIAGNOSIS — K760 Fatty (change of) liver, not elsewhere classified: Secondary | ICD-10-CM | POA: Diagnosis not present

## 2020-06-27 ENCOUNTER — Telehealth: Payer: Self-pay | Admitting: Nurse Practitioner

## 2020-06-27 ENCOUNTER — Telehealth: Payer: Self-pay

## 2020-06-27 DIAGNOSIS — N2889 Other specified disorders of kidney and ureter: Secondary | ICD-10-CM | POA: Insufficient documentation

## 2020-06-27 DIAGNOSIS — K7689 Other specified diseases of liver: Secondary | ICD-10-CM

## 2020-06-27 NOTE — Telephone Encounter (Signed)
Informed Mary Moyer about ABD [place] finding (renal lesions and fatty liver). Advised about need for MRI abd.  She verbalized understanding and agreed with MRI order.

## 2020-06-27 NOTE — Addendum Note (Signed)
Addended by: Wilfred Lacy L on: 06/27/2020 10:23 AM   Modules accepted: Orders

## 2020-06-27 NOTE — Telephone Encounter (Signed)
Need to discuss [place] results and need for MRI ABD

## 2020-06-27 NOTE — Telephone Encounter (Signed)
Mary Moyer with [facility] called:   There is a 8.3 right renal mass. Concerning for malignancy. They recommend an MRI for further evaluation.  Call back phone [phone].

## 2020-06-27 NOTE — Telephone Encounter (Signed)
error 

## 2020-07-01 ENCOUNTER — Telehealth: Payer: Self-pay | Admitting: Hematology and Oncology

## 2020-07-01 NOTE — Telephone Encounter (Signed)
Received a new hem referral from Dr. Loletha Carrow for elevated LFTs. Mary Moyer has been cld and scheduled to see Dr. Lorenso Courier on 7/27 at 1pm. Pt aware to arrive 20 minutes early. Per pt she needed appt to be late in July due to starting a new job.

## 2020-07-11 ENCOUNTER — Encounter: Payer: Self-pay | Admitting: Endocrinology

## 2020-07-14 ENCOUNTER — Ambulatory Visit
Admission: RE | Admit: 2020-07-14 | Discharge: 2020-07-14 | Disposition: A | Discharge status: Home / Self Care | Payer: Self-pay | Source: Ambulatory Visit | Attending: Nurse Practitioner | Admitting: Nurse Practitioner

## 2020-07-14 DIAGNOSIS — K7689 Other specified diseases of liver: Secondary | ICD-10-CM

## 2020-07-14 DIAGNOSIS — N2889 Other specified disorders of kidney and ureter: Secondary | ICD-10-CM

## 2020-07-14 MED ORDER — GADOBENATE DIMEGLUMINE 529 MG/ML IV SOLN
20.0000 mL | Freq: Once | INTRAVENOUS | Status: AC | PRN
  Administered 2020-07-14: 20 mL via INTRAVENOUS

## 2020-07-15 ENCOUNTER — Telehealth: Payer: Self-pay | Admitting: Family Medicine

## 2020-07-15 DIAGNOSIS — N2889 Other specified disorders of kidney and ureter: Secondary | ICD-10-CM

## 2020-07-15 NOTE — Telephone Encounter (Signed)
Imaging: MRI of Abdomen (07/14/2020) IMPRESSION: 1. Enhancing 7.7 cm right upper pole renal mass which is suspicious for renal cell carcinoma. Recommend urology consult if not previously performed. 2. No evidence of tumor in vein or metastatic disease in the abdomen. 3. Hepatosplenomegaly with moderate hepatic steatosis.  1. Renal mass, right Ms. Crewe's PCP is away on leave. I called and spoke with Ms. Koo in regards to her MRI result, showing probable right renal cell carcinoma. I answered her questions. I will refer urgently to urology.  - Ambulatory referral to Urology  Haydee Salter, MD

## 2020-07-23 ENCOUNTER — Telehealth: Payer: Self-pay | Admitting: Nurse Practitioner

## 2020-07-23 NOTE — Telephone Encounter (Signed)
Pt called and states that home AZO UTI kit tested positive for UTI with NITs not LEUs. Pt asking if given the situation Baldo Ash can order a med for her?  [id number] [address], [place] - [address] Phone:  [phone]  Fax:  [phone]

## 2020-07-23 NOTE — Telephone Encounter (Addendum)
Pt called noting urinary incontinence that started this morning. Pt wanted a same day appt. Unfortunately none available. She will go to [facility] but wants to let Baldo Ash know about this. Pt worried due to recent diagnosis of kidney cancer. Pt states that she started a new job the day after her DX & cannot miss work or she'll lose her new job.

## 2020-07-24 NOTE — Telephone Encounter (Signed)
Are you willing to send in a Rx or does the patient need an appointment?

## 2020-07-25 NOTE — Telephone Encounter (Signed)
LVM for patient to return call. 

## 2020-07-31 ENCOUNTER — Other Ambulatory Visit: Payer: Self-pay

## 2020-07-31 ENCOUNTER — Encounter: Payer: Self-pay | Admitting: Hematology and Oncology

## 2020-07-31 ENCOUNTER — Inpatient Hospital Stay: Payer: BC Managed Care – PPO | Attending: Hematology and Oncology | Admitting: Hematology and Oncology

## 2020-07-31 ENCOUNTER — Inpatient Hospital Stay: Payer: BC Managed Care – PPO

## 2020-07-31 VITALS — BP 141/109 | HR 88 | Temp 97.4°F | Resp 17 | Wt 311.4 lb

## 2020-07-31 DIAGNOSIS — E119 Type 2 diabetes mellitus without complications: Secondary | ICD-10-CM | POA: Diagnosis not present

## 2020-07-31 DIAGNOSIS — Z803 Family history of malignant neoplasm of breast: Secondary | ICD-10-CM | POA: Diagnosis not present

## 2020-07-31 DIAGNOSIS — K5730 Diverticulosis of large intestine without perforation or abscess without bleeding: Secondary | ICD-10-CM | POA: Insufficient documentation

## 2020-07-31 DIAGNOSIS — Z7984 Long term (current) use of oral hypoglycemic drugs: Secondary | ICD-10-CM | POA: Diagnosis not present

## 2020-07-31 DIAGNOSIS — K760 Fatty (change of) liver, not elsewhere classified: Secondary | ICD-10-CM | POA: Insufficient documentation

## 2020-07-31 DIAGNOSIS — I10 Essential (primary) hypertension: Secondary | ICD-10-CM | POA: Diagnosis not present

## 2020-07-31 DIAGNOSIS — N2889 Other specified disorders of kidney and ureter: Secondary | ICD-10-CM

## 2020-07-31 DIAGNOSIS — R768 Other specified abnormal immunological findings in serum: Secondary | ICD-10-CM | POA: Insufficient documentation

## 2020-07-31 DIAGNOSIS — Z79899 Other long term (current) drug therapy: Secondary | ICD-10-CM | POA: Diagnosis not present

## 2020-07-31 DIAGNOSIS — D472 Monoclonal gammopathy: Secondary | ICD-10-CM

## 2020-07-31 DIAGNOSIS — R162 Hepatomegaly with splenomegaly, not elsewhere classified: Secondary | ICD-10-CM | POA: Insufficient documentation

## 2020-07-31 DIAGNOSIS — E079 Disorder of thyroid, unspecified: Secondary | ICD-10-CM | POA: Insufficient documentation

## 2020-07-31 LAB — CBC WITH DIFFERENTIAL (CANCER CENTER ONLY)
Abs Immature Granulocytes: 0.04 10*3/uL (ref 0.00–0.07)
Basophils Absolute: 0 10*3/uL (ref 0.0–0.1)
Basophils Relative: 0 %
Eosinophils Absolute: 0.1 10*3/uL (ref 0.0–0.5)
Eosinophils Relative: 1 %
HCT: 39.9 % (ref 36.0–46.0)
Hemoglobin: 13.1 g/dL (ref 12.0–15.0)
Immature Granulocytes: 1 %
Lymphocytes Relative: 25 %
Lymphs Abs: 1.9 10*3/uL (ref 0.7–4.0)
MCH: 28.6 pg (ref 26.0–34.0)
MCHC: 32.8 g/dL (ref 30.0–36.0)
MCV: 87.1 fL (ref 80.0–100.0)
Monocytes Absolute: 0.5 10*3/uL (ref 0.1–1.0)
Monocytes Relative: 6 %
Neutro Abs: 5.1 10*3/uL (ref 1.7–7.7)
Neutrophils Relative %: 67 %
Platelet Count: 229 10*3/uL (ref 150–400)
RBC: 4.58 MIL/uL (ref 3.87–5.11)
RDW: 13.4 % (ref 11.5–15.5)
WBC Count: 7.6 10*3/uL (ref 4.0–10.5)
nRBC: 0 % (ref 0.0–0.2)

## 2020-07-31 LAB — CMP (CANCER CENTER ONLY)
ALT: 183 U/L — ABNORMAL HIGH (ref 0–44)
AST: 102 U/L — ABNORMAL HIGH (ref 15–41)
Albumin: 4.1 g/dL (ref 3.5–5.0)
Alkaline Phosphatase: 71 U/L (ref 38–126)
Anion gap: 12 (ref 5–15)
BUN: 13 mg/dL (ref 6–20)
CO2: 25 mmol/L (ref 22–32)
Calcium: 10.2 mg/dL (ref 8.9–10.3)
Chloride: 102 mmol/L (ref 98–111)
Creatinine: 0.81 mg/dL (ref 0.44–1.00)
GFR, Estimated: >60 mL/min (ref >60)
Glucose, Bld: 279 mg/dL — ABNORMAL HIGH (ref 70–99)
Potassium: 3.8 mmol/L (ref 3.5–5.1)
Sodium: 139 mmol/L (ref 135–145)
Total Bilirubin: 0.3 mg/dL (ref 0.3–1.2)
Total Protein: 7.9 g/dL (ref 6.5–8.1)

## 2020-07-31 LAB — LACTATE DEHYDROGENASE: LDH: 210 U/L — ABNORMAL HIGH (ref 98–192)

## 2020-07-31 NOTE — Progress Notes (Signed)
[place] [phone]   Fax:([phone]) [place] NOTE  Patient Care Team: Nche, Charlene Brooke, NP as PCP - General (Internal Medicine)  Hematological/Oncological History # Elevated IgA 06/18/2020: During GI evaluation patient was found to have IgA 371 (nml 47-310) 07/31/2020: establish care with Dr. Lorenso Courier   CHIEF COMPLAINTS/PURPOSE OF CONSULTATION:  "Elevated IgA "  HISTORY OF PRESENTING ILLNESS:  Mary Moyer 50 y.o. female with medical history significant for anxiety, arthritis, depression, diabetes, hypertension, thyroid disease, and migraines who presents for evaluation of elevated IgA.  On review of the previous records Mary Moyer was seen in evaluation by GI at which time they were ordering numerous antibody studies.  As part of their evaluation they ordered an IgA level which was found to be 371, up from the reference range of 47-310.  Due to concern for this finding the patient was referred to hematology for further evaluation and management.  On exam today Mary Moyer accompanied by her Sister Mary Moyer.  She notes that she is here for "something weird".  She notes that she has a mass on her kidney for which she has an appointment coming up with urology on 09/02/2020.  She also notes that there is an issue with her blood.  She reports that this all began when her insulin levels "went crazy".  As part of her evaluation she was sent to gastroenterology who ordered tissue transglutaminase and an IgA level.  His IgA level was elevated which is the reason why she is here today.  On further discussion she notes that she has been feeling well.  She denies having any issues with fevers, chills, sweats, nausea, vomiting or diarrhea.  She denies any weight loss or new bone or back pain.  She has a family history remarkable for lung cancer in her maternal grandfather and breast cancer in her maternal aunt and maternal grandmother.  She also reports that she  had an aunt with sickle cell but is unsure if this is accurate.  She is a never smoker and does not drink.  A full 10 point ROS is listed below.  MEDICAL HISTORY:  Past Medical History:  Diagnosis Date   Anxiety    Arthritis    Depression    Diabetes ([place])    Headache    Hypertension    Migraines    Thyroid disease    UTI (urinary tract infection)     SURGICAL HISTORY: Past Surgical History:  Procedure Laterality Date   UTERINE FIBROID SURGERY  2003    SOCIAL HISTORY: Social History   Socioeconomic History   Marital status: Divorced    Spouse name: Not on file   Number of children: 0   Years of education: Not on file   Highest education level: Not on file  Occupational History   Not on file  Tobacco Use   Smoking status: Never   Smokeless tobacco: Never  Vaping Use   Vaping Use: Never used  Substance and Sexual Activity   Alcohol use: Not Currently   Drug use: Never   Sexual activity: Yes    Birth control/protection: None  Other Topics Concern   Not on file  Social History Narrative   Not on file   Social Determinants of Health   Financial Resource Strain: Not on file  Food Insecurity: Not on file  Transportation Needs: Not on file  Physical Activity: Not on file  Stress: Not on file  Social Connections: Not on file  Intimate Partner Violence: Not on file    FAMILY HISTORY: Family History  Problem Relation Age of Onset   Kidney disease Mother    Arthritis Mother    Hypertension Mother    4 / [place] Mother    Depression Mother    Diabetes Father    Heart attack Father    Heart disease Father    Hyperlipidemia Father    Hypertension Father    Depression Sister    Heart disease Sister    Hypertension Sister    Kidney disease Maternal Grandmother    Miscarriages / Stillbirths Maternal Grandmother    Hypertension Paternal Grandmother    Heart attack Paternal Grandmother    Cancer Paternal Grandfather    Diabetes Paternal  Grandfather    Hypertension Paternal Grandfather    Asthma Daughter    Depression Daughter    Diabetes Daughter    Hypertension Daughter    Miscarriages / [place] Daughter    Colon cancer Neg Hx    Esophageal cancer Neg Hx    Pancreatic cancer Neg Hx    Stomach cancer Neg Hx     ALLERGIES:  is allergic to amlodipine, orange fruit [citrus], and latex.  MEDICATIONS:  Current Outpatient Medications  Medication Sig Dispense Refill   blood glucose meter kit and supplies KIT Dispense based on patient and insurance preference. Use once daily before breakfast. E11.65 1 each 0   glipiZIDE (GLUCOTROL XL) 10 MG 24 hr tablet Take 1 tablet (10 mg total) by mouth daily. With heaviest meal 90 tablet 3   levothyroxine (SYNTHROID) 200 MCG tablet Take 1 tablet (200 mcg total) by mouth daily before breakfast. 90 tablet 1   lisinopril (ZESTRIL) 10 MG tablet Take 1 tablet (10 mg total) by mouth daily. 90 tablet 3   metFORMIN (GLUCOPHAGE-XR) 750 MG 24 hr tablet Take 1 tablet (750 mg total) by mouth daily with breakfast. 90 tablet 3   No current facility-administered medications for this visit.    REVIEW OF SYSTEMS:   Constitutional: ( - ) fevers, ( - )  chills , ( - ) night sweats Eyes: ( - ) blurriness of vision, ( - ) double vision, ( - ) watery eyes Ears, nose, mouth, throat, and face: ( - ) mucositis, ( - ) sore throat Respiratory: ( - ) cough, ( - ) dyspnea, ( - ) wheezes Cardiovascular: ( - ) palpitation, ( - ) chest discomfort, ( - ) lower extremity swelling Gastrointestinal:  ( - ) nausea, ( - ) heartburn, ( - ) change in bowel habits Skin: ( - ) abnormal skin rashes Lymphatics: ( - ) new lymphadenopathy, ( - ) easy bruising Neurological: ( - ) numbness, ( - ) tingling, ( - ) new weaknesses Behavioral/Psych: ( - ) mood change, ( - ) new changes  All other systems were reviewed with the patient and are negative.  PHYSICAL EXAMINATION: ECOG PERFORMANCE STATUS: 0 - Asymptomatic  Vitals:    07/31/20 1416  BP: (!) 141/109  Pulse: 88  Resp: 17  Temp: (!) 97.4 F (36.3 C)  SpO2: 98%   Filed Weights   07/31/20 1416  Weight: (!) 311 lb 6.4 oz (141.3 kg)    GENERAL: well appearing middle aged Caucasian female in NAD  SKIN: skin color, texture, turgor are normal, no rashes or significant lesions EYES: conjunctiva are pink and non-injected, sclera clear LUNGS: clear to auscultation and percussion with normal breathing effort HEART: regular rate & rhythm and no murmurs  and no lower extremity edema PSYCH: alert & oriented x 3, fluent speech NEURO: no focal motor/sensory deficits  LABORATORY DATA:  I have reviewed the data as listed CBC Latest Ref Rng & Units 07/31/2020 06/18/2020 07/28/2019  WBC 4.0 - 10.5 K/uL 7.6 6.2 6.7  Hemoglobin 12.0 - 15.0 g/dL 13.1 13.8 11.9(L)  Hematocrit 36.0 - 46.0 % 39.9 41.2 36.6  Platelets 150 - 400 K/uL 229 228.0 231.0    CMP Latest Ref Rng & Units 07/31/2020 06/18/2020 03/19/2020  Glucose 70 - 99 mg/dL 279(H) 341(H) 257(H)  BUN 6 - 20 mg/dL '13 11 11  ' Creatinine 0.44 - 1.00 mg/dL 0.81 0.70 0.73  Sodium 135 - 145 mmol/L 139 131(L) 135  Potassium 3.5 - 5.1 mmol/L 3.8 4.3 4.1  Chloride 98 - 111 mmol/L 102 96 98  CO2 22 - 32 mmol/L '25 25 28  ' Calcium 8.9 - 10.3 mg/dL 10.2 9.7 9.5  Total Protein 6.5 - 8.1 g/dL 7.9 8.0 7.4  Total Bilirubin 0.3 - 1.2 mg/dL 0.3 0.4 0.4  Alkaline Phos 38 - 126 U/L 71 63 67  AST 15 - 41 U/L 102(H) 143(H) 156(H)  ALT 0 - 44 U/L 183(H) 207(H) 185(H)    RADIOGRAPHIC STUDIES: I have personally reviewed the radiological images as listed and agreed with the findings in the report: right renal mass concerning for RCC. MR Abdomen W Wo Contrast  Result Date: 07/14/2020 CLINICAL DATA:  Further characterization of known right renal lesion. EXAM: MRI ABDOMEN WITHOUT AND WITH CONTRAST TECHNIQUE: Multiplanar multisequence MR imaging of the abdomen was performed both before and after the administration of intravenous contrast.  CONTRAST:  24m MULTIHANCE GADOBENATE DIMEGLUMINE 529 MG/ML IV SOLN COMPARISON:  Ultrasound June 26, 2020 FINDINGS: Lower chest: No acute abnormality. Hepatobiliary: Simple appearing 11.5 cm cyst in the posterior right lobe of the liver. Hepatomegaly measuring 24 cm. There is moderate diffuse loss of signal throughout the hepatic parenchyma on out of phase imaging, consistent with hepatic steatosis. No solid enhancing hepatic lesions. Pancreas: Normal intrinsic T1 signal in the pancreatic parenchyma. No pancreatic ductal dilation. No cystic or arterially enhancing pancreatic masses. Spleen: Mild splenomegaly measuring 14.5 cm in maximum axial dimension. Adrenals/Urinary Tract:  Bilateral adrenal glands are unremarkable. No hydronephrosis.  Left kidney is unremarkable. Enhancing large right upper pole renal mass measuring 7.7 x 7.5 by 6.1 cm which demonstrates heterogeneous intrinsic T1 and T2 signal without evidence of signal loss on fat saturation sequences. No definite finding of tumor in vein. Stomach/Bowel: Stomach is grossly unremarkable. No pathologic dilation of small bowel. Colonic diverticulosis without findings of acute diverticulitis. Vascular/Lymphatic: No abdominopelvic ascites. The renal veins appear patent without definite evidence of tumor in vein. No pathologically enlarged abdominal lymph nodes Other:  No abdominal ascites. Musculoskeletal: No suspicious bone lesions identified. IMPRESSION: 1. Enhancing 7.7 cm right upper pole renal mass which is suspicious for renal cell carcinoma. Recommend urology consult if not previously performed. 2. No evidence of tumor in vein or metastatic disease in the abdomen. 3. Hepatosplenomegaly with moderate hepatic steatosis. Electronically Signed   By: JDahlia BailiffMD   On: 07/14/2020 16:51    ASSESSMENT & PLAN Mary Moyer 50y.o. female with medical history significant for anxiety, arthritis, depression, diabetes, hypertension, thyroid disease, and  migraines who presents for evaluation of elevated IgA.  After review of the labs, review of the records, and discussion with the patient the patients findings are most consistent with a mild IgA elevation and a renal mass (concerning  for RCC). We will do a monoclonal gammopathy workup on his IgA, though low suspicion this represents an MGUS/MM. In terms of the renal mass the patient does have a clinic visit scheduled with urology in 4 weeks time, though she would like a more prompt visit. Therefore we will pursue a new consult with [facility] urology.   #IgA Elevation -- mild elevation, unlikely to be of clinical significance --will order SPEP, UPEP, SFLC, LDH, and Beta-2 microglobulin to assess for  --if there is concern for a monoclonal protein will pursue BmBx --schedule RTC in 3 months time   # Renal Mass Concerning for RCC --patient has referral to [facility], scheduled to see them on 09/02/2020 --the patient would like to be seen sooner. Will make referral to [facility] to see if a more prompt visit can be scheduled.  --we can take care of follow up management of this patient if patient undergoes nephrectomy and is found to have RCC --schedule RTC in 3 months time   Orders Placed This Encounter  Procedures   CBC with Differential ([place] Only)    Standing Status:   Future    Number of Occurrences:   1    Standing Expiration Date:   07/31/2021   CMP ([place] only)    Standing Status:   Future    Number of Occurrences:   1    Standing Expiration Date:   07/31/2021   Lactate dehydrogenase (LDH)    Standing Status:   Future    Number of Occurrences:   1    Standing Expiration Date:   07/31/2021   Multiple Myeloma Panel (SPEP&IFE w/QIG)    Standing Status:   Future    Number of Occurrences:   1    Standing Expiration Date:   07/31/2021   Kappa/lambda light chains    Standing Status:   Future    Number of Occurrences:   1    Standing Expiration Date:   07/31/2021    Beta 2 microglobulin    Standing Status:   Future    Number of Occurrences:   1    Standing Expiration Date:   07/31/2021   Ambulatory referral to Urology    Referral Priority:   Routine    Referral Type:   Consultation    Referral Reason:   Specialty Services Required    Requested Specialty:   Urology    Number of Visits Requested:   1    All questions were answered. The patient knows to call the clinic with any problems, questions or concerns.  A total of more than 60 minutes were spent on this encounter with face-to-face time and non-face-to-face time, including preparing to see the patient, ordering tests and/or medications, counseling the patient and coordination of care as outlined above.   Ledell Peoples, MD Department of Hematology/Oncology [place] [facility] Phone: [phone] Pager: [phone] Email: [email or web address]  07/31/2020 4:31 PM

## 2020-08-01 LAB — KAPPA/LAMBDA LIGHT CHAINS
Kappa free light chain: 21 mg/L — ABNORMAL HIGH (ref 3.3–19.4)
Kappa, lambda light chain ratio: 1.17 (ref 0.26–1.65)
Lambda free light chains: 18 mg/L (ref 5.7–26.3)

## 2020-08-01 LAB — BETA 2 MICROGLOBULIN, SERUM: Beta-2 Microglobulin: 1.7 mg/L (ref 0.6–2.4)

## 2020-08-05 ENCOUNTER — Other Ambulatory Visit (HOSPITAL_COMMUNITY)
Admission: RE | Admit: 2020-08-05 | Discharge: 2020-08-05 | Disposition: A | Discharge status: Home / Self Care | Payer: BC Managed Care – PPO | Source: Ambulatory Visit | Attending: Hematology and Oncology | Admitting: Hematology and Oncology

## 2020-08-05 ENCOUNTER — Other Ambulatory Visit: Payer: Self-pay | Admitting: Hematology and Oncology

## 2020-08-05 ENCOUNTER — Other Ambulatory Visit: Payer: Self-pay | Admitting: *Deleted

## 2020-08-05 DIAGNOSIS — D472 Monoclonal gammopathy: Secondary | ICD-10-CM | POA: Insufficient documentation

## 2020-08-05 LAB — MULTIPLE MYELOMA PANEL, SERUM
Albumin SerPl Elph-Mcnc: 4 g/dL (ref 2.9–4.4)
Albumin/Glob SerPl: 1.3 (ref 0.7–1.7)
Alpha 1: 0.2 g/dL (ref 0.0–0.4)
Alpha2 Glob SerPl Elph-Mcnc: 0.9 g/dL (ref 0.4–1.0)
B-Globulin SerPl Elph-Mcnc: 1.3 g/dL (ref 0.7–1.3)
Gamma Glob SerPl Elph-Mcnc: 1 g/dL (ref 0.4–1.8)
Globulin, Total: 3.3 g/dL (ref 2.2–3.9)
IgA: 358 mg/dL — ABNORMAL HIGH (ref 87–352)
IgG (Immunoglobin G), Serum: 818 mg/dL (ref 586–1602)
IgM (Immunoglobulin M), Srm: 122 mg/dL (ref 26–217)
Total Protein ELP: 7.3 g/dL (ref 6.0–8.5)

## 2020-08-07 LAB — UPEP/UIFE/LIGHT CHAINS/TP, 24-HR UR
% BETA, Urine: 17.5 %
ALPHA 1 URINE: 6.2 %
Albumin, U: 51.9 %
Alpha 2, Urine: 9.3 %
Free Kappa Lt Chains,Ur: 49.01 mg/L (ref 1.17–86.46)
Free Kappa/Lambda Ratio: 7.88 (ref 1.83–14.26)
Free Lambda Lt Chains,Ur: 6.22 mg/L (ref 0.27–15.21)
GAMMA GLOBULIN URINE: 15.2 %
Total Protein, Urine-Ur/day: 165 mg/24 hr — ABNORMAL HIGH (ref 30–150)
Total Protein, Urine: 11 mg/dL
Total Volume: 1500

## 2020-08-09 ENCOUNTER — Telehealth: Payer: Self-pay | Admitting: *Deleted

## 2020-08-09 NOTE — Telephone Encounter (Signed)
-----  Message from Orson Slick, MD sent at 08/08/2020  8:09 AM EDT ----- Please let Mary Moyer know that her blood and urine studies showed no evidence of a protein disorder, MGUS, or multiple myeloma. We will see her back in Oct 2022 to recheck these to assure no issues, but for now there are no concerning findings in her labs.  ----- Message ----- From: Buel Ream, Lab In [place] Sent: 08/07/2020   5:36 PM EDT To: Orson Slick, MD

## 2020-08-09 NOTE — Telephone Encounter (Signed)
TCT patient regarding recent lab results. No answer but was able to leave vm message on an identified vm . Advised that her labs were normal with no evidence of a protein disorder. MGUS or Multiple Myeloma. Dr. Lorenso Courier recommends pt to come back in October 2023 to recheck labs. Advised that she can call back @ [phone] with any questions or concerns.

## 2020-08-20 ENCOUNTER — Encounter: Payer: Self-pay | Admitting: Nurse Practitioner

## 2020-08-20 ENCOUNTER — Telehealth (INDEPENDENT_AMBULATORY_CARE_PROVIDER_SITE_OTHER): Payer: BC Managed Care – PPO | Admitting: Nurse Practitioner

## 2020-08-20 VITALS — Ht 67.0 in | Wt 311.0 lb

## 2020-08-20 DIAGNOSIS — S39012A Strain of muscle, fascia and tendon of lower back, initial encounter: Secondary | ICD-10-CM

## 2020-08-20 MED ORDER — IBUPROFEN 600 MG PO TABS: 600.0000 mg | ORAL_TABLET | Freq: Two times a day (BID) | ORAL | 0 refills | Status: DC

## 2020-08-20 MED ORDER — CYCLOBENZAPRINE HCL 5 MG PO TABS: 5.0000 mg | ORAL_TABLET | Freq: Every evening | ORAL | 0 refills | Status: DC | PRN

## 2020-08-20 NOTE — Progress Notes (Signed)
Virtual Visit via Video Note  I connected withNAME@ on 08/20/20 at 11:30 AM EDT by a video enabled telemedicine application and verified that I am speaking with the correct person using two identifiers.  Location: Patient:Home Provider: Office Participants: patient and provider  I discussed the limitations of evaluation and management by telemedicine and the availability of in person appointments. I also discussed with the patient that there may be a patient responsible charge related to this service. The patient expressed understanding and agreed to proceed.  [id number] pain since saturday  History of Present Illness: Provided number to schedule appt with [facility] due to uncontrolled DM and thyroid dysfunction. Appt with urology on 09/02/2020 Back Pain This is a new problem. The current episode started in the past 7 days (onset on saturady). The pain is present in the lumbar spine and gluteal. The quality of the pain is described as aching. Radiates to: right buttocks. The pain is The same all the time. The symptoms are aggravated by twisting. Stiffness is present In the morning. Pertinent negatives include no abdominal pain, bladder incontinence, bowel incontinence, dysuria, leg pain, numbness, paresis, paresthesias, pelvic pain, perianal numbness, tingling or weakness. Risk factors include obesity, sedentary lifestyle and lack of exercise. She has tried nothing for the symptoms.  No hematuria, no rash, no fever.  Observations/Objective: Physical Exam Vitals reviewed.  Constitutional:      General: She is not in acute distress.    Appearance: She is obese.  Pulmonary:     Effort: Pulmonary effort is normal.  Musculoskeletal:        General: Normal range of motion.  Skin:    Findings: No rash.  Neurological:     Mental Status: She is alert and oriented to person, place, and time.    Assessment and Plan: Envy was seen today for acute visit.  Diagnoses and all orders  for this visit:  Strain of lumbar paraspinal muscle, initial encounter -     ibuprofen (ADVIL) 600 MG tablet; Take 1 tablet (600 mg total) by mouth every 12 (twelve) hours. With food -     cyclobenzaprine (FLEXERIL) 5 MG tablet; Take 1-2 tablets (5-10 mg total) by mouth at bedtime as needed for muscle spasms.  Follow Up Instructions: Alternate between warm and cold compress as needed Call office if no improvement in 1week and/or develop and new symptoms. Avoid prolong sitting.   I discussed the assessment and treatment plan with the patient. The patient was provided an opportunity to ask questions and all were answered. The patient agreed with the plan and demonstrated an understanding of the instructions.   The patient was advised to call back or seek an in-person evaluation if the symptoms worsen or if the condition fails to improve as anticipated.  Wilfred Lacy, NP

## 2020-08-20 NOTE — Patient Instructions (Signed)
Muscle Strain A muscle strain (pulled muscle) happens when a muscle is stretched beyond normal length. This can happen during a fall, sports, or lifting. This can tear some muscle fibers. Usually, recovery from muscle strain takes 1-2 weeks.Complete healing normally takes 5-6 weeks. This condition is first treated with PRICE therapy. This involves: Protecting your muscle from being injured again. Resting your injured muscle. Icing your injured muscle. Applying pressure (compression) to your injured muscle. This may be done with a splint or elastic bandage. Raising (elevating) your injured muscle. Your doctor may also recommend medicine for pain. Follow these instructions at home: If you have a splint: Wear the splint as told by your doctor. Take it off only as told by your doctor. Loosen the splint if your fingers or toes tingle, get numb, or turn cold and blue. Keep the splint clean. If the splint is not waterproof: Do not let it get wet. Cover it with a watertight covering when you take a bath or a shower. Managing pain, stiffness, and swelling  If told, put ice on your injured area: If you have a removable splint, take it off as told by your doctor. Put ice in a plastic bag. Place a towel between your skin and the bag. Leave the ice on for 20 minutes, 2-3 times a day. Move your fingers or toes often. This helps to avoid stiffness and lessen swelling. Raise your injured area above the level of your heart while you are sitting or lying down. Wear an elastic bandage as told by your doctor. Make sure it is not too tight.  General instructions Take over-the-counter and prescription medicines only as told by your doctor. This may include medicines for pain and swelling that are taken by mouth or put on the skin, prescription pain medicine, or muscle relaxants. Limit your activity. Rest your injured muscle as told by your doctor. Your doctor may say that gentle movements are okay. If  physical therapy was prescribed, do exercises as told by your doctor. Do not put pressure on any part of the splint until it is fully hardened. This may take many hours. Do not use any products that contain nicotine or tobacco, such as cigarettes and e-cigarettes. These can delay bone healing. If you need help quitting, ask your doctor. Warm up before you exercise. This helps to prevent more muscle strains. Ask your doctor when it is safe to drive if you have a splint. Keep all follow-up visits as told by your doctor. This is important. Contact a doctor if: You have more pain or swelling in your injured area. Get help right away if: You have any of these problems in your injured area: You have numbness. You have tingling. You lose a lot of strength. Summary A muscle strain is an injury that happens when a muscle is stretched longer than normal. This condition is first treated with PRICE therapy. This includes protecting, resting, icing, adding pressure, and raising your injury. Limit your activity. Rest your injured muscle as told by your doctor. Your doctor may say that gentle movements are okay. Warm up before you exercise. This helps to prevent more muscle strains. This information is not intended to replace advice given to you by your health care provider. Make sure you discuss any questions you have with your healthcare provider. Document Revised: 09/15/2019 Document Reviewed: 09/15/2019 Elsevier Patient Education  2022 [employer].

## 2020-09-02 DIAGNOSIS — D49511 Neoplasm of unspecified behavior of right kidney: Secondary | ICD-10-CM | POA: Diagnosis not present

## 2020-09-09 ENCOUNTER — Ambulatory Visit (HOSPITAL_COMMUNITY)
Admission: RE | Admit: 2020-09-09 | Discharge: 2020-09-09 | Disposition: A | Discharge status: Home / Self Care | Payer: BC Managed Care – PPO | Source: Ambulatory Visit | Attending: Urology | Admitting: Urology

## 2020-09-09 ENCOUNTER — Other Ambulatory Visit: Payer: Self-pay

## 2020-09-09 ENCOUNTER — Other Ambulatory Visit (HOSPITAL_COMMUNITY): Payer: Self-pay | Admitting: Urology

## 2020-09-09 DIAGNOSIS — Z01818 Encounter for other preprocedural examination: Secondary | ICD-10-CM | POA: Diagnosis not present

## 2020-09-09 DIAGNOSIS — D49511 Neoplasm of unspecified behavior of right kidney: Secondary | ICD-10-CM

## 2020-09-20 IMAGING — US US THYROID
1 series · 14 of 25 positions shown · non-contrast
Comparison: None.

CLINICAL DATA: Hypothyroid.  Elevated TSH.

EXAM:
THYROID ULTRASOUND
TECHNIQUE: Ultrasound examination of the thyroid gland and adjacent soft
tissues was performed.

[Series 1: us thyroid · 0.05mm/px · 14 of 39 slices shown]
[im 1/39]
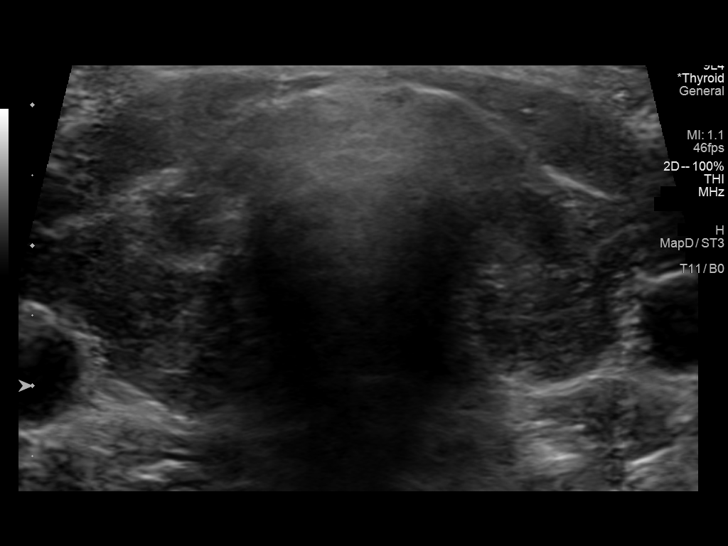
[im 4/39]
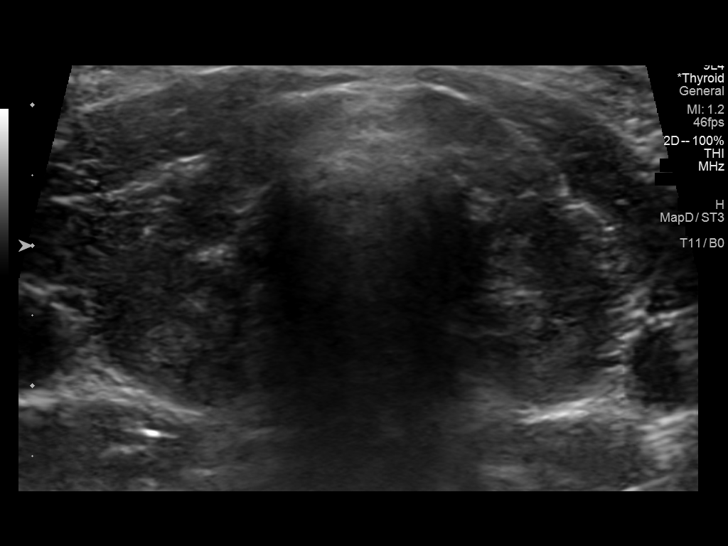
[im 7/39]
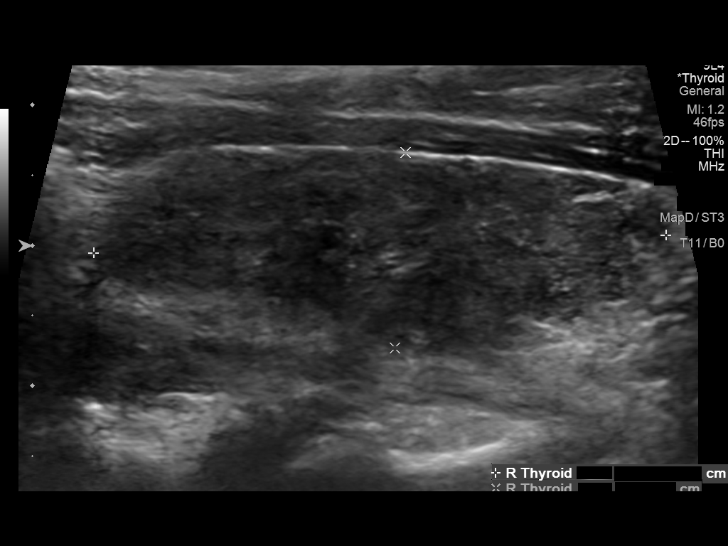
[im 10/39]
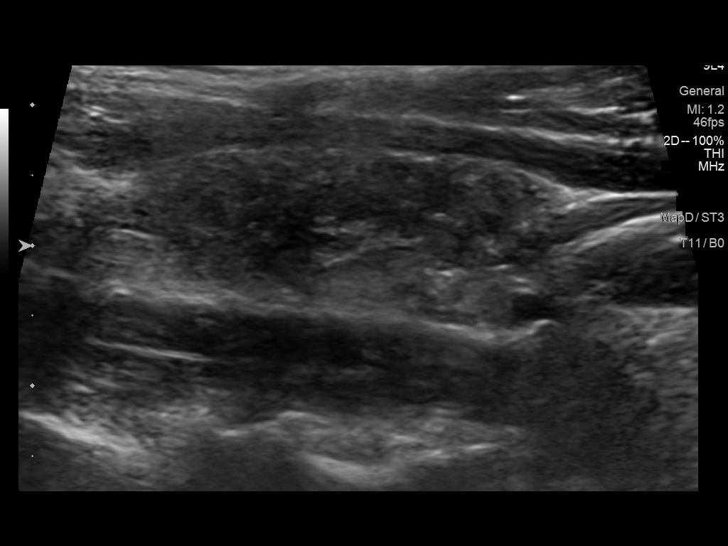
[im 13/39]
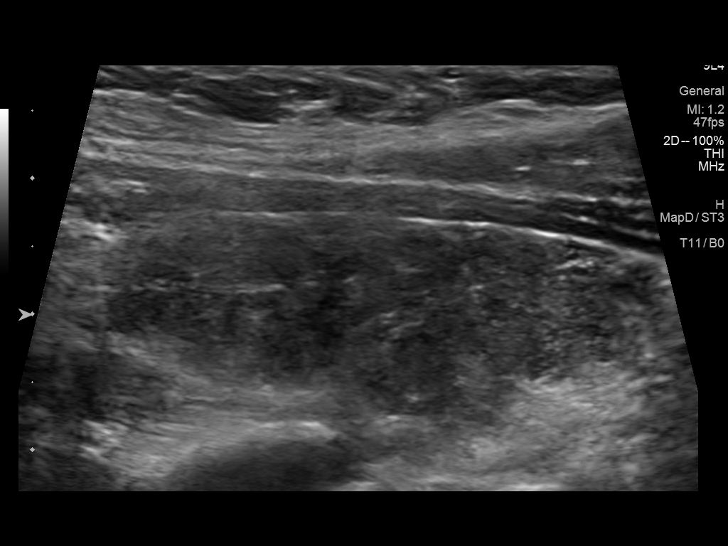
[im 15/39]
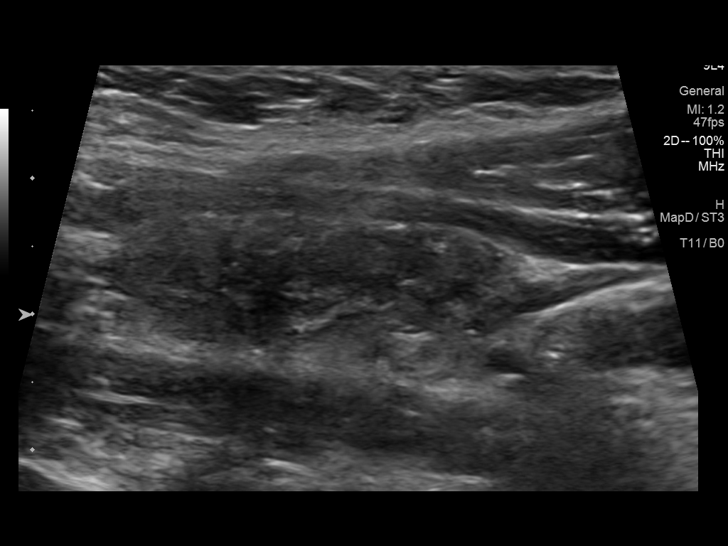
[im 18/39]
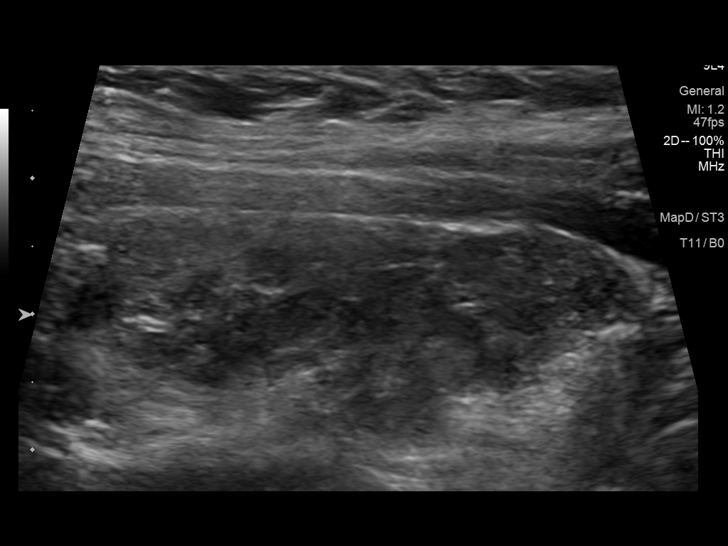
[im 21/39]
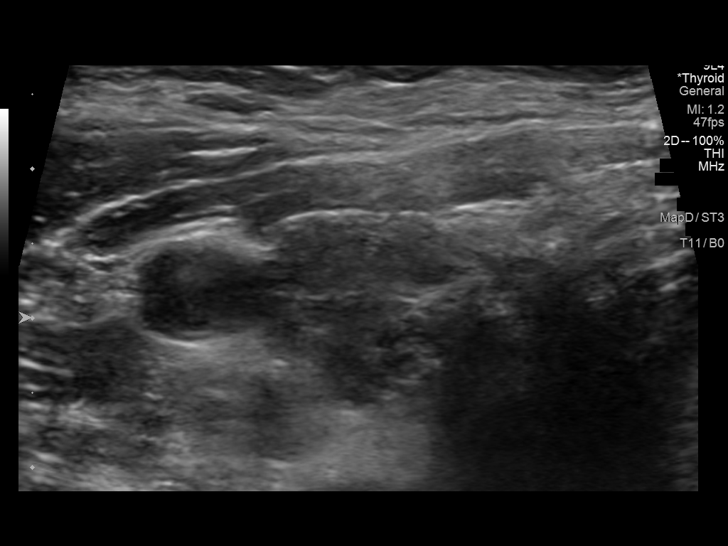
[im 24/39]
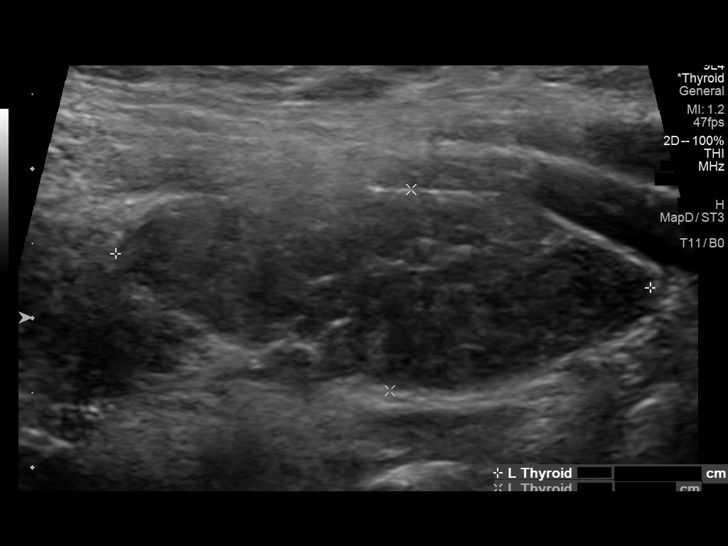
[im 26/39]
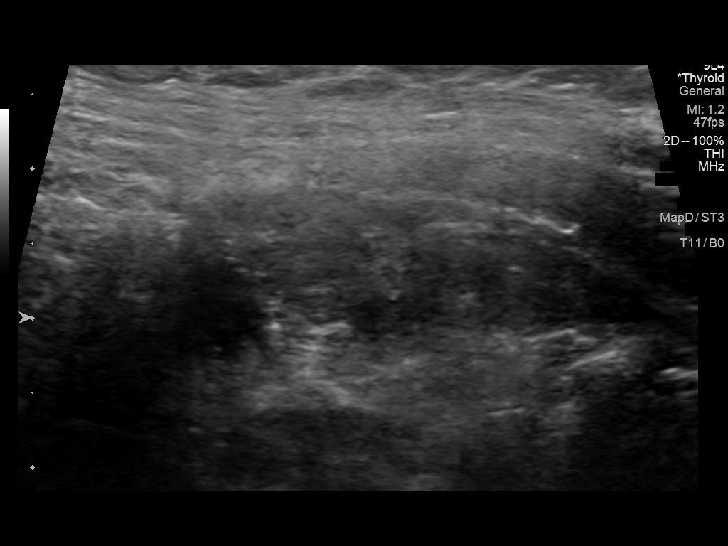
[im 29/39]
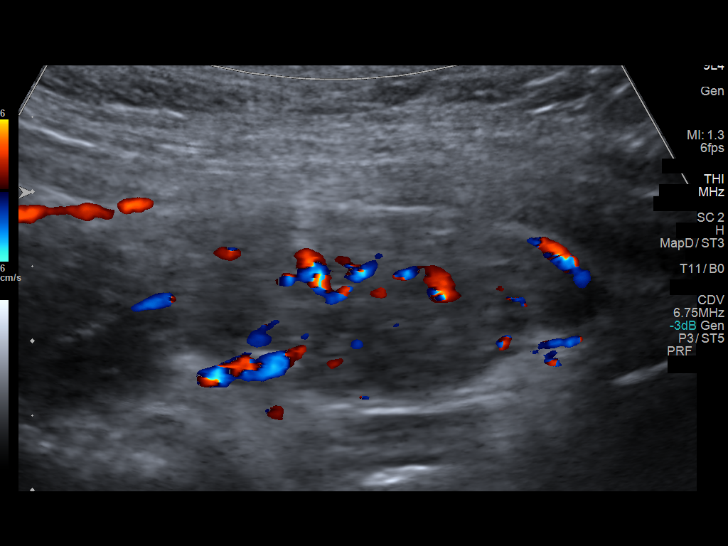
[im 32/39]
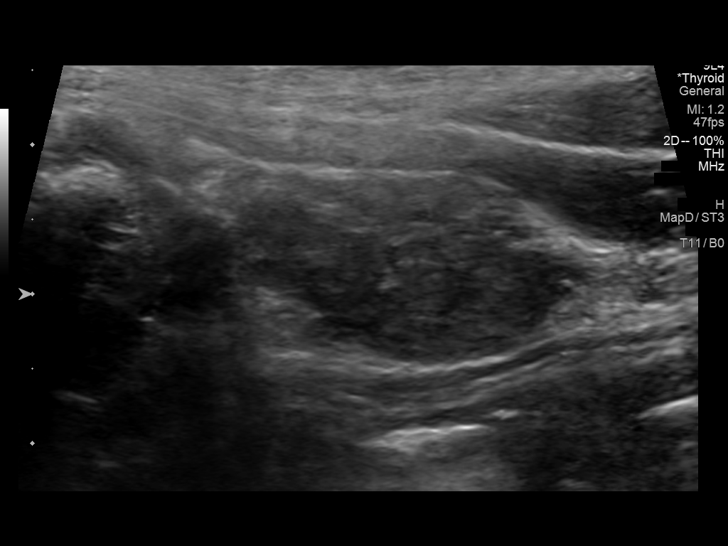
[im 35/39]
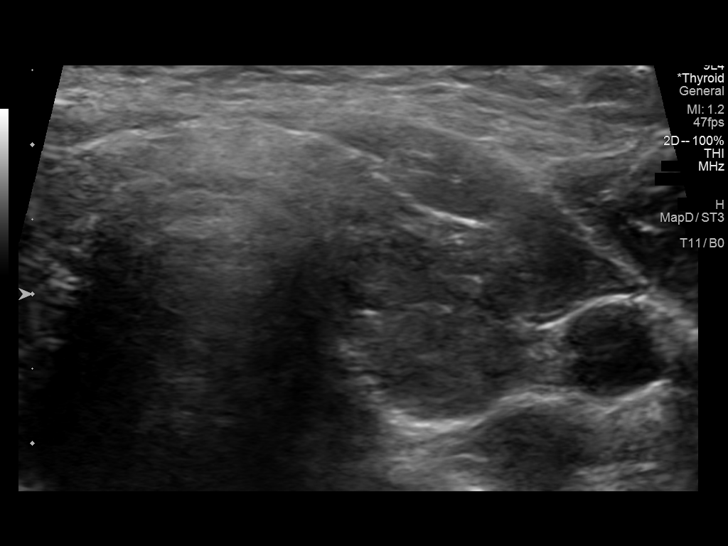
[im 39/39]
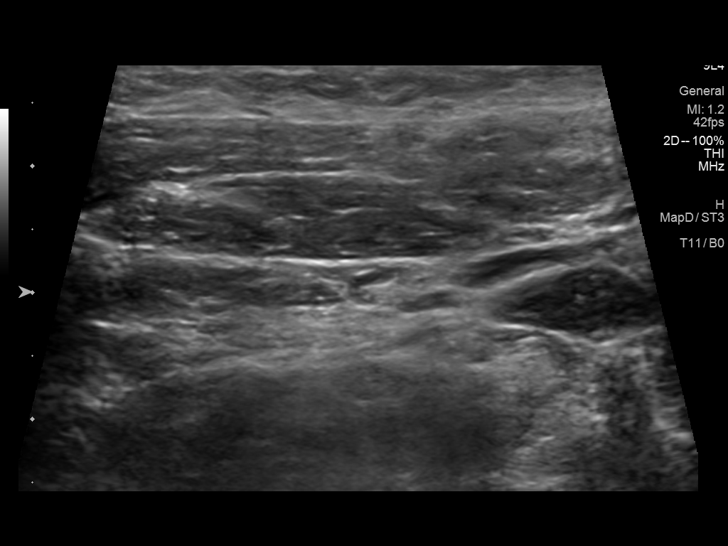

[14 of 25 positions shown; findings below may reference images not displayed]

FINDINGS: Parenchymal Echotexture: Moderately heterogenous

Isthmus: 0.4 cm

Right lobe: 4.1 x 1.4 x 1.9 cm

Left lobe: 3.8 x 1.4 x 1.4 cm

_________________________________________________________

Estimated total number of nodules >/= 1 cm: 0

Number of spongiform nodules >/=  2 cm not described below (TR1): 0

Number of mixed cystic and solid nodules >/= 1.5 cm not described
below (TR2): 0

_________________________________________________________

The thyroid parenchyma is diffusely heterogeneous bilaterally in a
pattern suggestive of thyroiditis. No discrete nodules are seen
within the thyroid gland. No abnormal lymph nodes identified.
IMPRESSION: Normal-sized and heterogeneous thyroid gland. No focal nodules
identified. Appearance of the thyroid gland is suggestive of
thyroiditis.

The above is in keeping with the ACR TI-RADS recommendations - [HOSPITAL] 0648;[DATE].

## 2020-09-24 ENCOUNTER — Other Ambulatory Visit: Payer: Self-pay | Admitting: Urology

## 2020-09-24 ENCOUNTER — Telehealth: Payer: Self-pay

## 2020-09-24 NOTE — Telephone Encounter (Signed)
-----   Message from Yevette Edwards, RN sent at 06/24/2020  2:33 PM EDT ----- Regarding: Labs Repeat hepatic function panel, order in epic.

## 2020-09-24 NOTE — Telephone Encounter (Signed)
Lab reminder sent to patient via my chart.  

## 2020-09-24 NOTE — Telephone Encounter (Signed)
Spoke with patient to remind her that she is due for repeat labs at this time. No appointment is necessary. Patient is aware that she can stop by the lab in the basement at her convenience between 7:30 AM - 5 PM, Monday through Friday. Patient verbalized understanding and had no concerns at the end of the call.   

## 2020-10-06 ENCOUNTER — Other Ambulatory Visit: Payer: Self-pay | Admitting: Nurse Practitioner

## 2020-10-06 DIAGNOSIS — E039 Hypothyroidism, unspecified: Secondary | ICD-10-CM

## 2020-10-14 ENCOUNTER — Encounter: Payer: Self-pay | Admitting: Nurse Practitioner

## 2020-10-14 DIAGNOSIS — E039 Hypothyroidism, unspecified: Secondary | ICD-10-CM

## 2020-10-15 MED ORDER — LEVOTHYROXINE SODIUM 200 MCG PO TABS: 200.0000 ug | ORAL_TABLET | Freq: Every day | ORAL | 0 refills | Status: DC

## 2020-10-23 ENCOUNTER — Other Ambulatory Visit (HOSPITAL_COMMUNITY): Payer: Self-pay

## 2020-10-24 NOTE — Patient Instructions (Addendum)
DUE TO COVID-19 ONLY ONE VISITOR IS ALLOWED TO COME WITH YOU AND STAY IN THE WAITING ROOM ONLY DURING PRE OP AND PROCEDURE.   **NO VISITORS ARE ALLOWED IN THE SHORT STAY AREA OR RECOVERY ROOM!!**  IF YOU WILL BE ADMITTED INTO THE HOSPITAL YOU ARE ALLOWED ONLY TWO SUPPORT PEOPLE DURING VISITATION HOURS ONLY (10AM -8PM)   The support person(s) may change daily. The support person(s) must pass our screening, gel in and out, and wear a mask at all times, including in the patient's room. Patients must also wear a mask when staff or their support person are in the room.  No visitors under the age of 63. Any visitor under the age of 64 must be accompanied by an adult.    COVID SWAB TESTING MUST BE COMPLETED ON:  11/06/20 **MUST PRESENT COMPLETED FORM AT TESTING SITE**    [place] (backside of the building) You are not required to quarantine, however you are required to wear a well-fitted mask when you are out and around people not in your household.  Hand Hygiene often Do NOT share personal items Notify your provider if you are in close contact with someone who has COVID or you develop fever 100.4 or greater, new onset of sneezing, cough, sore throat, shortness of breath or body aches.  [place], [address], must go inside of the hospital, NOT A DRIVE THRU!  (Must self quarantine after testing. Follow instructions on handout.)       Your procedure is scheduled on: 11/08/20   Report to [facility] Main Entrance    Report to admitting at : 10:15 AM   Call this number if you have problems the morning of surgery [phone]   May have liquids until : 9:30 AM   day of surgery  CLEAR LIQUID DIET : Starting the day before surgery.  Foods Allowed                                                                     Foods Excluded  Water, Black Coffee and tea, regular and decaf                              liquids that you cannot  Plain Jell-O in any flavor  (No red)                                           see through such as: Fruit ices (not with fruit pulp)                                     milk, soups, orange juice              Iced Popsicles (No red)  All solid food                                   Apple juices Sports drinks like Gatorade (No red) Lightly seasoned clear broth or consume(fat free) Sugar,  Sample Menu Breakfast                                Lunch                                     Supper Cranberry juice                    Beef broth                            Chicken broth Jell-O                                     Grape juice                           Apple juice Coffee or tea                        Jell-O                                      Popsicle                                                Coffee or tea                        Coffee or tea     Oral Hygiene is also important to reduce your risk of infection.                                    Remember - BRUSH YOUR TEETH THE MORNING OF SURGERY WITH YOUR REGULAR TOOTHPASTE   Do NOT smoke after Midnight   Take these medicines the morning of surgery with A SIP OF WATER: synthroid  DO NOT TAKE ANY ORAL DIABETIC MEDICATIONS DAY OF YOUR SURGERY                              You may not have any metal on your body including hair pins, jewelry, and body piercing             Do not wear make-up, lotions, powders, perfumes/cologne, or deodorant  Do not wear nail polish including gel and S&S, artificial/acrylic nails, or any other type of covering on natural nails including finger and toenails. If you have artificial nails, gel coating, etc. that needs to be removed by a nail salon please have this removed prior to surgery or surgery  may need to be canceled/ delayed if the surgeon/ anesthesia feels like they are unable to be safely monitored.   Do not shave  48 hours prior to  surgery.    Do not bring valuables to the hospital. [place].   Contacts, dentures or bridgework may not be worn into surgery.   Bring small overnight bag day of surgery.    Patients discharged on the day of surgery will not be allowed to drive home.   Special Instructions: Bring a copy of your healthcare power of attorney and living will documents         the day of surgery if you haven't scanned them before.              Please read over the following fact sheets you were given: IF YOU HAVE QUESTIONS ABOUT YOUR PRE-OP INSTRUCTIONS PLEASE CALL [phone]  [facility] - Preparing for Surgery Before surgery, you can play an important role.  Because skin is not sterile, your skin needs to be as free of germs as possible.  You can reduce the number of germs on your skin by washing with CHG (chlorahexidine gluconate) soap before surgery.  CHG is an antiseptic cleaner which kills germs and bonds with the skin to continue killing germs even after washing. Please DO NOT use if you have an allergy to CHG or antibacterial soaps.  If your skin becomes reddened/irritated stop using the CHG and inform your nurse when you arrive at Short Stay. Do not shave (including legs and underarms) for at least 48 hours prior to the first CHG shower.  You may shave your face/neck. Please follow these instructions carefully:  1.  Shower with CHG Soap the night before surgery and the  morning of Surgery.  2.  If you choose to wash your hair, wash your hair first as usual with your  normal  shampoo.  3.  After you shampoo, rinse your hair and body thoroughly to remove the  shampoo.                           4.  Use CHG as you would any other liquid soap.  You can apply chg directly  to the skin and wash                       Gently with a scrungie or clean washcloth.  5.  Apply the CHG Soap to your body ONLY FROM THE NECK DOWN.   Do not use on face/ open                            Wound or open sores. Avoid contact with eyes, ears mouth and genitals (private parts).                       Wash face,  Genitals (private parts) with your normal soap.             6.  Wash thoroughly, paying special attention to the area where your surgery  will be performed.  7.  Thoroughly rinse your body with warm water from the neck down.  8.  DO NOT shower/wash with your normal soap after using and rinsing off  the CHG Soap.  9.  Pat yourself dry with a clean towel.            10.  Wear clean pajamas.            11.  Place clean sheets on your bed the night of your first shower and do not  sleep with pets. Day of Surgery : Do not apply any lotions/deodorants the morning of surgery.  Please wear clean clothes to the hospital/surgery center.  FAILURE TO FOLLOW THESE INSTRUCTIONS MAY RESULT IN THE CANCELLATION OF YOUR SURGERY PATIENT SIGNATURE_________________________________  NURSE SIGNATURE__________________________________  ________________________________________________________________________    Adam Phenix  An incentive spirometer is a tool that can help keep your lungs clear and active. This tool measures how well you are filling your lungs with each breath. Taking long deep breaths may help reverse or decrease the chance of developing breathing (pulmonary) problems (especially infection) following: A long period of time when you are unable to move or be active. BEFORE THE PROCEDURE  If the spirometer includes an indicator to show your best effort, your nurse or respiratory therapist will set it to a desired goal. If possible, sit up straight or lean slightly forward. Try not to slouch. Hold the incentive spirometer in an upright position. INSTRUCTIONS FOR USE  Sit on the edge of your bed if possible, or sit up as far as you can in bed or on a chair. Hold the incentive spirometer in an upright position. Breathe out normally. Place the  mouthpiece in your mouth and seal your lips tightly around it. Breathe in slowly and as deeply as possible, raising the piston or the ball toward the top of the column. Hold your breath for 3-5 seconds or for as long as possible. Allow the piston or ball to fall to the bottom of the column. Remove the mouthpiece from your mouth and breathe out normally. Rest for a few seconds and repeat Steps 1 through 7 at least 10 times every 1-2 hours when you are awake. Take your time and take a few normal breaths between deep breaths. The spirometer may include an indicator to show your best effort. Use the indicator as a goal to work toward during each repetition. After each set of 10 deep breaths, practice coughing to be sure your lungs are clear. If you have an incision (the cut made at the time of surgery), support your incision when coughing by placing a pillow or rolled up towels firmly against it. Once you are able to get out of bed, walk around indoors and cough well. You may stop using the incentive spirometer when instructed by your caregiver.  RISKS AND COMPLICATIONS Take your time so you do not get dizzy or light-headed. If you are in pain, you may need to take or ask for pain medication before doing incentive spirometry. It is harder to take a deep breath if you are having pain. AFTER USE Rest and breathe slowly and easily. It can be helpful to keep track of a log of your progress. Your caregiver can provide you with a simple table to help with this. If you are using the spirometer at home, follow these instructions: [place] IF:  You are having difficultly using the spirometer. You have trouble using the spirometer as often as instructed. Your pain medication is not giving enough relief while using the spirometer. You develop fever of 100.5 F (38.1 C) or higher. SEEK IMMEDIATE MEDICAL CARE IF:  You cough up bloody sputum that had  not been present before. You develop fever of 102 F  (38.9 C) or greater. You develop worsening pain at or near the incision site. MAKE SURE YOU:  Understand these instructions. Will watch your condition. Will get help right away if you are not doing well or get worse. Document Released: 05/04/2006 Document Revised: 03/16/2011 Document Reviewed: 07/05/2006 [facility] Patient Information 2014 [place], [place].   ________________________________________________________________________

## 2020-10-25 ENCOUNTER — Other Ambulatory Visit: Payer: Self-pay

## 2020-10-25 ENCOUNTER — Encounter (HOSPITAL_COMMUNITY)
Admission: RE | Admit: 2020-10-25 | Discharge: 2020-10-25 | Disposition: A | Discharge status: Home / Self Care | Payer: BC Managed Care – PPO | Source: Ambulatory Visit | Attending: Urology | Admitting: Urology

## 2020-10-25 ENCOUNTER — Encounter (HOSPITAL_COMMUNITY): Payer: Self-pay

## 2020-10-25 VITALS — HR 83 | Temp 97.9°F | Ht 67.0 in | Wt 304.0 lb

## 2020-10-25 DIAGNOSIS — N189 Chronic kidney disease, unspecified: Secondary | ICD-10-CM | POA: Insufficient documentation

## 2020-10-25 DIAGNOSIS — E1122 Type 2 diabetes mellitus with diabetic chronic kidney disease: Secondary | ICD-10-CM | POA: Insufficient documentation

## 2020-10-25 DIAGNOSIS — Z01818 Encounter for other preprocedural examination: Secondary | ICD-10-CM | POA: Insufficient documentation

## 2020-10-25 DIAGNOSIS — N2889 Other specified disorders of kidney and ureter: Secondary | ICD-10-CM | POA: Insufficient documentation

## 2020-10-25 DIAGNOSIS — Z7984 Long term (current) use of oral hypoglycemic drugs: Secondary | ICD-10-CM | POA: Insufficient documentation

## 2020-10-25 DIAGNOSIS — E039 Hypothyroidism, unspecified: Secondary | ICD-10-CM | POA: Insufficient documentation

## 2020-10-25 DIAGNOSIS — E119 Type 2 diabetes mellitus without complications: Secondary | ICD-10-CM

## 2020-10-25 DIAGNOSIS — Z79890 Hormone replacement therapy (postmenopausal): Secondary | ICD-10-CM | POA: Insufficient documentation

## 2020-10-25 DIAGNOSIS — I129 Hypertensive chronic kidney disease with stage 1 through stage 4 chronic kidney disease, or unspecified chronic kidney disease: Secondary | ICD-10-CM | POA: Diagnosis not present

## 2020-10-25 DIAGNOSIS — Z79899 Other long term (current) drug therapy: Secondary | ICD-10-CM | POA: Diagnosis not present

## 2020-10-25 HISTORY — DX: Fatty (change of) liver, not elsewhere classified: K76.0

## 2020-10-25 HISTORY — DX: Chronic kidney disease, unspecified: N18.9

## 2020-10-25 HISTORY — DX: Hypothyroidism, unspecified: E03.9

## 2020-10-25 HISTORY — DX: Malignant (primary) neoplasm, unspecified: C80.1

## 2020-10-25 LAB — BASIC METABOLIC PANEL
Anion gap: 11 (ref 5–15)
BUN: 11 mg/dL (ref 6–20)
CO2: 24 mmol/L (ref 22–32)
Calcium: 9.8 mg/dL (ref 8.9–10.3)
Chloride: 103 mmol/L (ref 98–111)
Creatinine, Ser: 0.66 mg/dL (ref 0.44–1.00)
GFR, Estimated: >60 mL/min (ref >60)
Glucose, Bld: 233 mg/dL — ABNORMAL HIGH (ref 70–99)
Potassium: 3.9 mmol/L (ref 3.5–5.1)
Sodium: 138 mmol/L (ref 135–145)

## 2020-10-25 LAB — HEMOGLOBIN A1C
Hgb A1c MFr Bld: 11.3 % — ABNORMAL HIGH (ref 4.8–5.6)
Mean Plasma Glucose: 277.61 mg/dL

## 2020-10-25 LAB — GLUCOSE, CAPILLARY: Glucose-Capillary: 230 mg/dL — ABNORMAL HIGH (ref 70–99)

## 2020-10-25 LAB — CBC
HCT: 42.5 % (ref 36.0–46.0)
Hemoglobin: 13.9 g/dL (ref 12.0–15.0)
MCH: 29 pg (ref 26.0–34.0)
MCHC: 32.7 g/dL (ref 30.0–36.0)
MCV: 88.5 fL (ref 80.0–100.0)
Platelets: 234 10*3/uL (ref 150–400)
RBC: 4.8 MIL/uL (ref 3.87–5.11)
RDW: 13.3 % (ref 11.5–15.5)
WBC: 7.3 10*3/uL (ref 4.0–10.5)
nRBC: 0 % (ref 0.0–0.2)

## 2020-10-25 NOTE — Progress Notes (Signed)
COVID Vaccine Completed:Yes Date COVID Vaccine completed: 01/26/20 COVID vaccine manufacturer: [employer]   x 3  COVID Test: 11/06/20  PCP - Wilfred Lacy: NP Cardiologist -   Chest x-ray - 09/09/20 EKG -  Stress Test -  ECHO -  Cardiac Cath -  Pacemaker/ICD device last checked:  Sleep Study -  CPAP -   Fasting Blood Sugar - N/A Checks Blood Sugar ___0__ times a day  Blood Thinner Instructions: Aspirin Instructions: Last Dose:  Anesthesia review: DIA,HTN  Patient denies shortness of breath, fever, cough and chest pain at PAT appointment   Patient verbalized understanding of instructions that were given to them at the PAT appointment. Patient was also instructed that they will need to review over the PAT instructions again at home before surgery.

## 2020-10-28 NOTE — Progress Notes (Signed)
Hgba1c on 10/25/20 - 11.3- rotued to Dr [name] on 10/28/20.

## 2020-10-29 DIAGNOSIS — D49511 Neoplasm of unspecified behavior of right kidney: Secondary | ICD-10-CM | POA: Diagnosis not present

## 2020-10-31 ENCOUNTER — Inpatient Hospital Stay: Payer: BC Managed Care – PPO | Admitting: Hematology and Oncology

## 2020-10-31 ENCOUNTER — Encounter (HOSPITAL_COMMUNITY): Payer: Self-pay | Admitting: Physician Assistant

## 2020-10-31 ENCOUNTER — Inpatient Hospital Stay: Payer: BC Managed Care – PPO

## 2020-10-31 NOTE — Progress Notes (Signed)
Anesthesia Chart Review   Case: [id number] Date/Time: 11/08/20 1215   Procedure: XI ROBOTIC ASSISTED LAPAROSCOPIC NEPHRECTOMY (Right)   Anesthesia type: General   Pre-op diagnosis: RIGHT RENAL MASS   Location: [facility] / [facility]   Surgeons: Ceasar Mons, MD       DISCUSSION:49 y.o. never smoker with h/o HTN, hypothyroidism, CKD, DM II, right renal mass scheduled for above procedure 11/08/2020 with Dr. Harrell Gave Mary Moyer.   Uncontrolled DM II, she has been referred to endocrinologist by PCP several times, no appointment has been made.  A1C has increased from 10.7 in March to 11.3 at PAT visit.  Glucose 233 at PAT visit.  Discussed need for better diabetes control prior to surgery with Dr. Jackson Latino scheduler.  VS: Pulse 83   Temp 36.6 C (Oral)   Ht _0  (1.702 m)   Wt (!) 137.9 kg   LMP 03/28/2020   SpO2 99%   BMI 47.61 kg/m   PROVIDERS: Nche, Charlene Brooke, NP is PCP    LABS:  forwarded (all labs ordered are listed, but only abnormal results are displayed)  Labs Reviewed  HEMOGLOBIN A1C - Abnormal; Notable for the following components:      Result Value   Hgb A1c MFr Bld 11.3 (*)    All other components within normal limits  BASIC METABOLIC PANEL - Abnormal; Notable for the following components:   Glucose, Bld 233 (*)    All other components within normal limits  GLUCOSE, CAPILLARY - Abnormal; Notable for the following components:   Glucose-Capillary 230 (*)    All other components within normal limits  CBC     IMAGES:   EKG: 10/25/2020 Rate 72 bpm  Normal sinus rhythm Minimal voltage criteria for LVH, may be normal variant ( R in aVL ) Possible Anterior infarct , age undetermined Abnormal ECG No old tracing to compare  CV:  Past Medical History:  Diagnosis Date   Anxiety    Arthritis    Cancer ([place])    Chronic kidney disease    Depression    Diabetes ([place])    Fatty liver    Headache    Hypertension    Hypothyroidism    Migraines     Thyroid disease    UTI (urinary tract infection)     Past Surgical History:  Procedure Laterality Date   UTERINE FIBROID SURGERY  2003    MEDICATIONS:  blood glucose meter kit and supplies KIT   cyclobenzaprine (FLEXERIL) 5 MG tablet   glipiZIDE (GLUCOTROL XL) 10 MG 24 hr tablet   ibuprofen (ADVIL) 600 MG tablet   levothyroxine (SYNTHROID) 200 MCG tablet   lisinopril (ZESTRIL) 10 MG tablet   metFORMIN (GLUCOPHAGE-XR) 750 MG 24 hr tablet   No current facility-administered medications for this encounter.     Konrad Felix Ward, PA-C [facility]-Surgical [facility] [phone]

## 2020-11-03 ENCOUNTER — Other Ambulatory Visit: Payer: Self-pay | Admitting: Nurse Practitioner

## 2020-11-03 DIAGNOSIS — E1165 Type 2 diabetes mellitus with hyperglycemia: Secondary | ICD-10-CM

## 2020-11-04 ENCOUNTER — Other Ambulatory Visit: Payer: Self-pay | Admitting: Nurse Practitioner

## 2020-11-04 DIAGNOSIS — E1165 Type 2 diabetes mellitus with hyperglycemia: Secondary | ICD-10-CM

## 2020-11-07 ENCOUNTER — Telehealth: Payer: Self-pay

## 2020-11-07 NOTE — Telephone Encounter (Signed)
Spoke to patient regarding the refill request received for the Metformin 850 mg, 1 tab BID.   She has an upcoming appointment with Endrocrinology on 12/16/20.  They have canceled a surgery due to elevated Blood sugars due to being out of medication.  Can you please and thank you send in enough Metformin to get her to the appointment. Thanks.  Marland Kitchendmc

## 2020-11-07 NOTE — Telephone Encounter (Signed)
She reported to me that she was taking 850mg  twice daily.

## 2020-11-07 NOTE — Telephone Encounter (Signed)
Spoke to the pharmacy and they will d/c the 850 mg and get the RX refill ready for the 750 XR mg ready for her.  Patient notified [place] phone. Dm/cma

## 2020-11-08 ENCOUNTER — Inpatient Hospital Stay (HOSPITAL_COMMUNITY): Admission: RE | Admit: 2020-11-08 | Payer: BC Managed Care – PPO | Source: Ambulatory Visit | Admitting: Urology

## 2020-11-08 ENCOUNTER — Encounter (HOSPITAL_COMMUNITY): Admission: RE | Payer: Self-pay | Source: Ambulatory Visit

## 2020-11-08 SURGERY — NEPHRECTOMY, RADICAL, ROBOT-ASSISTED, LAPAROSCOPIC, ADULT
Anesthesia: General | Laterality: Right

## 2020-11-11 ENCOUNTER — Other Ambulatory Visit (HOSPITAL_COMMUNITY): Payer: Self-pay

## 2020-12-04 ENCOUNTER — Inpatient Hospital Stay: Payer: BC Managed Care – PPO

## 2020-12-04 ENCOUNTER — Inpatient Hospital Stay: Payer: BC Managed Care – PPO | Admitting: Hematology and Oncology

## 2020-12-04 NOTE — Telephone Encounter (Signed)
Pt no longer taking this dose per last note, also medications should now be handled by endocrinologist per last note also.

## 2020-12-04 NOTE — Telephone Encounter (Signed)
Duplicate

## 2020-12-16 ENCOUNTER — Other Ambulatory Visit: Payer: Self-pay

## 2020-12-16 ENCOUNTER — Ambulatory Visit: Payer: BC Managed Care – PPO | Admitting: Internal Medicine

## 2020-12-16 ENCOUNTER — Encounter: Payer: Self-pay | Admitting: Internal Medicine

## 2020-12-16 VITALS — BP 132/86 | HR 74 | Wt 305.4 lb

## 2020-12-16 DIAGNOSIS — E039 Hypothyroidism, unspecified: Secondary | ICD-10-CM | POA: Diagnosis not present

## 2020-12-16 DIAGNOSIS — E559 Vitamin D deficiency, unspecified: Secondary | ICD-10-CM

## 2020-12-16 DIAGNOSIS — E785 Hyperlipidemia, unspecified: Secondary | ICD-10-CM | POA: Diagnosis not present

## 2020-12-16 DIAGNOSIS — Z794 Long term (current) use of insulin: Secondary | ICD-10-CM

## 2020-12-16 DIAGNOSIS — E1165 Type 2 diabetes mellitus with hyperglycemia: Secondary | ICD-10-CM

## 2020-12-16 DIAGNOSIS — R739 Hyperglycemia, unspecified: Secondary | ICD-10-CM

## 2020-12-16 DIAGNOSIS — R7303 Prediabetes: Secondary | ICD-10-CM

## 2020-12-16 LAB — COMPREHENSIVE METABOLIC PANEL
ALT: 83 U/L — ABNORMAL HIGH (ref 0–35)
AST: 41 U/L — ABNORMAL HIGH (ref 0–37)
Albumin: 4.2 g/dL (ref 3.5–5.2)
Alkaline Phosphatase: 60 U/L (ref 39–117)
BUN: 11 mg/dL (ref 6–23)
CO2: 27 mEq/L (ref 19–32)
Calcium: 9.4 mg/dL (ref 8.4–10.5)
Chloride: 102 mEq/L (ref 96–112)
Creatinine, Ser: 0.58 mg/dL (ref 0.40–1.20)
GFR: 106.51 mL/min (ref >60.00)
Glucose, Bld: 158 mg/dL — ABNORMAL HIGH (ref 70–99)
Potassium: 4.2 mEq/L (ref 3.5–5.1)
Sodium: 139 mEq/L (ref 135–145)
Total Bilirubin: 0.3 mg/dL (ref 0.2–1.2)
Total Protein: 7 g/dL (ref 6.0–8.3)

## 2020-12-16 LAB — POCT GLYCOSYLATED HEMOGLOBIN (HGB A1C): Hemoglobin A1C: 8.8 % — AB (ref 4.0–5.6)

## 2020-12-16 LAB — GLUCOSE, POCT (MANUAL RESULT ENTRY): POC Glucose: 161 mg/dl — AB (ref 70–99)

## 2020-12-16 LAB — TSH: TSH: 8.54 u[IU]/mL — ABNORMAL HIGH (ref 0.35–5.50)

## 2020-12-16 LAB — LIPID PANEL
Cholesterol: 195 mg/dL (ref 0–200)
HDL: 36.1 mg/dL — ABNORMAL LOW (ref >39.00)
LDL Cholesterol: 128 mg/dL — ABNORMAL HIGH (ref 0–99)
NonHDL: 158.66
Total CHOL/HDL Ratio: 5
Triglycerides: 152 mg/dL — ABNORMAL HIGH (ref 0.0–149.0)
VLDL: 30.4 mg/dL (ref 0.0–40.0)

## 2020-12-16 LAB — MICROALBUMIN / CREATININE URINE RATIO
Creatinine,U: 112.9 mg/dL
Microalb Creat Ratio: 1 mg/g (ref 0.0–30.0)
Microalb, Ur: 1.2 mg/dL (ref 0.0–1.9)

## 2020-12-16 LAB — VITAMIN D 25 HYDROXY (VIT D DEFICIENCY, FRACTURES): VITD: 14.64 ng/mL — ABNORMAL LOW (ref 30.00–100.00)

## 2020-12-16 MED ORDER — METFORMIN HCL 850 MG PO TABS: 850.0000 mg | ORAL_TABLET | Freq: Two times a day (BID) | ORAL | 3 refills | Status: DC

## 2020-12-16 MED ORDER — GLIPIZIDE ER 10 MG PO TB24: 10.0000 mg | ORAL_TABLET | Freq: Every day | ORAL | 3 refills | Status: DC

## 2020-12-16 MED ORDER — RYBELSUS 7 MG PO TABS: 7.0000 mg | ORAL_TABLET | Freq: Every day | ORAL | 6 refills | Status: DC

## 2020-12-16 MED ORDER — ATORVASTATIN CALCIUM 10 MG PO TABS: 10.0000 mg | ORAL_TABLET | Freq: Every day | ORAL | 3 refills | Status: DC

## 2020-12-16 MED ORDER — ONETOUCH VERIO VI STRP: 1.0000 | ORAL_STRIP | Freq: Two times a day (BID) | 3 refills | Status: DC

## 2020-12-16 NOTE — Patient Instructions (Addendum)
-   Start Rybelsus 3 mg , 1 tablet Before Breakfast , after a month go to 7 mg  - Take Glipizide 10 mg XL , 1 tablet Before Supper  - Take Metformin 850 mg , 1 tablet twice a day before meals    Start Atorvastatin 10 gm at night ( Cholesterol)     HOW TO TREAT LOW BLOOD SUGARS (Blood sugar LESS THAN 70 MG/DL) Please follow the RULE OF 15 for the treatment of hypoglycemia treatment (when your (blood sugars are less than 70 mg/dL)   STEP 1: Take 15 grams of carbohydrates when your blood sugar is low, which includes:  3-4 GLUCOSE TABS  OR 3-4 OZ OF JUICE OR REGULAR SODA OR ONE TUBE OF GLUCOSE GEL    STEP 2: RECHECK blood sugar in 15 MINUTES STEP 3: If your blood sugar is still low at the 15 minute recheck --> then, go back to STEP 1 and treat AGAIN with another 15 grams of carbohydrates.

## 2020-12-16 NOTE — Progress Notes (Signed)
Name: Mary Moyer  MRN/ DOB: [id number], [birth year]   Age/ Sex: 50 y.o., female    PCP: Nche, Charlene Brooke, NP   Reason for Endocrinology Evaluation: Type 2 Diabetes Mellitus     Date of Initial Endocrinology Visit: 12/16/2020     PATIENT IDENTIFIER: Mary Moyer is a 50 y.o. female with a past medical history of T2DM, Hypothyroidism and HTN. The patient presented for initial endocrinology clinic visit on 12/16/2020 for consultative assistance with her diabetes management.    HPI: Mary Moyer was    Diagnosed with DM 2020 Prior Medications tried/Intolerance: Glipizide, Metformin  Currently checking blood sugars rarely through using her father's meter  Hypoglycemia episodes : unknown                 Hemoglobin A1c has ranged from 7.4% in 2020, peaking at 11.5% in 2021. Patient required assistance for hypoglycemia: no  Patient has required hospitalization within the last 1 year from hyper or hypoglycemia: no  In terms of diet, the patient eats 2 meals a day, snacks x2 daily . Avoids sugar sweetened beverages    She is pending nephrectomy on the right awaiting optimization of A1c      THYROID HISTORY: She has been diagnosed with hypothyroidism in 2000. Denies prior neck sx or RAI ablation    Denies local neck symptoms Denies constipation   No FH of thyroid  Father with T2DM     HOME DIABETES REGIMEN: Glipizide 10 mg XL after dinner  Metformin 750 mg XR QAM  Metformin 850 mg QPM  Levothyroxine 200 mcg daily      Statin: no ACE-I/ARB: yes Prior Diabetic Education: no   METER DOWNLOAD SUMMARY:did not bring    DIABETIC COMPLICATIONS: Microvascular complications:   Denies: CKD, retinopathy , neuropathy  Last eye exam: Completed 2021  Macrovascular complications:   Denies: CAD, PVD, CVA   PAST HISTORY: Past Medical History:  Past Medical History:  Diagnosis Date   Anxiety    Arthritis    Cancer ([place])    Chronic kidney disease     Depression    Diabetes ([place])    Fatty liver    Headache    Hypertension    Hypothyroidism    Migraines    Thyroid disease    UTI (urinary tract infection)    Past Surgical History:  Past Surgical History:  Procedure Laterality Date   UTERINE FIBROID SURGERY  2003    Social History:  reports that she has never smoked. She has never used smokeless tobacco. She reports that she does not currently use alcohol. She reports that she does not use drugs. Family History:  Family History  Problem Relation Age of Onset   Kidney disease Mother    Arthritis Mother    Hypertension Mother    Miscarriages / [place] Mother    Depression Mother    Diabetes Father    Heart attack Father    Heart disease Father    Hyperlipidemia Father    Hypertension Father    Depression Sister    Heart disease Sister    Hypertension Sister    Kidney disease Maternal Grandmother    Miscarriages / Stillbirths Maternal Grandmother    Hypertension Paternal Grandmother    Heart attack Paternal Grandmother    Cancer Paternal Grandfather    Diabetes Paternal Grandfather    Hypertension Paternal Grandfather    Asthma Daughter    Depression Daughter    Diabetes Daughter    Hypertension Daughter  Miscarriages / [place] Daughter    Colon cancer Neg Hx    Esophageal cancer Neg Hx    Pancreatic cancer Neg Hx    Stomach cancer Neg Hx      HOME MEDICATIONS: Allergies as of 12/16/2020       Reactions   Amlodipine Other (See Comments)   Headache with 32m   Orange Fruit [citrus]    migrains--headache   Latex Rash        Medication List        Accurate as of December 16, 2020  8:38 AM. If you have any questions, ask your nurse or doctor.          blood glucose meter kit and supplies Kit Dispense based on patient and insurance preference. Use once daily before breakfast. E11.65   cyclobenzaprine 5 MG tablet Commonly known as: FLEXERIL Take 1-2 tablets (5-10 mg total) by mouth at  bedtime as needed for muscle spasms.   glipiZIDE 10 MG 24 hr tablet Commonly known as: GLUCOTROL XL Take 1 tablet (10 mg total) by mouth daily. With heaviest meal   ibuprofen 600 MG tablet Commonly known as: ADVIL Take 1 tablet (600 mg total) by mouth every 12 (twelve) hours. With food What changed:  how much to take when to take this reasons to take this   levothyroxine 200 MCG tablet Commonly known as: SYNTHROID Take 1 tablet (200 mcg total) by mouth daily before breakfast. Schedule appt with endocrinology for additional refills   lisinopril 10 MG tablet Commonly known as: ZESTRIL Take 1 tablet (10 mg total) by mouth daily.   metFORMIN 750 MG 24 hr tablet Commonly known as: GLUCOPHAGE-XR Take 1 tablet (750 mg total) by mouth daily with breakfast. What changed:  how much to take when to take this         ALLERGIES: Allergies  Allergen Reactions   Amlodipine Other (See Comments)    Headache with 584m  Orange Fruit [Citrus]     migrains--headache   Latex Rash     REVIEW OF SYSTEMS: A comprehensive ROS was conducted with the patient and is negative except as per HPI and below:  Review of Systems  Constitutional:  Positive for weight loss. Negative for fever.  Gastrointestinal:  Negative for diarrhea and nausea.  Musculoskeletal:  Positive for joint pain.  Neurological:  Negative for tingling.     OBJECTIVE:   VITAL SIGNS: BP 132/86 (BP Location: Right Arm, Patient Position: Sitting, Cuff Size: Large)   Pulse 74   Wt (!) 305 lb 6.4 oz (138.5 kg)   SpO2 99%   BMI 47.83 kg/m    PHYSICAL EXAM:  General: Pt appears well and is in NAD  Neck: General: Supple without adenopathy or carotid bruits. Thyroid: Thyroid size normal.  No goiter or nodules appreciated.  Lungs: Clear with good BS bilat with no rales, rhonchi, or wheezes  Heart: RRR with normal S1 and S2 and no gallops; no murmurs; no rub  Abdomen: Normoactive bowel sounds, soft, nontender, without  masses or organomegaly palpable  Extremities:  Lower extremities - No pretibial edema. No lesions.  Skin: Normal texture and temperature to palpation. No rash noted. No Acanthosis nigricans/skin tags. No lipohypertrophy.  Neuro: MS is good with appropriate affect, pt is alert and Ox3    DATA REVIEWED:  Lab Results  Component Value Date   HGBA1C 8.8 (A) 12/16/2020   HGBA1C 11.3 (H) 10/25/2020   HGBA1C 10.7 (H) 03/19/2020    Latest  Reference Range & Units 12/16/20 09:19  COMPREHENSIVE METABOLIC PANEL  Rpt !  Sodium 135 - 145 mEq/L 139  Potassium 3.5 - 5.1 mEq/L 4.2  Chloride 96 - 112 mEq/L 102  CO2 19 - 32 mEq/L 27  Glucose 70 - 99 mg/dL 158 (H)  BUN 6 - 23 mg/dL 11  Creatinine 0.40 - 1.20 mg/dL 0.58  Calcium 8.4 - 10.5 mg/dL 9.4  Alkaline Phosphatase 39 - 117 U/L 60  Albumin 3.5 - 5.2 g/dL 4.2  AST 0 - 37 U/L 41 (H)  ALT 0 - 35 U/L 83 (H)  Total Protein 6.0 - 8.3 g/dL 7.0  Total Bilirubin 0.2 - 1.2 mg/dL 0.3  GFR >60.00 mL/min 106.51  Total CHOL/HDL Ratio  5  Cholesterol 0 - 200 mg/dL 195  HDL Cholesterol >39.00 mg/dL 36.10 (L)  LDL (calc) 0 - 99 mg/dL 128 (H)  MICROALB/CREAT RATIO 0.0 - 30.0 mg/g 1.0  NonHDL  158.66  Triglycerides 0.0 - 149.0 mg/dL 152.0 (H)  VLDL 0.0 - 40.0 mg/dL 30.4  VITD 30.00 - 100.00 ng/mL 14.64 (L)  TSH 0.35 - 5.50 uIU/mL 8.54 (H)  Creatinine,U mg/dL 112.9  Microalb, Ur 0.0 - 1.9 mg/dL 1.2    ASSESSMENT / PLAN / RECOMMENDATIONS:   1) Type 2 Diabetes Mellitus, Poorly controlled, Without complications - Most recent A1c of 8.8 %. Goal A1c < 7.0 %.    Plan: GENERAL: I have discussed with the patient the pathophysiology of diabetes. We went over the natural progression of the disease. We talked about both insulin resistance and insulin deficiency. We stressed the importance of lifestyle changes including diet and exercise. I explained the complications associated with diabetes including retinopathy, nephropathy, neuropathy as well as increased  risk of cardiovascular disease. We went over the benefit seen with glycemic control.   I explained to the patient that diabetic patients are at higher than normal risk for amputations.  She has made some progress in improving her diabetes care  I have asked her to avoid sugar-sweetened beverages and snacks if possible  Encouraged walking  Discussed add-on therapy with GLP-1 agonists, she has no hx of pancreatitis, cautioned against GI side effects . She was provided with 30 day sample of 3 mg , to be increased to 7 mg   MEDICATIONS: Start Rybelsus 3 mg , 1 tablet Before Breakfast , after a month go to 7 mg  - Take Glipizide 10 mg XL , 1 tablet Before Supper  - Take Metformin 850 mg , 1 tablet twice a day before meals   EDUCATION / INSTRUCTIONS: BG monitoring instructions: Patient is instructed to check her blood sugars 1 times a day, fasting . Call [facility] if: BG persistently < 70  I reviewed the Rule of 15 for the treatment of hypoglycemia in detail with the patient. Literature supplied.   2) Diabetic complications:  Eye: Does not have known diabetic retinopathy.  Neuro/ Feet: Does not have known diabetic peripheral neuropathy. Renal: Patient does not have known baseline CKD. She is  on an ACEI/ARB at present.   3) Hypothyroidism:   - TSH continues to be elevated but its trending down  - Will adjust as below   Medication  Levothyroxine 200 mcg , TWO on sundays and 1 tablet rest of the week   4) Dyslipidemia:    - LDL above goal. Discussed cardiovascular benefits to statins. Pt in agreement in starting statin therapy as below    Medication  Start Atorvastatin 10 mg  daily    5) Vitamin D Deficiency :   - Will start Ergocalciferol 50,000 iu weekly       Signed electronically by: Mack Guise, MD  [facility]  [facility] [address], [address] [place], [place] [address] Phone: [phone] FAX:  [phone]   CC: [address], [place] [address] Phone: [phone]  Fax: [phone]    Return to Endocrinology clinic as below: Future Appointments  Date Time Provider [place]  01/01/2021  9:45 AM [facility] None  01/01/2021 10:20 AM Orson Slick, MD [facility] None

## 2020-12-17 ENCOUNTER — Encounter: Payer: Self-pay | Admitting: Internal Medicine

## 2020-12-17 DIAGNOSIS — E785 Hyperlipidemia, unspecified: Secondary | ICD-10-CM

## 2020-12-17 DIAGNOSIS — E559 Vitamin D deficiency, unspecified: Secondary | ICD-10-CM | POA: Insufficient documentation

## 2020-12-17 HISTORY — DX: Hyperlipidemia, unspecified: E78.5

## 2020-12-17 MED ORDER — ERGOCALCIFEROL 1.25 MG (50000 UT) PO CAPS: 50000.0000 [IU] | ORAL_CAPSULE | ORAL | 1 refills | Status: DC

## 2021-01-01 ENCOUNTER — Other Ambulatory Visit: Payer: Self-pay

## 2021-01-01 ENCOUNTER — Inpatient Hospital Stay: Payer: BC Managed Care – PPO | Attending: Hematology and Oncology

## 2021-01-01 ENCOUNTER — Other Ambulatory Visit: Payer: BC Managed Care – PPO

## 2021-01-01 ENCOUNTER — Inpatient Hospital Stay (HOSPITAL_BASED_OUTPATIENT_CLINIC_OR_DEPARTMENT_OTHER): Payer: BC Managed Care – PPO | Admitting: Hematology and Oncology

## 2021-01-01 ENCOUNTER — Other Ambulatory Visit: Payer: Self-pay | Admitting: Hematology and Oncology

## 2021-01-01 ENCOUNTER — Encounter: Payer: Self-pay | Admitting: Hematology and Oncology

## 2021-01-01 VITALS — BP 161/89 | HR 71 | Temp 97.3°F | Resp 20 | Wt 308.1 lb

## 2021-01-01 DIAGNOSIS — E039 Hypothyroidism, unspecified: Secondary | ICD-10-CM | POA: Diagnosis not present

## 2021-01-01 DIAGNOSIS — E1165 Type 2 diabetes mellitus with hyperglycemia: Secondary | ICD-10-CM | POA: Insufficient documentation

## 2021-01-01 DIAGNOSIS — R768 Other specified abnormal immunological findings in serum: Secondary | ICD-10-CM | POA: Diagnosis not present

## 2021-01-01 DIAGNOSIS — Z7984 Long term (current) use of oral hypoglycemic drugs: Secondary | ICD-10-CM | POA: Insufficient documentation

## 2021-01-01 DIAGNOSIS — D472 Monoclonal gammopathy: Secondary | ICD-10-CM

## 2021-01-01 DIAGNOSIS — K760 Fatty (change of) liver, not elsewhere classified: Secondary | ICD-10-CM | POA: Diagnosis not present

## 2021-01-01 DIAGNOSIS — N2889 Other specified disorders of kidney and ureter: Secondary | ICD-10-CM | POA: Insufficient documentation

## 2021-01-01 DIAGNOSIS — Z905 Acquired absence of kidney: Secondary | ICD-10-CM | POA: Insufficient documentation

## 2021-01-01 DIAGNOSIS — K7581 Nonalcoholic steatohepatitis (NASH): Secondary | ICD-10-CM

## 2021-01-01 DIAGNOSIS — E1122 Type 2 diabetes mellitus with diabetic chronic kidney disease: Secondary | ICD-10-CM | POA: Diagnosis not present

## 2021-01-01 DIAGNOSIS — R7989 Other specified abnormal findings of blood chemistry: Secondary | ICD-10-CM

## 2021-01-01 DIAGNOSIS — I129 Hypertensive chronic kidney disease with stage 1 through stage 4 chronic kidney disease, or unspecified chronic kidney disease: Secondary | ICD-10-CM | POA: Diagnosis not present

## 2021-01-01 DIAGNOSIS — R7303 Prediabetes: Secondary | ICD-10-CM

## 2021-01-01 LAB — CBC WITH DIFFERENTIAL (CANCER CENTER ONLY)
Abs Immature Granulocytes: 0.04 10*3/uL (ref 0.00–0.07)
Basophils Absolute: 0 10*3/uL (ref 0.0–0.1)
Basophils Relative: 0 %
Eosinophils Absolute: 0.1 10*3/uL (ref 0.0–0.5)
Eosinophils Relative: 1 %
HCT: 34.7 % — ABNORMAL LOW (ref 36.0–46.0)
Hemoglobin: 11.4 g/dL — ABNORMAL LOW (ref 12.0–15.0)
Immature Granulocytes: 1 %
Lymphocytes Relative: 25 %
Lymphs Abs: 1.9 10*3/uL (ref 0.7–4.0)
MCH: 28.9 pg (ref 26.0–34.0)
MCHC: 32.9 g/dL (ref 30.0–36.0)
MCV: 87.8 fL (ref 80.0–100.0)
Monocytes Absolute: 0.4 10*3/uL (ref 0.1–1.0)
Monocytes Relative: 5 %
Neutro Abs: 5.3 10*3/uL (ref 1.7–7.7)
Neutrophils Relative %: 68 %
Platelet Count: 240 10*3/uL (ref 150–400)
RBC: 3.95 MIL/uL (ref 3.87–5.11)
RDW: 13.4 % (ref 11.5–15.5)
WBC Count: 7.8 10*3/uL (ref 4.0–10.5)
nRBC: 0 % (ref 0.0–0.2)

## 2021-01-01 LAB — CMP (CANCER CENTER ONLY)
ALT: 73 U/L — ABNORMAL HIGH (ref 0–44)
AST: 36 U/L (ref 15–41)
Albumin: 4 g/dL (ref 3.5–5.0)
Alkaline Phosphatase: 56 U/L (ref 38–126)
Anion gap: 10 (ref 5–15)
BUN: 11 mg/dL (ref 6–20)
CO2: 24 mmol/L (ref 22–32)
Calcium: 9.4 mg/dL (ref 8.9–10.3)
Chloride: 104 mmol/L (ref 98–111)
Creatinine: 0.68 mg/dL (ref 0.44–1.00)
GFR, Estimated: >60 mL/min (ref >60)
Glucose, Bld: 167 mg/dL — ABNORMAL HIGH (ref 70–99)
Potassium: 4 mmol/L (ref 3.5–5.1)
Sodium: 138 mmol/L (ref 135–145)
Total Bilirubin: 0.3 mg/dL (ref 0.3–1.2)
Total Protein: 7.2 g/dL (ref 6.5–8.1)

## 2021-01-01 LAB — HEPATIC FUNCTION PANEL
ALT: 70 U/L — ABNORMAL HIGH (ref 0–35)
AST: 36 U/L (ref 0–37)
Albumin: 4 g/dL (ref 3.5–5.2)
Alkaline Phosphatase: 58 U/L (ref 39–117)
Bilirubin, Direct: 0 mg/dL (ref 0.0–0.3)
Total Bilirubin: 0.3 mg/dL (ref 0.2–1.2)
Total Protein: 7.1 g/dL (ref 6.0–8.3)

## 2021-01-01 NOTE — Progress Notes (Signed)
[place] [phone]   [phone]  PROGRESS NOTE  Patient Care Team: Nche, Charlene Brooke, NP as PCP - General (Internal Medicine)  Hematological/Oncological History # Elevated IgA 06/18/2020: During GI evaluation patient was found to have IgA 371 (nml 47-310) 07/31/2020: establish care with Dr. Lorenso Courier   Interval History:  Mary Moyer 50 y.o. female with medical history significant for elevated IgA and a renal mass who presents for a follow up visit. The patient's last visit was on 07/31/2020. In the interim since the last visit her nephrectomy has been postponed due to hyperglycemia.  On exam today Mary Moyer notes that she has been having some difficulty with pain that radiates from her buttocks down her left leg but that she is not having any true flank pain or bright red blood in her urine.  She was supposed undergo nephrectomy in November 2022 but this was delayed due to hyperglycemia.  She reports that she had A1c of 11 with sugars in the high 200s.  She is currently working with endocrinology to get this under better control.  It was told to her that she needs to have a hemoglobin A1c less than 6 in order to undergo the surgery.  She reports that she continues to manage her sugars as best as she can.  They will be reevaluating her in February 2023 for possible surgery in March 2023.  Otherwise she denies any fevers, chills, sweats, nausea, vomiting or diarrhea.  A full 10 point ROS is listed below.  MEDICAL HISTORY:  Past Medical History:  Diagnosis Date   Anxiety    Arthritis    Cancer ([place])    Chronic kidney disease    Depression    Diabetes ([place])    Fatty liver    Headache    Hypertension    Hypothyroidism    Migraines    Thyroid disease    UTI (urinary tract infection)     SURGICAL HISTORY: Past Surgical History:  Procedure Laterality Date   UTERINE FIBROID SURGERY  2003    SOCIAL HISTORY: Social History   Socioeconomic  History   Marital status: Divorced    Spouse name: Not on file   Number of children: 0   Years of education: Not on file   Highest education level: Not on file  Occupational History   Not on file  Tobacco Use   Smoking status: Never   Smokeless tobacco: Never  Vaping Use   Vaping Use: Never used  Substance and Sexual Activity   Alcohol use: Not Currently   Drug use: Never   Sexual activity: Yes    Birth control/protection: None  Other Topics Concern   Not on file  Social History Narrative   Not on file   Social Determinants of Health   Financial Resource Strain: Not on file  Food Insecurity: Not on file  Transportation Needs: Not on file  Physical Activity: Not on file  Stress: Not on file  Social Connections: Not on file  Intimate Partner Violence: Not on file    FAMILY HISTORY: Family History  Problem Relation Age of Onset   Kidney disease Mother    Arthritis Mother    Hypertension Mother    Miscarriages / [place] Mother    Depression Mother    Diabetes Father    Heart attack Father    Heart disease Father    Hyperlipidemia Father    Hypertension Father    Depression Sister    Heart  disease Sister    Hypertension Sister    Kidney disease Maternal Grandmother    Miscarriages / Stillbirths Maternal Grandmother    Hypertension Paternal Grandmother    Heart attack Paternal Grandmother    Cancer Paternal Grandfather    Diabetes Paternal Grandfather    Hypertension Paternal Grandfather    Asthma Daughter    Depression Daughter    Diabetes Daughter    Hypertension Daughter    Miscarriages / [place] Daughter    Colon cancer Neg Hx    Esophageal cancer Neg Hx    Pancreatic cancer Neg Hx    Stomach cancer Neg Hx     ALLERGIES:  is allergic to amlodipine, orange fruit [citrus], and latex.  MEDICATIONS:  Current Outpatient Medications  Medication Sig Dispense Refill   atorvastatin (LIPITOR) 10 MG tablet Take 1 tablet (10 mg total) by mouth daily.  90 tablet 3   blood glucose meter kit and supplies KIT Dispense based on patient and insurance preference. Use once daily before breakfast. E11.65 (Patient not taking: Reported on 12/16/2020) 1 each 0   cyclobenzaprine (FLEXERIL) 5 MG tablet Take 1-2 tablets (5-10 mg total) by mouth at bedtime as needed for muscle spasms. 7 tablet 0   ergocalciferol (VITAMIN D2) 1.25 MG (50000 UT) capsule Take 1 capsule (50,000 Units total) by mouth once a week. 13 capsule 1   glipiZIDE (GLUCOTROL XL) 10 MG 24 hr tablet Take 1 tablet (10 mg total) by mouth daily before supper. With heaviest meal 90 tablet 3   glucose blood (ONETOUCH VERIO) test strip 1 each by Other route in the morning and at bedtime. Use as instructed 200 each 3   ibuprofen (ADVIL) 600 MG tablet Take 1 tablet (600 mg total) by mouth every 12 (twelve) hours. With food (Patient taking differently: Take 800 mg by mouth daily as needed for mild pain or moderate pain. With food) 14 tablet 0   levothyroxine (SYNTHROID) 200 MCG tablet Take 1 tablet (200 mcg total) by mouth daily before breakfast. Schedule appt with endocrinology for additional refills 90 tablet 0   lisinopril (ZESTRIL) 10 MG tablet Take 1 tablet (10 mg total) by mouth daily. 90 tablet 3   metFORMIN (GLUCOPHAGE) 850 MG tablet Take 1 tablet (850 mg total) by mouth 2 (two) times daily with a meal. 180 tablet 3   Semaglutide (RYBELSUS) 7 MG TABS Take 7 mg by mouth daily. 30 tablet 6   No current facility-administered medications for this visit.    REVIEW OF SYSTEMS:   Constitutional: ( - ) fevers, ( - )  chills , ( - ) night sweats Eyes: ( - ) blurriness of vision, ( - ) double vision, ( - ) watery eyes Ears, nose, mouth, throat, and face: ( - ) mucositis, ( - ) sore throat Respiratory: ( - ) cough, ( - ) dyspnea, ( - ) wheezes Cardiovascular: ( - ) palpitation, ( - ) chest discomfort, ( - ) lower extremity swelling Gastrointestinal:  ( - ) nausea, ( - ) heartburn, ( - ) change in bowel  habits Skin: ( - ) abnormal skin rashes Lymphatics: ( - ) new lymphadenopathy, ( - ) easy bruising Neurological: ( - ) numbness, ( - ) tingling, ( - ) new weaknesses Behavioral/Psych: ( - ) mood change, ( - ) new changes  All other systems were reviewed with the patient and are negative.  PHYSICAL EXAMINATION: Vitals:   01/01/21 1010  BP: (!) 161/89  Pulse: 71  Resp: 20  Temp: (!) 97.3 F (36.3 C)  SpO2: 99%   Filed Weights   01/01/21 1010  Weight: (!) 308 lb 1.6 oz (139.8 kg)    GENERAL: Well-appearing obese middle-aged Caucasian female alert, no distress and comfortable SKIN: skin color, texture, turgor are normal, no rashes or significant lesions EYES: conjunctiva are pink and non-injected, sclera clear LUNGS: clear to auscultation and percussion with normal breathing effort HEART: regular rate & rhythm and no murmurs and no lower extremity edema Musculoskeletal: no cyanosis of digits and no clubbing  PSYCH: alert & oriented x 3, fluent speech NEURO: no focal motor/sensory deficits  LABORATORY DATA:  I have reviewed the data as listed CBC Latest Ref Rng & Units 01/01/2021 10/25/2020 07/31/2020  WBC 4.0 - 10.5 K/uL 7.8 7.3 7.6  Hemoglobin 12.0 - 15.0 g/dL 11.4(L) 13.9 13.1  Hematocrit 36.0 - 46.0 % 34.7(L) 42.5 39.9  Platelets 150 - 400 K/uL 240 234 229    CMP Latest Ref Rng & Units 01/01/2021 01/01/2021 12/16/2020  Glucose 70 - 99 mg/dL 167(H) - 158(H)  BUN 6 - 20 mg/dL 11 - 11  Creatinine 0.44 - 1.00 mg/dL 0.68 - 0.58  Sodium 135 - 145 mmol/L 138 - 139  Potassium 3.5 - 5.1 mmol/L 4.0 - 4.2  Chloride 98 - 111 mmol/L 104 - 102  CO2 22 - 32 mmol/L 24 - 27  Calcium 8.9 - 10.3 mg/dL 9.4 - 9.4  Total Protein 6.5 - 8.1 g/dL 7.2 7.1 7.0  Total Bilirubin 0.3 - 1.2 mg/dL 0.3 0.3 0.3  Alkaline Phos 38 - 126 U/L 56 58 60  AST 15 - 41 U/L 36 36 41(H)  ALT 0 - 44 U/L 73(H) 70(H) 83(H)    Lab Results  Component Value Date   MPROTEIN Not Observed 07/31/2020   Lab  Results  Component Value Date   KPAFRELGTCHN 21.0 (H) 07/31/2020   LAMBDASER 18.0 07/31/2020   KAPLAMBRATIO 7.88 08/05/2020   KAPLAMBRATIO 1.17 07/31/2020   RADIOGRAPHIC STUDIES: No results found.  ASSESSMENT & PLAN  Mary Moyer 50 y.o. female with medical history significant for elevated IgA and a renal mass who presents for a follow up visit.  #IgA Elevation -- mild elevation, unlikely to be of clinical significance --will repeat SPEP and SFLC. Prior testing showed no monoclonal component.  --RTC PRN if there are no concerning findings on today's SPEP and serum free light chain work-up   # Renal Mass Concerning for RCC --patient has referral to [facility], he was scheduled for surgery but this was delayed due to hyperglycemia --Patient is working with endocrinology to get her sugars under better control to undergo this procedure --we can take care of follow up management of this patient if patient undergoes nephrectomy and is found to have RCC --RTC PRN  No orders of the defined types were placed in this encounter.   All questions were answered. The patient knows to call the clinic with any problems, questions or concerns.  A total of more than 30 minutes were spent on this encounter with face-to-face time and non-face-to-face time, including preparing to see the patient, ordering tests and/or medications, counseling the patient and coordination of care as outlined above.   Ledell Peoples, MD Department of Hematology/Oncology [place] [facility] Phone: [phone] Pager: [phone] Email: [email or web address]  01/01/2021 1:36 PM

## 2021-01-02 LAB — KAPPA/LAMBDA LIGHT CHAINS
Kappa free light chain: 32.9 mg/L — ABNORMAL HIGH (ref 3.3–19.4)
Kappa, lambda light chain ratio: 1.49 (ref 0.26–1.65)
Lambda free light chains: 22.1 mg/L (ref 5.7–26.3)

## 2021-01-07 LAB — MULTIPLE MYELOMA PANEL, SERUM
Albumin SerPl Elph-Mcnc: 3.6 g/dL (ref 2.9–4.4)
Albumin/Glob SerPl: 1.1 (ref 0.7–1.7)
Alpha 1: 0.3 g/dL (ref 0.0–0.4)
Alpha2 Glob SerPl Elph-Mcnc: 0.8 g/dL (ref 0.4–1.0)
B-Globulin SerPl Elph-Mcnc: 1.1 g/dL (ref 0.7–1.3)
Gamma Glob SerPl Elph-Mcnc: 1 g/dL (ref 0.4–1.8)
Globulin, Total: 3.3 g/dL (ref 2.2–3.9)
IgA: 287 mg/dL (ref 87–352)
IgG (Immunoglobin G), Serum: 904 mg/dL (ref 586–1602)
IgM (Immunoglobulin M), Srm: 100 mg/dL (ref 26–217)
Total Protein ELP: 6.9 g/dL (ref 6.0–8.5)

## 2021-01-20 DIAGNOSIS — F331 Major depressive disorder, recurrent, moderate: Secondary | ICD-10-CM | POA: Diagnosis not present

## 2021-01-23 ENCOUNTER — Other Ambulatory Visit: Payer: Self-pay | Admitting: Nurse Practitioner

## 2021-01-23 DIAGNOSIS — E039 Hypothyroidism, unspecified: Secondary | ICD-10-CM

## 2021-01-27 ENCOUNTER — Telehealth: Payer: Self-pay | Admitting: Internal Medicine

## 2021-01-27 ENCOUNTER — Other Ambulatory Visit: Payer: Self-pay | Admitting: Nurse Practitioner

## 2021-01-27 DIAGNOSIS — E039 Hypothyroidism, unspecified: Secondary | ICD-10-CM

## 2021-01-27 NOTE — Telephone Encounter (Signed)
MEDICATION: levothyroxine (SYNTHROID) 200 MCG tablet  PHARMACY:   [id number] - [address], [place] Phone:  [phone]  Fax:  [phone]      HAS THE PATIENT CONTACTED THEIR PHARMACY?  YES  IS THIS A 90 DAY SUPPLY : 30 DAYS  IS PATIENT OUT OF MEDICATION: 1 PILL LEFT  IF NOT; HOW MUCH IS LEFT:   LAST APPOINTMENT DATE: @12 /12/2020  NEXT APPOINTMENT DATE:@3 /02/2021  DO WE HAVE YOUR PERMISSION TO LEAVE A DETAILED MESSAGE?:  OTHER COMMENTS:    **Let patient know to contact pharmacy at the end of the day to make sure medication is ready. **  ** Please notify patient to allow 48-72 hours to process**  **Encourage patient to contact the pharmacy for refills or they can request refills through [facility]**

## 2021-01-27 NOTE — Telephone Encounter (Signed)
Per last note on refills, pt needs to contact endocrinologist for additional refills and management of medications.   LVM for patient to return call to remind her of this.

## 2021-01-28 MED ORDER — LEVOTHYROXINE SODIUM 200 MCG PO TABS: 200.0000 ug | ORAL_TABLET | Freq: Every day | ORAL | 0 refills | Status: DC

## 2021-01-28 NOTE — Telephone Encounter (Signed)
Script sent  

## 2021-01-29 DIAGNOSIS — F331 Major depressive disorder, recurrent, moderate: Secondary | ICD-10-CM | POA: Diagnosis not present

## 2021-01-31 ENCOUNTER — Other Ambulatory Visit: Payer: Self-pay | Admitting: Urology

## 2021-02-10 DIAGNOSIS — F331 Major depressive disorder, recurrent, moderate: Secondary | ICD-10-CM | POA: Diagnosis not present

## 2021-02-24 ENCOUNTER — Encounter: Payer: Self-pay | Admitting: Family

## 2021-02-24 ENCOUNTER — Ambulatory Visit: Payer: BC Managed Care – PPO | Admitting: Family

## 2021-02-24 ENCOUNTER — Other Ambulatory Visit: Payer: Self-pay

## 2021-02-24 VITALS — BP 139/87 | HR 87 | Temp 97.9°F | Ht 67.0 in | Wt 304.0 lb

## 2021-02-24 DIAGNOSIS — I10 Essential (primary) hypertension: Secondary | ICD-10-CM | POA: Diagnosis not present

## 2021-02-24 DIAGNOSIS — Z1211 Encounter for screening for malignant neoplasm of colon: Secondary | ICD-10-CM | POA: Diagnosis not present

## 2021-02-24 DIAGNOSIS — E785 Hyperlipidemia, unspecified: Secondary | ICD-10-CM

## 2021-02-24 DIAGNOSIS — E1169 Type 2 diabetes mellitus with other specified complication: Secondary | ICD-10-CM

## 2021-02-24 DIAGNOSIS — E039 Hypothyroidism, unspecified: Secondary | ICD-10-CM | POA: Diagnosis not present

## 2021-02-24 DIAGNOSIS — E1165 Type 2 diabetes mellitus with hyperglycemia: Secondary | ICD-10-CM

## 2021-02-24 DIAGNOSIS — G4701 Insomnia due to medical condition: Secondary | ICD-10-CM

## 2021-02-24 DIAGNOSIS — F411 Generalized anxiety disorder: Secondary | ICD-10-CM

## 2021-02-24 NOTE — Assessment & Plan Note (Addendum)
Chronic - pt seeing endocrinologist now, unsure if she will continue d/t cost, labs to be done next month

## 2021-02-24 NOTE — Progress Notes (Signed)
Subjective:     Patient ID: Mary Moyer, female    DOB: 05-28-1970, 51 y.o.   MRN: [id number]  Chief Complaint  Patient presents with   Establish Care   Diabetes   Hypertension   Hypothyroidism   Hyperlipidemia   Anxiety   Insomnia    HPI: Patient presents today for follow up of multiple medical problems. Hypothyroidism: Patient presents today for followup of Hypothyroidism.  Patient reports positive compliance with daily medication.  Patient denies any of the following symptoms: fatigue, cold intolerance, constipation, weight gain or inability to lose weight, muscle weakness, mental slowing, dry hair and skin. Last TSH and free T4: Lab Results  Component Value Date   FREE T4 0.50 (L) 03/19/2020   FREE T4 0.58 (L) 11/06/2019   FREE T4 1.09 07/28/2019   TSH 8.54 (H) 12/16/2020   TSH 48.03 (H) 03/19/2020   TSH 30.67 (H) 11/06/2019   Anxiety/Depression: Patient complains of anxiety disorder.   She has the following symptoms: insomnia, irritable, palpitations, racing thoughts, sweating.  Onset of symptoms was approximately  months ago, She denies current suicidal and homicidal ideation.  Possible organic causes contributing are: none.  Risk factors: negative life event health issues and new cancer DX.   Previous treatment includes Prozac and Wellbutrin and individual therapy.  She complains of the following side effects from the treatment:  ineffectiveness with meds, doing well in therapy . INSOMNIA:  How long: years. Difficulty initiating sleep: yes.  Difficulty maintaining sleep: yes. OTC meds tried: no. RX meds in past: Trazodone. Sleep hygiene measures: yes. Started any new meds recently: yes. Shift worker: no. New stressors: yes- new cancer DX.  Diabetes Type 2 Pt is currently maintained on the following medications for diabetes: Metformin, Glipizide, Rybelsus Last diabetic eye exam was  Lab Results  Component Value Date   HMDIABEYEEXA No Retinopathy 06/09/2019  Denies  polyuria/polydipsia. Denies hypoglycemia. Home glucose readings range 111-170, fasting Lab Results  Component Value Date   HGBA1C 8.8 (A) 12/16/2020   HGBA1C 11.3 (H) 10/25/2020   HGBA1C 10.7 (H) 03/19/2020    Lab Results  Component Value Date   MICROALBUR 1.2 12/16/2020   LDLCALC 128 (H) 12/16/2020   CREATININE 0.68 01/01/2021  Hyperlipidemia Patient is currently maintained on the following medication for hyperlipidemia: Lipitor Patient denies myalgia. Patient reports good compliance with low fat/low cholesterol diet.  Last lipid panel as follows:  Lab Results  Component Value Date   CHOL 195 12/16/2020   HDL 36.10 (L) 12/16/2020   LDLCALC 128 (H) 12/16/2020   LDLDIRECT 160.0 03/22/2018   TRIG 152.0 (H) 12/16/2020   CHOLHDL 5 12/16/2020  Hypertension Patient is currently maintained on the following medications for blood pressure: Lisinopril Patient reports good compliance with blood pressure medications. Patient denies chest pain, shortness of breath, headache, dizziness, or swelling. Last 3 blood pressure readings in our office are as follows: BP Readings from Last 3 Encounters:  02/24/21 139/87  01/01/21 (!) 161/89  12/16/20 132/86    Health Maintenance Due  Topic Date Due   FOOT EXAM  Never done   PAP SMEAR-Modifier  Never done   COLONOSCOPY (Pts 45-69yr Insurance coverage will need to be confirmed)  Never done   OPHTHALMOLOGY EXAM  06/08/2020    Past Medical History:  Diagnosis Date   Anxiety    [place]thritis    [place]    Chronic kidney disease    Depression    Diabetes ([place]    Dyslipidemia 12/17/2020  Fatty liver    Headache    Hyperlipidemia 03/22/2018   Hypertension    Hypothyroidism    Hypothyroidism 03/22/2018   Migraines    Thyroid disease    UTI (urinary tract infection)     Past Surgical History:  Procedure Laterality Date   UTERINE FIBROID SURGERY  2003    Outpatient Medications Prior to Visit  Medication Sig Dispense Refill    atorvastatin (LIPITOR) 10 MG tablet Take 1 tablet (10 mg total) by mouth daily. 90 tablet 3   blood glucose meter kit and supplies KIT Dispense based on patient and insurance preference. Use once daily before breakfast. E11.65 1 each 0   glipiZIDE (GLUCOTROL XL) 10 MG 24 hr tablet Take 1 tablet (10 mg total) by mouth daily before supper. With heaviest meal 90 tablet 3   glucose blood (ONETOUCH VERIO) test strip 1 each by Other route in the morning and at bedtime. Use as instructed 200 each 3   levothyroxine (SYNTHROID) 200 MCG tablet Take 1 tablet (200 mcg total) by mouth daily before breakfast. Schedule appt with endocrinology for additional refills 90 tablet 0   lisinopril (ZESTRIL) 10 MG tablet Take 1 tablet (10 mg total) by mouth daily. 90 tablet 3   metFORMIN (GLUCOPHAGE) 850 MG tablet Take 1 tablet (850 mg total) by mouth 2 (two) times daily with a meal. 180 tablet 3   Semaglutide (RYBELSUS) 7 MG TABS Take 7 mg by mouth daily. 30 tablet 6   cyclobenzaprine (FLEXERIL) 5 MG tablet Take 1-2 tablets (5-10 mg total) by mouth at bedtime as needed for muscle spasms. (Patient not taking: Reported on 02/24/2021) 7 tablet 0   ergocalciferol (VITAMIN D2) 1.25 MG (50000 UT) capsule Take 1 capsule (50,000 Units total) by mouth once a week. (Patient not taking: Reported on 02/24/2021) 13 capsule 1   ibuprofen (ADVIL) 600 MG tablet Take 1 tablet (600 mg total) by mouth every 12 (twelve) hours. With food (Patient not taking: Reported on 02/24/2021) 14 tablet 0   No facility-administered medications prior to visit.    Allergies  Allergen Reactions   Amlodipine Other (See Comments)    Headache with 65m   Orange Fruit [Citrus]     migrains--headache   Latex Rash      Objective:    Physical Exam Vitals and nursing note reviewed.  Constitutional:      Appearance: Normal appearance. She is morbidly obese.  Cardiovascular:     Rate and Rhythm: Normal rate and regular rhythm.  Pulmonary:     Effort:  Pulmonary effort is normal.     Breath sounds: Normal breath sounds.  Musculoskeletal:        General: Normal range of motion.  Skin:    General: Skin is warm and dry.  Neurological:     Mental Status: She is alert.  Psychiatric:        Mood and Affect: Mood normal.        Behavior: Behavior normal.   BP 139/87    Pulse 87    Temp 97.9 F (36.6 C) (Temporal)    Ht 5' 7" (1.702 m)    Wt (!) 304 lb (137.9 kg)    SpO2 99%    BMI 47.61 kg/m  Wt Readings from Last 3 Encounters:  02/24/21 (!) 304 lb (137.9 kg)  01/01/21 (!) 308 lb 1.6 oz (139.8 kg)  12/16/20 (!) 305 lb 6.4 oz (138.5 kg)      Assessment & Plan:   Problem  List Items Addressed This Visit       Cardiovascular and Mediastinum   Essential hypertension    Chronic - taking Lisinopril daily, slightly high today, will continue to monitor,  labs to be done next month at ENDO office.        Endocrine   Acquired hypothyroidism    Chronic - pt seeing endocrinologist now, unsure if she will continue d/t cost, labs to be done next month      Hyperlipidemia due to type 2 diabetes mellitus ([place])    Chronic - pt seeing endocrinologist now, unsure if she will continue d/t cost, labs to be done next month      Type 2 diabetes mellitus with hyperglycemia, without long-term current use of insulin ([place]) - Primary    Chronic - Unstable - Last A1C 8.8 in 12/2020- reports taking Metformin, Glipizide, Rybelsus - sees endocrinologist now, unsure if she will continue d/t cost, labs to be done next month. pt told by renal surgeon her BS too high for surgery, she has a renal mass and told the kidney & mass have to be removed together. She is sch for sgy on 3/22 - pt advised to continue monitoring CBGs bid and take to her appt, take all meds.        Other   Morbid obesity ([place])    Wt. Loss strategies reviewed including portion control, less carbs including sweets, eating most of calories earlier in day, drinking 64oz water qd, and  establishing daily exercise routine. pt started Rybelsus about 2 mos ago and is slowing losing weight.      GAD (generalized anxiety disorder)    Chronic - reports previous meds did not seem to help, she does not like taking meds and so she stopped. Reports she is now in therapy and this is helping for now. She is anxious about upcoming kidney removal surgery and whether her cancer has spread.      Insomnia due to medical condition    Chronic - reports being given trazodone, but never took, bottle is 50 years old, advised it should be still good, ok to use, if ineffective, let me know, advised on use & SE.      Other Visit Diagnoses     Screening for colon cancer       Relevant Orders   Cologuard      *Extra time (69mn) spent with patient today which consisted of chart review, discussing diagnoses, work up, treatment, answering questions, and documentation.

## 2021-02-24 NOTE — Assessment & Plan Note (Signed)
Wt. Loss strategies reviewed including portion control, less carbs including sweets, eating most of calories earlier in day, drinking 64oz water qd, and establishing daily exercise routine. pt started Rybelsus about 2 mos ago and is slowing losing weight.

## 2021-02-24 NOTE — Assessment & Plan Note (Signed)
Chronic - reports previous meds did not seem to help, she does not like taking meds and so she stopped. Reports she is now in therapy and this is helping for now. She is anxious about upcoming kidney removal surgery and whether her cancer has spread.

## 2021-02-24 NOTE — Assessment & Plan Note (Signed)
Chronic - taking Lisinopril daily, slightly high today, will continue to monitor,  labs to be done next month at ENDO office.

## 2021-02-24 NOTE — Assessment & Plan Note (Signed)
Chronic - Unstable - Last A1C 8.8 in 12/2020- reports taking Metformin, Glipizide, Rybelsus - sees endocrinologist now, unsure if she will continue d/t cost, labs to be done next month. pt told by renal surgeon her BS too high for surgery, she has a renal mass and told the kidney & mass have to be removed together. She is sch for sgy on 3/22 - pt advised to continue monitoring CBGs bid and take to her appt, take all meds.

## 2021-02-24 NOTE — Assessment & Plan Note (Signed)
Chronic - reports being given trazodone, but never took, bottle is 50 years old, advised it should be still good, ok to use, if ineffective, let me know, advised on use & SE.

## 2021-02-24 NOTE — Patient Instructions (Signed)
Welcome to [facility]! It was a pleasure meeting you today.  Keep checking your blood sugars daily - fasting in the morning and around supper time and have these numbers available for your follow up with Dr. Kelton Pillar. She should be rechecking your A1C and other labs and let her know you are now established with me as your primary provider.  Your blood pressure is not bad today, drink at least 8 cups of water daily, watch the salt/sodium in your diet, & let me know if it is still running high when you see Dr. Kelton Pillar and we can increase your dose.  Download the [facility] app to request medication refills to help you order more quickly.  It is ok to take the Trazodone - take 1 hour prior to bedtime, I can refill this when needed.     PLEASE NOTE:  If you had any LAB tests please let us know if you have not heard back within a few days. You may see your results on MyChart before we have a chance to review them but we will give you a call once they are reviewed by [place]. If we ordered any REFERRALS today, please let us know if you have not heard from their office within the next week.  Let us know through MyChart if you are needing REFILLS, or have your pharmacy send [place] the request. You can also use MyChart to communicate with me or any office staff.  Please try these tips to maintain a healthy lifestyle:  Eat most of your calories during the day when you are active. Eliminate processed foods including packaged sweets (pies, cakes, cookies), reduce intake of potatoes, white bread, white pasta, and white rice. Look for whole grain options, oat flour or almond flour.  Each meal should contain half fruits/vegetables, one quarter protein, and one quarter carbs (no bigger than a computer mouse).  Cut down on sweet beverages. This includes juice, soda, and sweet tea. Also watch fruit intake, though this is a healthier sweet option, it still contains natural sugar!  Limit to 3 servings daily.  Drink at least 1 glass of water with each meal and aim for at least 8 glasses per day  Exercise at least 150 minutes every week.

## 2021-03-05 DIAGNOSIS — F331 Major depressive disorder, recurrent, moderate: Secondary | ICD-10-CM | POA: Diagnosis not present

## 2021-03-05 NOTE — Progress Notes (Signed)
Name: Mary Moyer  MRN/ DOB: [id number], 1970/09/26   Age/ Sex: 51 y.o., female    PCP: Jeanie Sewer, NP   Reason for Endocrinology Evaluation: Type 2 Diabetes Mellitus     Date of Initial Endocrinology Visit: 12/16/2020    PATIENT IDENTIFIER: Mary Moyer is a 51 y.o. female with a past medical history of T2DM, Hypothyroidism and HTN. The patient presented for initial endocrinology clinic visit on 12/16/2020 for consultative assistance with her diabetes management.    HPI: Mary Moyer was    Diagnosed with DM 2020 Prior Medications tried/Intolerance: Glipizide, Metformin             Hemoglobin A1c has ranged from 7.4% in 2020, peaking at 11.5% in 2021.   On her initial visit to our clinic she had an A1c of 8.8%, she needed clearance for right nephrectomy after optimization of glucose control.  We started Rybelsus, continue glipizide and metformin  She was also started on atorvastatin 10 mg with an LDL of 128 mg/DL   THYROID HISTORY: She has been diagnosed with hypothyroidism in 2000. Denies prior neck sx or RAI ablation    Denies local neck symptoms Denies constipation   No FH of thyroid  Father with T2DM     SUBJECTIVE:   During the last visit (12/16/2020): A1c 8.8%, we started Rybelsus, continue glipizide and metformin     Today (03/06/21): Mary Moyer is here for a followup on diabetes management. She checks her  blood sugars 1 times daily. The patient has  had hypoglycemic episodes since the last clinic visit, which typically occur rarely . The patient is not symptomatic with these episodes.   She is pending right nephrectomy   Denies nausea, vomiting or diarrhea   HOME DIABETES REGIMEN: Glipizide 10 mg XL before supper Metformin 850 mg  BID- takes it once before supper  Rybelsus 7 mg daily Levothyroxine 200 mcg, 2 tabs on Sundays and 1 tab rest of the week Atorvastatin 10 mg daily    Statin: yes  ACE-I/ARB: yes Prior Diabetic Education:  no     METER DOWNLOAD SUMMARY: 2/17-03/06/2021 Fingerstick Blood Glucose Tests = 15 Overall Mean FS Glucose = 104 Standard Deviation = 19  BG Ranges: Low = 67 High = 133    Hypoglycemic Events/30 Days: BG < 50 = 0 Episodes of symptomatic severe hypoglycemia = 0    DIABETIC COMPLICATIONS: Microvascular complications:   Denies: CKD, retinopathy , neuropathy  Last eye exam: Completed 2021  Macrovascular complications:   Denies: CAD, PVD, CVA   PAST HISTORY: Past Medical History:  Past Medical History:  Diagnosis Date   Anxiety    Arthritis    Cancer ([place])    Chronic kidney disease    Depression    Diabetes ([place])    Dyslipidemia 12/17/2020   Fatty liver    Headache    Hyperlipidemia 03/22/2018   Hypertension    Hypothyroidism    Hypothyroidism 03/22/2018   Migraines    Thyroid disease    UTI (urinary tract infection)    Past Surgical History:  Past Surgical History:  Procedure Laterality Date   UTERINE FIBROID SURGERY  2003    Social History:  reports that she has never smoked. She has never used smokeless tobacco. She reports that she does not currently use alcohol. She reports that she does not use drugs. Family History:  Family History  Problem Relation Age of Onset   Kidney disease Mother    Arthritis Mother  Hypertension Mother    Miscarriages / [place] Mother    Depression Mother    Diabetes Father    Heart attack Father    Heart disease Father    Hyperlipidemia Father    Hypertension Father    Depression Sister    Heart disease Sister    Hypertension Sister    Kidney disease Maternal Grandmother    Miscarriages / Stillbirths Maternal Grandmother    Hypertension Paternal Grandmother    Heart attack Paternal Grandmother    Cancer Paternal Grandfather    Diabetes Paternal Grandfather    Hypertension Paternal Grandfather    Asthma Daughter    Depression Daughter    Diabetes Daughter    Hypertension Daughter    69 /  [place] Daughter    Colon cancer Neg Hx    Esophageal cancer Neg Hx    Pancreatic cancer Neg Hx    Stomach cancer Neg Hx      HOME MEDICATIONS: Allergies as of 03/06/2021       Reactions   Amlodipine Other (See Comments)   Headache with 88m   Orange Fruit [citrus]    migrains--headache   Latex Rash        Medication List        Accurate as of March 06, 2021  7:40 AM. If you have any questions, ask your nurse or doctor.          atorvastatin 10 MG tablet Commonly known as: LIPITOR Take 1 tablet (10 mg total) by mouth daily.   blood glucose meter kit and supplies Kit Dispense based on patient and insurance preference. Use once daily before breakfast. E11.65   glipiZIDE 10 MG 24 hr tablet Commonly known as: GLUCOTROL XL Take 1 tablet (10 mg total) by mouth daily before supper. With heaviest meal   levothyroxine 200 MCG tablet Commonly known as: SYNTHROID Take 1 tablet (200 mcg total) by mouth daily before breakfast. Schedule appt with endocrinology for additional refills   lisinopril 10 MG tablet Commonly known as: ZESTRIL Take 1 tablet (10 mg total) by mouth daily.   metFORMIN 850 MG tablet Commonly known as: GLUCOPHAGE Take 1 tablet (850 mg total) by mouth 2 (two) times daily with a meal.   OneTouch Verio test strip Generic drug: glucose blood 1 each by Other route in the morning and at bedtime. Use as instructed   Rybelsus 7 MG Tabs Generic drug: Semaglutide Take 7 mg by mouth daily.         ALLERGIES: Allergies  Allergen Reactions   Amlodipine Other (See Comments)    Headache with 568m  Orange Fruit [Citrus]     migrains--headache   Latex Rash    OBJECTIVE:   VITAL SIGNS: BP 128/74 (BP Location: Left Wrist, Patient Position: Sitting, Cuff Size: Large)    Pulse 72    Wt (!) 305 lb 3.2 oz (138.4 kg)    SpO2 99%    BMI 47.80 kg/m    PHYSICAL EXAM:  General: Pt appears well and is in NAD  Neck: General: Supple without adenopathy or  carotid bruits. Thyroid: Thyroid size normal.  No goiter or nodules appreciated.  Lungs: Clear with good BS bilat with no rales, rhonchi, or wheezes  Heart: RRR with normal S1 and S2 and no gallops; no murmurs; no rub  Abdomen: Normoactive bowel sounds, soft, nontender, without masses or organomegaly palpable  Extremities:  Lower extremities - No pretibial edema. No lesions.  Neuro: MS is good with appropriate  affect, pt is alert and Ox3   DM Foot Exam 03/06/2021 The skin of the feet is intact without sores or ulcerations. The pedal pulses are 2+ on right and 2+ on left. The sensation is intact to a screening 5.07, 10 gram monofilament bilaterally  DATA REVIEWED:  Lab Results  Component Value Date   HGBA1C 8.8 (A) 12/16/2020   HGBA1C 11.3 (H) 10/25/2020   HGBA1C 10.7 (H) 03/19/2020    Latest Reference Range & Units 03/06/21 08:07  Sodium 135 - 145 mEq/L 139  Potassium 3.5 - 5.1 mEq/L 4.4  Chloride 96 - 112 mEq/L 102  CO2 19 - 32 mEq/L 26  Glucose 70 - 99 mg/dL 82  BUN 6 - 23 mg/dL 17  Creatinine 0.40 - 1.20 mg/dL 0.70  Calcium 8.4 - 10.5 mg/dL 9.8  GFR >60.00 mL/min 101.63  Total CHOL/HDL Ratio  4  Cholesterol 0 - 200 mg/dL 135    Latest Reference Range & Units 03/06/21 08:07  MICROALB/CREAT RATIO 0.0 - 30.0 mg/g 1.5  NonHDL  100.63  Triglycerides 0.0 - 149.0 mg/dL 144.0  VLDL 0.0 - 40.0 mg/dL 28.8    Latest Reference Range & Units 03/06/21 08:07  TSH 0.35 - 5.50 uIU/mL 0.71    Latest Reference Range & Units 03/06/21 08:07  Creatinine,U mg/dL 97.0  Microalb, Ur 0.0 - 1.9 mg/dL 1.4  MICROALB/CREAT RATIO 0.0 - 30.0 mg/g 1.5    ASSESSMENT / PLAN / RECOMMENDATIONS:   1) Type 2 Diabetes Mellitus, Optimally  controlled, Without complications - Most recent A1c of 6.7 %. Goal A1c < 7.0 %.    - I have praised the pt on improved glycemic control -BMP, MA/CR normal today -I am going to reduce glipizide due to an episode of hypoglycemia  MEDICATIONS: Continue Rybelsus 7  mg , 1 tablet Before Breakfast  Decrease glipizide 5 mg XL , 1 tablet Before Supper  Continue metformin 850 mg , 1 tablet twice a day before meals   EDUCATION / INSTRUCTIONS: BG monitoring instructions: Patient is instructed to check her blood sugars 1 times a day, fasting . Call [facility] if: BG persistently < 70  I reviewed the Rule of 15 for the treatment of hypoglycemia in detail with the patient. Literature supplied.   2) Diabetic complications:  Eye: Does not have known diabetic retinopathy.  Neuro/ Feet: Does not have known diabetic peripheral neuropathy. Renal: Patient does not have known baseline CKD. She is  on an ACEI/ARB at present.   3) Hypothyroidism:   - TSH has normalized -No changes  Medication  Continue levothyroxine 200 mcg , TWO on sundays and 1 tablet rest of the week   4) Dyslipidemia:    -LDL and triglycerides have normalized    Medication  Continue atorvastatin 10 mg daily        Signed electronically by: Mack Guise, MD  [facility]  [facility] [address] [place], [place] [address] Phone: [phone] FAX: [phone]   CC: Jeanie Sewer, [place] [address] Phone: [phone]  Fax: [phone]    Return to Endocrinology clinic as below: Future Appointments  Date Time Provider [place]  03/18/2021  8:00 AM [facility] PAT 1 [facility] None

## 2021-03-06 ENCOUNTER — Other Ambulatory Visit: Payer: Self-pay

## 2021-03-06 ENCOUNTER — Encounter: Payer: Self-pay | Admitting: Internal Medicine

## 2021-03-06 ENCOUNTER — Ambulatory Visit: Payer: BC Managed Care – PPO | Admitting: Internal Medicine

## 2021-03-06 VITALS — BP 128/74 | HR 72 | Wt 305.2 lb

## 2021-03-06 DIAGNOSIS — Z794 Long term (current) use of insulin: Secondary | ICD-10-CM | POA: Diagnosis not present

## 2021-03-06 DIAGNOSIS — E039 Hypothyroidism, unspecified: Secondary | ICD-10-CM | POA: Diagnosis not present

## 2021-03-06 DIAGNOSIS — E785 Hyperlipidemia, unspecified: Secondary | ICD-10-CM | POA: Diagnosis not present

## 2021-03-06 DIAGNOSIS — E1165 Type 2 diabetes mellitus with hyperglycemia: Secondary | ICD-10-CM | POA: Diagnosis not present

## 2021-03-06 LAB — POCT GLYCOSYLATED HEMOGLOBIN (HGB A1C): Hemoglobin A1C: 6.7 % — AB (ref 4.0–5.6)

## 2021-03-06 LAB — BASIC METABOLIC PANEL
BUN: 17 mg/dL (ref 6–23)
CO2: 26 mEq/L (ref 19–32)
Calcium: 9.8 mg/dL (ref 8.4–10.5)
Chloride: 102 mEq/L (ref 96–112)
Creatinine, Ser: 0.7 mg/dL (ref 0.40–1.20)
GFR: 101.63 mL/min (ref >60.00)
Glucose, Bld: 82 mg/dL (ref 70–99)
Potassium: 4.4 mEq/L (ref 3.5–5.1)
Sodium: 139 mEq/L (ref 135–145)

## 2021-03-06 LAB — LIPID PANEL
Cholesterol: 135 mg/dL (ref 0–200)
HDL: 34.5 mg/dL — ABNORMAL LOW (ref >39.00)
LDL Cholesterol: 72 mg/dL (ref 0–99)
NonHDL: 100.63
Total CHOL/HDL Ratio: 4
Triglycerides: 144 mg/dL (ref 0.0–149.0)
VLDL: 28.8 mg/dL (ref 0.0–40.0)

## 2021-03-06 LAB — MICROALBUMIN / CREATININE URINE RATIO
Creatinine,U: 97 mg/dL
Microalb Creat Ratio: 1.5 mg/g (ref 0.0–30.0)
Microalb, Ur: 1.4 mg/dL (ref 0.0–1.9)

## 2021-03-06 LAB — TSH: TSH: 0.71 u[IU]/mL (ref 0.35–5.50)

## 2021-03-06 MED ORDER — GLIPIZIDE ER 5 MG PO TB24: 5.0000 mg | ORAL_TABLET | Freq: Every day | ORAL | 3 refills | Status: DC

## 2021-03-06 NOTE — Patient Instructions (Signed)
-   Keep Up the Good Work ! ?- Continue Rybelsus 7 mg , 1 tablet Before Breakfast ?- Decrease Glipizide 5 mg XL , 1 tablet Before Supper  ?- Take Metformin 850 mg , 1 tablet twice a day ? ? ? ? ?HOW TO TREAT LOW BLOOD SUGARS (Blood sugar LESS THAN 70 MG/DL) ?Please follow the RULE OF 15 for the treatment of hypoglycemia treatment (when your (blood sugars are less than 70 mg/dL)  ? ?STEP 1: Take 15 grams of carbohydrates when your blood sugar is low, which includes:  ?3-4 GLUCOSE TABS  OR ?3-4 OZ OF JUICE OR REGULAR SODA OR ?ONE TUBE OF GLUCOSE GEL   ? ?STEP 2: RECHECK blood sugar in 15 MINUTES ?STEP 3: If your blood sugar is still low at the 15 minute recheck --> then, go back to STEP 1 and treat AGAIN with another 15 grams of carbohydrates. ? ?

## 2021-03-07 ENCOUNTER — Encounter: Payer: Self-pay | Admitting: Internal Medicine

## 2021-03-07 MED ORDER — LEVOTHYROXINE SODIUM 200 MCG PO TABS: 200.0000 ug | ORAL_TABLET | ORAL | 3 refills | Status: DC

## 2021-03-07 MED ORDER — RYBELSUS 7 MG PO TABS: 7.0000 mg | ORAL_TABLET | Freq: Every day | ORAL | 3 refills | Status: DC

## 2021-03-11 DIAGNOSIS — D49511 Neoplasm of unspecified behavior of right kidney: Secondary | ICD-10-CM | POA: Diagnosis not present

## 2021-03-12 ENCOUNTER — Other Ambulatory Visit (HOSPITAL_COMMUNITY): Payer: Self-pay

## 2021-03-14 NOTE — Patient Instructions (Signed)
DUE TO COVID-19 ONLY ONE VISITOR  (aged 50 and older)  IS ALLOWED TO COME WITH YOU AND STAY IN THE WAITING ROOM ONLY DURING PRE OP AND PROCEDURE.   **NO VISITORS ARE ALLOWED IN THE SHORT STAY AREA OR RECOVERY ROOM!!**  IF YOU WILL BE ADMITTED INTO THE HOSPITAL YOU ARE ALLOWED ONLY TWO SUPPORT PEOPLE DURING VISITATION HOURS ONLY (7 AM -8PM)   The support person(s) must pass our screening, gel in and out, and wear a mask at all times, including in the patients room. Patients must also wear a mask when staff or their support person are in the room. Visitors GUEST BADGE MUST BE WORN VISIBLY  One adult visitor may remain with you overnight and MUST be in the room by 8 P.M.     COVID SWAB TESTING MUST BE COMPLETED ON:  03/24/21    (*ARRIVE AT YOUR APPOINTMENT TIME STAFF IS NOT HERE BEFORE 8AM!!!*)    Site: [facility] [address] [place] Enter: Main Entrance have a seat in the waiting area to the right of main entrance (DO NOT [place]!!!!!) Dial: [phone] to alert staff you have arrived  You are not required to quarantine, however you are required to wear a well-fitted mask when you are out and around people not in your household.  Hand Hygiene often Do NOT share personal items Notify your provider if you are in close contact with someone who has COVID or you develop fever 100.4 or greater, new onset of sneezing, cough, sore throat, shortness of breath or body aches.  [address], [address], must go inside of the hospital, NOT A DRIVE THRU!  (Must self quarantine after testing. Follow instructions on handout.)       Your procedure is scheduled on: 03/26/21   Report to [facility] Main Entrance    Report to admitting at : 10:15 AM   Call this number if you have problems the morning of surgery [phone]   Do not eat food :After Midnight.   After Midnight you may have the  following liquids until : 9:30 AM DAY OF SURGERY  Water Black Coffee (sugar ok, NO MILK/CREAM OR CREAMERS)  Tea (sugar ok, NO MILK/CREAM OR CREAMERS) regular and decaf                             Plain Jell-O (NO RED)                                           Fruit ices (not with fruit pulp, NO RED)                                     Popsicles (NO RED)                                                                  Juice: apple, WHITE grape, WHITE cranberry Sports drinks like Gatorade (NO RED) Clear broth(vegetable,chicken,beef)  FOLLOW BOWEL PREP AND ANY ADDITIONAL PRE OP INSTRUCTIONS YOU RECEIVED FROM YOUR SURGEON'S OFFICE!!!   Oral Hygiene is also important to reduce your risk of infection.                                    Remember - BRUSH YOUR TEETH THE MORNING OF SURGERY WITH YOUR REGULAR TOOTHPASTE   Do NOT smoke after Midnight   Take these medicines the morning of surgery with A SIP OF WATER: synthroid.  How to Manage Your Diabetes Before and After Surgery  Why is it important to control my blood sugar before and after surgery? Improving blood sugar levels before and after surgery helps healing and can limit problems. A way of improving blood sugar control is eating a healthy diet by:  Eating less sugar and carbohydrates  Increasing activity/exercise  Talking with your doctor about reaching your blood sugar goals High blood sugars (greater than 180 mg/dL) can raise your risk of infections and slow your recovery, so you will need to focus on controlling your diabetes during the weeks before surgery. Make sure that the doctor who takes care of your diabetes knows about your planned surgery including the date and location.  How do I manage my blood sugar before surgery? Check your blood sugar at least 4 times a day, starting 2 days before surgery, to make sure that the level is not too high or low. Check your blood sugar the morning of your surgery when you wake up and  every 2 hours until you get to the Short Stay unit. If your blood sugar is less than 70 mg/dL, you will need to treat for low blood sugar: Do not take insulin. Treat a low blood sugar (less than 70 mg/dL) with  cup of clear juice (cranberry or apple), 4 glucose tablets, OR glucose gel. Recheck blood sugar in 15 minutes after treatment (to make sure it is greater than 70 mg/dL). If your blood sugar is not greater than 70 mg/dL on recheck, call [phone] for further instructions. Report your blood sugar to the short stay nurse when you get to Short Stay.  If you are admitted to the hospital after surgery: Your blood sugar will be checked by the staff and you will probably be given insulin after surgery (instead of oral diabetes medicines) to make sure you have good blood sugar levels. The goal for blood sugar control after surgery is 80-180 mg/dL.   WHAT DO I DO ABOUT MY DIABETES MEDICATION?  Do not take oral diabetes medicines (pills) the morning of surgery.  THE DAY BEFORE SURGERY, take metformin and semaglutide as usual.DO NOT take glipizide at night time.       THE MORNING OF SURGERY,DO NOT TAKE ANY ORAL DIABETIC MEDICATIONS DAY OF YOUR SURGERY  Bring CPAP mask and tubing day of surgery.                              You may not have any metal on your body including hair pins, jewelry, and body piercing             Do not wear make-up, lotions, powders, perfumes/cologne, or deodorant  Do not wear nail polish including gel and S&S, artificial/acrylic nails, or any other type of covering on natural nails including finger and toenails. If you have artificial nails, gel coating, etc.  that needs to be removed by a nail salon please have this removed prior to surgery or surgery may need to be canceled/ delayed if the surgeon/ anesthesia feels like they are unable to be safely monitored.   Do not shave  48 hours prior to surgery.   Do not bring valuables to the hospital. [place].   Contacts, dentures or bridgework may not be worn into surgery.   Bring small overnight bag day of surgery.    Patients discharged on the day of surgery will not be allowed to drive home.  Someone NEEDS to stay with you for the first 24 hours after anesthesia.   Special Instructions: Bring a copy of your healthcare power of attorney and living will documents         the day of surgery if you haven't scanned them before.              Please read over the following fact sheets you were given: IF YOU HAVE QUESTIONS ABOUT YOUR PRE-OP INSTRUCTIONS PLEASE CALL [phone]     [facility] - Preparing for Surgery Before surgery, you can play an important role.  Because skin is not sterile, your skin needs to be as free of germs as possible.  You can reduce the number of germs on your skin by washing with CHG (chlorahexidine gluconate) soap before surgery.  CHG is an antiseptic cleaner which kills germs and bonds with the skin to continue killing germs even after washing. Please DO NOT use if you have an allergy to CHG or antibacterial soaps.  If your skin becomes reddened/irritated stop using the CHG and inform your nurse when you arrive at Short Stay. Do not shave (including legs and underarms) for at least 48 hours prior to the first CHG shower.  You may shave your face/neck. Please follow these instructions carefully:  1.  Shower with CHG Soap the night before surgery and the  morning of Surgery.  2.  If you choose to wash your hair, wash your hair first as usual with your  normal  shampoo.  3.  After you shampoo, rinse your hair and body thoroughly to remove the  shampoo.                           4.  Use CHG as you would any other liquid soap.  You can apply chg directly  to the skin and wash                       Gently with a scrungie or clean washcloth.  5.  Apply the CHG Soap to your body ONLY FROM THE NECK DOWN.   Do not use on face/ open                            Wound or open sores. Avoid contact with eyes, ears mouth and genitals (private parts).                       Wash face,  Genitals (private parts) with your normal soap.             6.  Wash thoroughly, paying special attention to the area where your surgery  will be performed.  7.  Thoroughly rinse your body with warm water from the neck down.  8.  DO NOT shower/wash with your normal soap after using and rinsing off  the CHG Soap.                9.  Pat yourself dry with a clean towel.            10.  Wear clean pajamas.            11.  Place clean sheets on your bed the night of your first shower and do not  sleep with pets. Day of Surgery : Do not apply any lotions/deodorants the morning of surgery.  Please wear clean clothes to the hospital/surgery center.  FAILURE TO FOLLOW THESE INSTRUCTIONS MAY RESULT IN THE CANCELLATION OF YOUR SURGERY PATIENT SIGNATURE_________________________________  NURSE SIGNATURE__________________________________  ________________________________________________________________________  WHAT IS A BLOOD TRANSFUSION? Blood Transfusion Information  A transfusion is the replacement of blood or some of its parts. Blood is made up of multiple cells which provide different functions. Red blood cells carry oxygen and are used for blood loss replacement. White blood cells fight against infection. Platelets control bleeding. Plasma helps clot blood. Other blood products are available for specialized needs, such as hemophilia or other clotting disorders. BEFORE THE TRANSFUSION  Who gives blood for transfusions?  Healthy volunteers who are fully evaluated to make sure their blood is safe. This is blood bank blood. Transfusion therapy is the safest it has ever been in the practice of medicine. Before blood is taken from a donor, a complete history is taken to make sure that person has no history of diseases nor engages in risky social behavior (examples  are intravenous drug use or sexual activity with multiple partners). The donor's travel history is screened to minimize risk of transmitting infections, such as malaria. The donated blood is tested for signs of infectious diseases, such as HIV and hepatitis. The blood is then tested to be sure it is compatible with you in order to minimize the chance of a transfusion reaction. If you or a relative donates blood, this is often done in anticipation of surgery and is not appropriate for emergency situations. It takes many days to process the donated blood. RISKS AND COMPLICATIONS Although transfusion therapy is very safe and saves many lives, the main dangers of transfusion include:  Getting an infectious disease. Developing a transfusion reaction. This is an allergic reaction to something in the blood you were given. Every precaution is taken to prevent this. The decision to have a blood transfusion has been considered carefully by your caregiver before blood is given. Blood is not given unless the benefits outweigh the risks. AFTER THE TRANSFUSION Right after receiving a blood transfusion, you will usually feel much better and more energetic. This is especially true if your red blood cells have gotten low (anemic). The transfusion raises the level of the red blood cells which carry oxygen, and this usually causes an energy increase. The nurse administering the transfusion will monitor you carefully for complications. HOME CARE INSTRUCTIONS  No special instructions are needed after a transfusion. You may find your energy is better. Speak with your caregiver about any limitations on activity for underlying diseases you may have. SEEK MEDICAL CARE IF:  Your condition is not improving after your transfusion. You develop redness or irritation at the intravenous (IV) site. SEEK IMMEDIATE MEDICAL CARE IF:  Any of the following symptoms occur over the next 12 hours: Shaking chills. You have  a temperature by  mouth above 102 F (38.9 C), not controlled by medicine. Chest, back, or muscle pain. People around you feel you are not acting correctly or are confused. Shortness of breath or difficulty breathing. Dizziness and fainting. You get a rash or develop hives. You have a decrease in urine output. Your urine turns a dark color or changes to pink, red, or brown. Any of the following symptoms occur over the next 10 days: You have a temperature by mouth above 102 F (38.9 C), not controlled by medicine. Shortness of breath. Weakness after normal activity. The white part of the eye turns yellow (jaundice). You have a decrease in the amount of urine or are urinating less often. Your urine turns a dark color or changes to pink, red, or brown. Document Released: 12/20/1999 Document Revised: 03/16/2011 Document Reviewed: 08/08/2007 [facility] Patient Information 2014 [place], [place].  _______________________________________________________________________

## 2021-03-18 ENCOUNTER — Encounter (HOSPITAL_COMMUNITY)
Admission: RE | Admit: 2021-03-18 | Discharge: 2021-03-18 | Disposition: A | Discharge status: Home / Self Care | Payer: BC Managed Care – PPO | Source: Ambulatory Visit | Attending: Urology | Admitting: Urology

## 2021-03-18 ENCOUNTER — Encounter (HOSPITAL_COMMUNITY): Payer: Self-pay

## 2021-03-18 ENCOUNTER — Other Ambulatory Visit: Payer: Self-pay

## 2021-03-18 ENCOUNTER — Encounter: Payer: Self-pay | Admitting: Family

## 2021-03-18 VITALS — BP 147/77 | HR 69 | Temp 97.7°F | Ht 67.0 in | Wt 307.0 lb

## 2021-03-18 DIAGNOSIS — Z01812 Encounter for preprocedural laboratory examination: Secondary | ICD-10-CM | POA: Insufficient documentation

## 2021-03-18 DIAGNOSIS — Z20822 Contact with and (suspected) exposure to covid-19: Secondary | ICD-10-CM | POA: Diagnosis not present

## 2021-03-18 DIAGNOSIS — Z01818 Encounter for other preprocedural examination: Secondary | ICD-10-CM

## 2021-03-18 HISTORY — DX: Anemia, unspecified: D64.9

## 2021-03-18 LAB — GLUCOSE, CAPILLARY: Glucose-Capillary: 110 mg/dL — ABNORMAL HIGH (ref 70–99)

## 2021-03-18 LAB — BASIC METABOLIC PANEL
Anion gap: 8 (ref 5–15)
BUN: 14 mg/dL (ref 6–20)
CO2: 26 mmol/L (ref 22–32)
Calcium: 8.9 mg/dL (ref 8.9–10.3)
Chloride: 105 mmol/L (ref 98–111)
Creatinine, Ser: 0.62 mg/dL (ref 0.44–1.00)
GFR, Estimated: >60 mL/min (ref >60)
Glucose, Bld: 98 mg/dL (ref 70–99)
Potassium: 4 mmol/L (ref 3.5–5.1)
Sodium: 139 mmol/L (ref 135–145)

## 2021-03-18 LAB — CBC
HCT: 39.4 % (ref 36.0–46.0)
Hemoglobin: 12.3 g/dL (ref 12.0–15.0)
MCH: 27.9 pg (ref 26.0–34.0)
MCHC: 31.2 g/dL (ref 30.0–36.0)
MCV: 89.3 fL (ref 80.0–100.0)
Platelets: 223 10*3/uL (ref 150–400)
RBC: 4.41 MIL/uL (ref 3.87–5.11)
RDW: 12.9 % (ref 11.5–15.5)
WBC: 6.2 10*3/uL (ref 4.0–10.5)
nRBC: 0 % (ref 0.0–0.2)

## 2021-03-18 NOTE — Progress Notes (Addendum)
For Short Stay: ?[place] appointment date: N/A ?Date of COVID positive in last 90 days: N/A ?COVID Vaccine: Pfizer x 3: 01/26/19 ?Bowel Prep reminder: ? ? ?For Anesthesia: ?PCP - Jeanie Sewer: NP ?Cardiologist - N/A ? ?Chest x-ray - 09/09/20 ?EKG - 10/25/20 ?Stress Test -  ?ECHO -  ?Cardiac Cath -  ?Pacemaker/ICD device last checked: ?Pacemaker orders received: ?Device Rep notified: ? ?Spinal Cord Stimulator: ? ?Sleep Study -  ?CPAP -  ? ?Fasting Blood Sugar - 100's ?Checks Blood Sugar ___1__ times a day ?Date and result of last Hgb A1c-6.7: 03/06/21: EPIC ? ?Blood Thinner Instructions: ?Aspirin Instructions: ?Last Dose: ? ?Activity level: Can go up a flight of stairs and activities of daily living without stopping and without chest pain and/or shortness of breath ?  Able to exercise without chest pain and/or shortness of breath ?  Unable to go up a flight of stairs without chest pain and/or shortness of breath ?   ? ?Anesthesia review: Hx: HTN,DIA ? ?Patient denies shortness of breath, fever, cough and chest pain at PAT appointment ? ? ?Patient verbalized understanding of instructions that were given to them at the PAT appointment. Patient was also instructed that they will need to review over the PAT instructions again at home before surgery.  ?

## 2021-03-19 ENCOUNTER — Encounter: Payer: Self-pay | Admitting: Internal Medicine

## 2021-03-19 NOTE — Telephone Encounter (Signed)
See note

## 2021-03-24 ENCOUNTER — Other Ambulatory Visit: Payer: Self-pay

## 2021-03-24 ENCOUNTER — Encounter: Payer: Self-pay | Admitting: Family

## 2021-03-24 ENCOUNTER — Other Ambulatory Visit (HOSPITAL_COMMUNITY): Payer: BC Managed Care – PPO

## 2021-03-24 DIAGNOSIS — I10 Essential (primary) hypertension: Secondary | ICD-10-CM

## 2021-03-24 DIAGNOSIS — D49511 Neoplasm of unspecified behavior of right kidney: Secondary | ICD-10-CM | POA: Diagnosis not present

## 2021-03-24 MED ORDER — LISINOPRIL 10 MG PO TABS: 10.0000 mg | ORAL_TABLET | Freq: Every day | ORAL | 3 refills | Status: DC

## 2021-03-26 ENCOUNTER — Other Ambulatory Visit: Payer: Self-pay

## 2021-03-26 ENCOUNTER — Encounter (HOSPITAL_COMMUNITY): Payer: Self-pay | Admitting: Urology

## 2021-03-26 ENCOUNTER — Inpatient Hospital Stay (HOSPITAL_COMMUNITY): Payer: BC Managed Care – PPO | Admitting: Certified Registered Nurse Anesthetist

## 2021-03-26 ENCOUNTER — Inpatient Hospital Stay (HOSPITAL_COMMUNITY)
Admission: RE | Admit: 2021-03-26 | Discharge: 2021-03-28 | DRG: 657 | Disposition: A | Discharge status: Home / Self Care | Payer: BC Managed Care – PPO | Attending: Urology | Admitting: Urology

## 2021-03-26 ENCOUNTER — Encounter (HOSPITAL_COMMUNITY)
Admission: RE | Disposition: A | Discharge status: Home / Self Care | Payer: Self-pay | Source: Home / Self Care | Attending: Urology

## 2021-03-26 DIAGNOSIS — K760 Fatty (change of) liver, not elsewhere classified: Secondary | ICD-10-CM | POA: Diagnosis not present

## 2021-03-26 DIAGNOSIS — N280 Ischemia and infarction of kidney: Secondary | ICD-10-CM | POA: Diagnosis not present

## 2021-03-26 DIAGNOSIS — Z888 Allergy status to other drugs, medicaments and biological substances status: Secondary | ICD-10-CM | POA: Diagnosis not present

## 2021-03-26 DIAGNOSIS — Z791 Long term (current) use of non-steroidal anti-inflammatories (NSAID): Secondary | ICD-10-CM

## 2021-03-26 DIAGNOSIS — N2889 Other specified disorders of kidney and ureter: Principal | ICD-10-CM | POA: Diagnosis present

## 2021-03-26 DIAGNOSIS — C641 Malignant neoplasm of right kidney, except renal pelvis: Principal | ICD-10-CM | POA: Diagnosis present

## 2021-03-26 DIAGNOSIS — Z7984 Long term (current) use of oral hypoglycemic drugs: Secondary | ICD-10-CM

## 2021-03-26 DIAGNOSIS — Z79890 Hormone replacement therapy (postmenopausal): Secondary | ICD-10-CM | POA: Diagnosis not present

## 2021-03-26 DIAGNOSIS — E119 Type 2 diabetes mellitus without complications: Secondary | ICD-10-CM | POA: Diagnosis present

## 2021-03-26 DIAGNOSIS — F419 Anxiety disorder, unspecified: Secondary | ICD-10-CM | POA: Diagnosis present

## 2021-03-26 DIAGNOSIS — E669 Obesity, unspecified: Secondary | ICD-10-CM | POA: Diagnosis not present

## 2021-03-26 DIAGNOSIS — I10 Essential (primary) hypertension: Secondary | ICD-10-CM | POA: Diagnosis not present

## 2021-03-26 DIAGNOSIS — E039 Hypothyroidism, unspecified: Secondary | ICD-10-CM | POA: Diagnosis present

## 2021-03-26 DIAGNOSIS — Z91040 Latex allergy status: Secondary | ICD-10-CM

## 2021-03-26 DIAGNOSIS — K7689 Other specified diseases of liver: Secondary | ICD-10-CM | POA: Diagnosis not present

## 2021-03-26 DIAGNOSIS — F32A Depression, unspecified: Secondary | ICD-10-CM | POA: Diagnosis not present

## 2021-03-26 DIAGNOSIS — Z01818 Encounter for other preprocedural examination: Secondary | ICD-10-CM

## 2021-03-26 DIAGNOSIS — M1991 Primary osteoarthritis, unspecified site: Secondary | ICD-10-CM | POA: Diagnosis present

## 2021-03-26 DIAGNOSIS — F418 Other specified anxiety disorders: Secondary | ICD-10-CM | POA: Diagnosis not present

## 2021-03-26 DIAGNOSIS — Z6841 Body Mass Index (BMI) 40.0 and over, adult: Secondary | ICD-10-CM | POA: Diagnosis not present

## 2021-03-26 DIAGNOSIS — Z79899 Other long term (current) drug therapy: Secondary | ICD-10-CM

## 2021-03-26 HISTORY — PX: ROBOT ASSISTED LAPAROSCOPIC NEPHRECTOMY: SHX5140

## 2021-03-26 LAB — ABO/RH: ABO/RH(D): AB POS

## 2021-03-26 LAB — GLUCOSE, CAPILLARY
Glucose-Capillary: 165 mg/dL — ABNORMAL HIGH (ref 70–99)
Glucose-Capillary: 187 mg/dL — ABNORMAL HIGH (ref 70–99)
Glucose-Capillary: 182 mg/dL — ABNORMAL HIGH (ref 70–99)

## 2021-03-26 LAB — TYPE AND SCREEN
ABO/RH(D): AB POS
Antibody Screen: NEGATIVE

## 2021-03-26 LAB — PREGNANCY, URINE: Preg Test, Ur: NEGATIVE

## 2021-03-26 LAB — HEMOGLOBIN AND HEMATOCRIT, BLOOD
HCT: 40.5 % (ref 36.0–46.0)
Hemoglobin: 12.9 g/dL (ref 12.0–15.0)

## 2021-03-26 SURGERY — NEPHRECTOMY, RADICAL, ROBOT-ASSISTED, LAPAROSCOPIC, ADULT
Anesthesia: General | Site: Renal | Laterality: Right

## 2021-03-26 MED ORDER — DOCUSATE SODIUM 100 MG PO CAPS
100.0000 mg | ORAL_CAPSULE | Freq: Two times a day (BID) | ORAL | Status: DC
Start: 2021-03-26 — End: 2021-07-15

## 2021-03-26 MED ORDER — LIDOCAINE 2% (20 MG/ML) 5 ML SYRINGE
INTRAMUSCULAR | Status: DC | PRN
  Administered 2021-03-26: 60 mg via INTRAVENOUS

## 2021-03-26 MED ORDER — DOCUSATE SODIUM 100 MG PO CAPS
100.0000 mg | ORAL_CAPSULE | Freq: Two times a day (BID) | ORAL | Status: DC
  Administered 2021-03-27 – 2021-03-28 (×2): 100 mg via ORAL
  Filled 2021-03-26 (×4): qty 1

## 2021-03-26 MED ORDER — ACETAMINOPHEN 500 MG PO TABS
1000.0000 mg | ORAL_TABLET | Freq: Four times a day (QID) | ORAL | Status: AC
  Administered 2021-03-26 – 2021-03-27 (×4): 1000 mg via ORAL
  Filled 2021-03-26 (×4): qty 2

## 2021-03-26 MED ORDER — SUGAMMADEX SODIUM 500 MG/5ML IV SOLN
INTRAVENOUS | Status: DC | PRN
  Administered 2021-03-26: 300 mg via INTRAVENOUS

## 2021-03-26 MED ORDER — OXYCODONE HCL 5 MG PO TABS
5.0000 mg | ORAL_TABLET | ORAL | Status: DC | PRN
  Administered 2021-03-27 – 2021-03-28 (×7): 5 mg via ORAL
  Filled 2021-03-26 (×8): qty 1

## 2021-03-26 MED ORDER — PROMETHAZINE HCL 12.5 MG PO TABS: 12.5000 mg | ORAL_TABLET | ORAL | 0 refills | Status: DC | PRN

## 2021-03-26 MED ORDER — PROPOFOL 10 MG/ML IV BOLUS
INTRAVENOUS | Status: DC | PRN
  Administered 2021-03-26: 150 mg via INTRAVENOUS

## 2021-03-26 MED ORDER — LACTATED RINGERS IR SOLN
Status: DC | PRN
  Administered 2021-03-26: 1000 mL

## 2021-03-26 MED ORDER — FENTANYL CITRATE (PF) 250 MCG/5ML IJ SOLN
INTRAMUSCULAR | Status: AC
  Filled 2021-03-26: qty 5

## 2021-03-26 MED ORDER — ROCURONIUM BROMIDE 10 MG/ML (PF) SYRINGE
PREFILLED_SYRINGE | INTRAVENOUS | Status: AC
  Filled 2021-03-26: qty 10

## 2021-03-26 MED ORDER — EPHEDRINE SULFATE-NACL 50-0.9 MG/10ML-% IV SOSY
PREFILLED_SYRINGE | INTRAVENOUS | Status: DC | PRN
Start: 2021-03-26 — End: 2021-03-26
  Administered 2021-03-26: 5 mg via INTRAVENOUS

## 2021-03-26 MED ORDER — PHENYLEPHRINE 40 MCG/ML (10ML) SYRINGE FOR IV PUSH (FOR BLOOD PRESSURE SUPPORT)
PREFILLED_SYRINGE | INTRAVENOUS | Status: DC | PRN
  Administered 2021-03-26: 80 ug via INTRAVENOUS
  Administered 2021-03-26 (×2): 120 ug via INTRAVENOUS
  Administered 2021-03-26: 80 ug via INTRAVENOUS

## 2021-03-26 MED ORDER — ONDANSETRON HCL 4 MG/2ML IJ SOLN
INTRAMUSCULAR | Status: DC | PRN
  Administered 2021-03-26: 4 mg via INTRAVENOUS

## 2021-03-26 MED ORDER — FENTANYL CITRATE PF 50 MCG/ML IJ SOSY
25.0000 ug | PREFILLED_SYRINGE | INTRAMUSCULAR | Status: DC | PRN
  Administered 2021-03-26 (×3): 50 ug via INTRAVENOUS

## 2021-03-26 MED ORDER — SCOPOLAMINE 1 MG/3DAYS TD PT72
1.0000 | MEDICATED_PATCH | TRANSDERMAL | Status: DC
  Administered 2021-03-26: 1.5 mg via TRANSDERMAL
  Filled 2021-03-26: qty 1

## 2021-03-26 MED ORDER — PROPOFOL 10 MG/ML IV BOLUS
INTRAVENOUS | Status: AC
  Filled 2021-03-26: qty 20

## 2021-03-26 MED ORDER — OXYCODONE HCL 5 MG/5ML PO SOLN: 5.0000 mg | Freq: Once | ORAL | Status: DC | PRN

## 2021-03-26 MED ORDER — SODIUM CHLORIDE (PF) 0.9 % IJ SOLN
INTRAMUSCULAR | Status: AC
  Filled 2021-03-26: qty 20

## 2021-03-26 MED ORDER — ACETAMINOPHEN 500 MG PO TABS
1000.0000 mg | ORAL_TABLET | Freq: Once | ORAL | Status: AC
Start: 2021-03-26 — End: 2021-03-26
  Administered 2021-03-26: 1000 mg via ORAL
  Filled 2021-03-26: qty 2

## 2021-03-26 MED ORDER — DEXAMETHASONE SODIUM PHOSPHATE 4 MG/ML IJ SOLN
INTRAMUSCULAR | Status: DC | PRN
  Administered 2021-03-26: 5 mg via INTRAVENOUS

## 2021-03-26 MED ORDER — GLYCOPYRROLATE 0.2 MG/ML IJ SOLN
INTRAMUSCULAR | Status: AC
  Filled 2021-03-26: qty 1

## 2021-03-26 MED ORDER — MIDAZOLAM HCL 2 MG/2ML IJ SOLN
INTRAMUSCULAR | Status: AC
  Filled 2021-03-26: qty 2

## 2021-03-26 MED ORDER — MIDAZOLAM HCL 5 MG/5ML IJ SOLN
INTRAMUSCULAR | Status: DC | PRN
Start: 2021-03-26 — End: 2021-03-26
  Administered 2021-03-26: 2 mg via INTRAVENOUS

## 2021-03-26 MED ORDER — LACTATED RINGERS IV SOLN: INTRAVENOUS | Status: DC

## 2021-03-26 MED ORDER — STERILE WATER FOR IRRIGATION IR SOLN
Status: DC | PRN
  Administered 2021-03-26: 1000 mL

## 2021-03-26 MED ORDER — ATORVASTATIN CALCIUM 10 MG PO TABS
10.0000 mg | ORAL_TABLET | Freq: Every evening | ORAL | Status: DC
  Administered 2021-03-26 – 2021-03-27 (×2): 10 mg via ORAL
  Filled 2021-03-26 (×2): qty 1

## 2021-03-26 MED ORDER — KETOROLAC TROMETHAMINE 30 MG/ML IJ SOLN
30.0000 mg | Freq: Once | INTRAMUSCULAR | Status: AC | PRN
  Administered 2021-03-26: 30 mg via INTRAVENOUS

## 2021-03-26 MED ORDER — KETOROLAC TROMETHAMINE 30 MG/ML IJ SOLN
INTRAMUSCULAR | Status: AC
  Filled 2021-03-26: qty 1

## 2021-03-26 MED ORDER — DIPHENHYDRAMINE HCL 50 MG/ML IJ SOLN: 12.5000 mg | Freq: Four times a day (QID) | INTRAMUSCULAR | Status: DC | PRN

## 2021-03-26 MED ORDER — BUPIVACAINE LIPOSOME 1.3 % IJ SUSP
INTRAMUSCULAR | Status: AC
  Filled 2021-03-26: qty 20

## 2021-03-26 MED ORDER — LEVOTHYROXINE SODIUM 100 MCG PO TABS
200.0000 ug | ORAL_TABLET | ORAL | Status: DC
  Administered 2021-03-27 – 2021-03-28 (×2): 200 ug via ORAL
  Filled 2021-03-26: qty 1
  Filled 2021-03-26 (×2): qty 2

## 2021-03-26 MED ORDER — HYOSCYAMINE SULFATE 0.125 MG SL SUBL
0.1250 mg | SUBLINGUAL_TABLET | SUBLINGUAL | Status: DC | PRN
  Filled 2021-03-26: qty 1

## 2021-03-26 MED ORDER — LIDOCAINE HCL (PF) 2 % IJ SOLN
INTRAMUSCULAR | Status: AC
  Filled 2021-03-26: qty 5

## 2021-03-26 MED ORDER — INSULIN ASPART 100 UNIT/ML IJ SOLN
0.0000 [IU] | Freq: Three times a day (TID) | INTRAMUSCULAR | Status: DC
  Administered 2021-03-26: 3 [IU] via SUBCUTANEOUS
  Administered 2021-03-27 (×2): 2 [IU] via SUBCUTANEOUS
  Administered 2021-03-27: 3 [IU] via SUBCUTANEOUS
  Administered 2021-03-28: 2 [IU] via SUBCUTANEOUS
  Administered 2021-03-28: 3 [IU] via SUBCUTANEOUS

## 2021-03-26 MED ORDER — DIPHENHYDRAMINE HCL 12.5 MG/5ML PO ELIX: 12.5000 mg | ORAL_SOLUTION | Freq: Four times a day (QID) | ORAL | Status: DC | PRN

## 2021-03-26 MED ORDER — CEFAZOLIN SODIUM-DEXTROSE 1-4 GM/50ML-% IV SOLN
1.0000 g | Freq: Three times a day (TID) | INTRAVENOUS | Status: AC
  Administered 2021-03-26 – 2021-03-27 (×2): 1 g via INTRAVENOUS
  Filled 2021-03-26 (×2): qty 50

## 2021-03-26 MED ORDER — ORAL CARE MOUTH RINSE: 15.0000 mL | Freq: Once | OROMUCOSAL | Status: AC

## 2021-03-26 MED ORDER — HEMOSTATIC AGENTS (NO CHARGE) OPTIME
TOPICAL | Status: DC | PRN
Start: 2021-03-26 — End: 2021-03-26
  Administered 2021-03-26: 1 via TOPICAL

## 2021-03-26 MED ORDER — SUGAMMADEX SODIUM 500 MG/5ML IV SOLN
INTRAVENOUS | Status: AC
  Filled 2021-03-26: qty 5

## 2021-03-26 MED ORDER — LEVOTHYROXINE SODIUM 100 MCG PO TABS: 400.0000 ug | ORAL_TABLET | ORAL | Status: DC

## 2021-03-26 MED ORDER — FENTANYL CITRATE PF 50 MCG/ML IJ SOSY
PREFILLED_SYRINGE | INTRAMUSCULAR | Status: AC
  Filled 2021-03-26: qty 3

## 2021-03-26 MED ORDER — ONDANSETRON HCL 4 MG/2ML IJ SOLN
4.0000 mg | INTRAMUSCULAR | Status: DC | PRN
  Administered 2021-03-26 (×2): 4 mg via INTRAVENOUS
  Filled 2021-03-26 (×2): qty 2

## 2021-03-26 MED ORDER — DEXAMETHASONE SODIUM PHOSPHATE 10 MG/ML IJ SOLN
INTRAMUSCULAR | Status: AC
  Filled 2021-03-26: qty 1

## 2021-03-26 MED ORDER — EPHEDRINE 5 MG/ML INJ
INTRAVENOUS | Status: AC
  Filled 2021-03-26: qty 5

## 2021-03-26 MED ORDER — HYDROMORPHONE HCL 1 MG/ML IJ SOLN
0.5000 mg | INTRAMUSCULAR | Status: DC | PRN
  Administered 2021-03-26: 1 mg via INTRAVENOUS
  Administered 2021-03-26: 0.5 mg via INTRAVENOUS
  Administered 2021-03-27 – 2021-03-28 (×2): 1 mg via INTRAVENOUS
  Filled 2021-03-26 (×4): qty 1

## 2021-03-26 MED ORDER — SODIUM CHLORIDE 0.45 % IV SOLN: INTRAVENOUS | Status: DC

## 2021-03-26 MED ORDER — OXYCODONE HCL 5 MG PO TABS: 5.0000 mg | ORAL_TABLET | Freq: Once | ORAL | Status: DC | PRN

## 2021-03-26 MED ORDER — SODIUM CHLORIDE 0.9 % IV SOLN
INTRAVENOUS | Status: DC | PRN
  Administered 2021-03-26: 40 mL

## 2021-03-26 MED ORDER — AMISULPRIDE (ANTIEMETIC) 5 MG/2ML IV SOLN: 10.0000 mg | Freq: Once | INTRAVENOUS | Status: DC | PRN

## 2021-03-26 MED ORDER — FENTANYL CITRATE (PF) 250 MCG/5ML IJ SOLN
INTRAMUSCULAR | Status: DC | PRN
  Administered 2021-03-26 (×4): 50 ug via INTRAVENOUS
  Administered 2021-03-26: 150 ug via INTRAVENOUS

## 2021-03-26 MED ORDER — PHENYLEPHRINE 40 MCG/ML (10ML) SYRINGE FOR IV PUSH (FOR BLOOD PRESSURE SUPPORT)
PREFILLED_SYRINGE | INTRAVENOUS | Status: AC
  Filled 2021-03-26: qty 10

## 2021-03-26 MED ORDER — FENTANYL CITRATE (PF) 100 MCG/2ML IJ SOLN
INTRAMUSCULAR | Status: AC
  Filled 2021-03-26: qty 2

## 2021-03-26 MED ORDER — CHLORHEXIDINE GLUCONATE 0.12 % MT SOLN
15.0000 mL | Freq: Once | OROMUCOSAL | Status: AC
  Administered 2021-03-26: 15 mL via OROMUCOSAL

## 2021-03-26 MED ORDER — ROCURONIUM BROMIDE 10 MG/ML (PF) SYRINGE
PREFILLED_SYRINGE | INTRAVENOUS | Status: DC | PRN
  Administered 2021-03-26: 40 mg via INTRAVENOUS
  Administered 2021-03-26: 60 mg via INTRAVENOUS
  Administered 2021-03-26: 30 mg via INTRAVENOUS

## 2021-03-26 MED ORDER — CEFAZOLIN SODIUM-DEXTROSE 2-4 GM/100ML-% IV SOLN
2.0000 g | Freq: Once | INTRAVENOUS | Status: AC
  Administered 2021-03-26: 2 g via INTRAVENOUS
  Filled 2021-03-26: qty 100

## 2021-03-26 MED ORDER — HYDROCODONE-ACETAMINOPHEN 5-325 MG PO TABS: 1.0000 | ORAL_TABLET | Freq: Four times a day (QID) | ORAL | 0 refills | Status: DC | PRN

## 2021-03-26 MED ORDER — ONDANSETRON HCL 4 MG/2ML IJ SOLN
INTRAMUSCULAR | Status: AC
  Filled 2021-03-26: qty 2

## 2021-03-26 SURGICAL SUPPLY — 69 items
BAG COUNTER SPONGE SURGICOUNT (BAG) IMPLANT
BAG LAPAROSCOPIC 12 15 PORT 16 (BASKET) ×1 IMPLANT
BAG RETRIEVAL 12/15 (BASKET) ×2
CHLORAPREP W/TINT 26 (MISCELLANEOUS) ×3 IMPLANT
CLIP LIGATING HEM O LOK PURPLE (MISCELLANEOUS) ×2 IMPLANT
CLIP LIGATING HEMO LOK XL GOLD (MISCELLANEOUS) ×2 IMPLANT
CLIP LIGATING HEMO O LOK GREEN (MISCELLANEOUS) ×1 IMPLANT
COVER SURGICAL LIGHT HANDLE (MISCELLANEOUS) ×2 IMPLANT
COVER TIP SHEARS 8 DVNC (MISCELLANEOUS) ×1 IMPLANT
COVER TIP SHEARS 8MM DA VINCI (MISCELLANEOUS) ×1
CUTTER ECHEON FLEX ENDO 45 340 (ENDOMECHANICALS) ×1 IMPLANT
DERMABOND ADVANCED (GAUZE/BANDAGES/DRESSINGS) ×1
DERMABOND ADVANCED .7 DNX12 (GAUZE/BANDAGES/DRESSINGS) ×1 IMPLANT
DRAIN CHANNEL RND F F (WOUND CARE) ×1 IMPLANT
DRAPE ARM DVNC X/XI (DISPOSABLE) ×4 IMPLANT
DRAPE COLUMN DVNC XI (DISPOSABLE) ×1 IMPLANT
DRAPE DA VINCI XI ARM (DISPOSABLE) ×4
DRAPE DA VINCI XI COLUMN (DISPOSABLE) ×1
DRAPE INCISE IOBAN 66X45 STRL (DRAPES) ×2 IMPLANT
DRAPE SHEET LG 3/4 BI-LAMINATE (DRAPES) ×2 IMPLANT
ELECT PENCIL ROCKER SW 15FT (MISCELLANEOUS) ×2 IMPLANT
ELECT REM PT RETURN 15FT ADLT (MISCELLANEOUS) ×2 IMPLANT
EVACUATOR SILICONE 100CC (DRAIN) ×1 IMPLANT
GAUZE 4X4 16PLY ~~LOC~~+RFID DBL (SPONGE) ×1 IMPLANT
GLOVE SRG 8 PF TXTR STRL LF DI (GLOVE) ×1 IMPLANT
GLOVE SURG ENC MOIS LTX SZ6.5 (GLOVE) ×2 IMPLANT
GLOVE SURG ENC TEXT LTX SZ7.5 (GLOVE) ×4 IMPLANT
GLOVE SURG SYN 7.0 (GLOVE) ×4 IMPLANT
GLOVE SURG SYN 7.0 PF PI (GLOVE) IMPLANT
GLOVE SURG UNDER POLY LF SZ6 (GLOVE) ×2 IMPLANT
GLOVE SURG UNDER POLY LF SZ8 (GLOVE) ×1
GOWN STRL REUS W/TWL LRG LVL3 (GOWN DISPOSABLE) ×3 IMPLANT
GOWN STRL REUS W/TWL XL LVL3 (GOWN DISPOSABLE) ×4 IMPLANT
HEMOSTAT SURGICEL 4X8 (HEMOSTASIS) ×1 IMPLANT
HOLDER FOLEY CATH W/STRAP (MISCELLANEOUS) ×2 IMPLANT
IRRIG SUCT STRYKERFLOW 2 WTIP (MISCELLANEOUS) ×2
IRRIGATION SUCT STRKRFLW 2 WTP (MISCELLANEOUS) ×1 IMPLANT
IV LACTATED RINGERS 1000ML (IV SOLUTION) ×1 IMPLANT
KIT BASIN OR (CUSTOM PROCEDURE TRAY) ×2 IMPLANT
KIT TURNOVER KIT A (KITS) IMPLANT
MARKER SKIN DUAL TIP RULER LAB (MISCELLANEOUS) ×2 IMPLANT
NDL INSUFFLATION 14GA 120MM (NEEDLE) ×1 IMPLANT
NEEDLE INSUFFLATION 14GA 120MM (NEEDLE) ×2 IMPLANT
PROTECTOR NERVE ULNAR (MISCELLANEOUS) ×3 IMPLANT
RELOAD STAPLE 45 2.6 WHT THIN (STAPLE) IMPLANT
SCISSORS LAP 5X45 EPIX DISP (ENDOMECHANICALS) ×2 IMPLANT
SEAL CANN UNIV 5-8 DVNC XI (MISCELLANEOUS) ×3 IMPLANT
SEAL XI 5MM-8MM UNIVERSAL (MISCELLANEOUS) ×4
SET TUBE SMOKE EVAC HIGH FLOW (TUBING) ×2 IMPLANT
SOLUTION ELECTROLUBE (MISCELLANEOUS) ×2 IMPLANT
SPIKE FLUID TRANSFER (MISCELLANEOUS) ×2 IMPLANT
SPONGE T-LAP 18X18 ~~LOC~~+RFID (SPONGE) ×2 IMPLANT
STAPLE RELOAD 45 WHT (STAPLE) ×3 IMPLANT
STAPLE RELOAD 45MM WHITE (STAPLE) ×3
SUT ETHILON 3 0 PS 1 (SUTURE) ×1 IMPLANT
SUT MNCRL AB 4-0 PS2 18 (SUTURE) ×4 IMPLANT
SUT PDS AB 0 CT1 36 (SUTURE) ×4 IMPLANT
SUT VIC AB 2-0 SH 27 (SUTURE) ×1
SUT VIC AB 2-0 SH 27X BRD (SUTURE) IMPLANT
SUT VICRYL 0 UR6 27IN ABS (SUTURE) IMPLANT
TOWEL OR 17X26 10 PK STRL BLUE (TOWEL DISPOSABLE) ×2 IMPLANT
TOWEL OR NON WOVEN STRL DISP B (DISPOSABLE) ×2 IMPLANT
TRAY FOL W/BAG SLVR 16FR STRL (SET/KITS/TRAYS/PACK) IMPLANT
TRAY FOLEY MTR SLVR 16FR STAT (SET/KITS/TRAYS/PACK) ×1 IMPLANT
TRAY FOLEY W/BAG SLVR 16FR LF (SET/KITS/TRAYS/PACK) ×1
TRAY LAPAROSCOPIC (CUSTOM PROCEDURE TRAY) ×2 IMPLANT
TROCAR BLADELESS OPT 5 100 (ENDOMECHANICALS) ×1 IMPLANT
TROCAR XCEL 12X100 BLDLESS (ENDOMECHANICALS) ×2 IMPLANT
WATER STERILE IRR 1000ML POUR (IV SOLUTION) ×2 IMPLANT

## 2021-03-26 NOTE — Progress Notes (Signed)
Pt ambulated ~50 ft and tolerated fairly. Pt only able to tolerate small sips of water so refused PO medication. SCDs on and pt encouraged to move in bed.  ?

## 2021-03-26 NOTE — Anesthesia Procedure Notes (Signed)
Procedure Name: Intubation ?Date/Time: 03/26/2021 12:36 PM ?Performed by: Claudia Desanctis, CRNA ?Pre-anesthesia Checklist: Patient identified, Emergency Drugs available, Suction available and Patient being monitored ?Patient Re-evaluated:Patient Re-evaluated prior to induction ?Oxygen Delivery Method: Circle system utilized ?Preoxygenation: Pre-oxygenation with 100% oxygen ?Induction Type: IV induction ?Ventilation: Mask ventilation without difficulty ?Laryngoscope Size: 2 and Miller ?Grade View: Grade I ?Tube type: Oral ?Tube size: 7.0 mm ?Number of attempts: 1 ?Airway Equipment and Method: Stylet ?Placement Confirmation: ETT inserted through vocal cords under direct vision, positive ETCO2 and breath sounds checked- equal and bilateral ?Secured at: 21 cm ?Tube secured with: Tape ?Dental Injury: Teeth and Oropharynx as per pre-operative assessment  ? ? ? ? ?

## 2021-03-26 NOTE — Progress Notes (Signed)
Attempted to walk patient prior to end of shift but patient still sleeping and drowsy from recent pain medication.  Will address with night RN that patient is to ambulate tonight when more alert and steady.   ?

## 2021-03-26 NOTE — Transfer of Care (Signed)
Immediate Anesthesia Transfer of Care Note ? ?Patient: Mary Moyer ? ?Procedure(s) Performed: XI ROBOTIC ASSISTED LAPAROSCOPIC NEPHRECTOMY (Right: Renal) ? ?Patient Location: PACU ? ?Anesthesia Type:General ? ?Level of Consciousness: drowsy ? ?Airway & Oxygen Therapy: Patient Spontanous Breathing and Patient connected to face mask oxygen ? ?Post-op Assessment: Report given to RN and Post -op Vital signs reviewed and stable ? ?Post vital signs: Reviewed and stable ? ?Last Vitals:  ?Vitals Value Taken Time  ?BP 151/84 03/26/21 1542  ?Temp    ?Pulse 72 03/26/21 1544  ?Resp 20 03/26/21 1544  ?SpO2 98 % 03/26/21 1544  ?Vitals shown include unvalidated device data. ? ?Last Pain: There were no vitals filed for this visit.   ? ?  ? ?Complications: No notable events documented. ?

## 2021-03-26 NOTE — Anesthesia Postprocedure Evaluation (Signed)
Anesthesia Post Note ? ?Patient: Mary Moyer ? ?Procedure(s) Performed: XI ROBOTIC ASSISTED LAPAROSCOPIC NEPHRECTOMY (Right: Renal) ? ?  ? ?Patient location during evaluation: PACU ?Anesthesia Type: General ?Level of consciousness: awake ?Pain management: pain level controlled ?Vital Signs Assessment: post-procedure vital signs reviewed and stable ?Respiratory status: spontaneous breathing, nonlabored ventilation, respiratory function stable and patient connected to nasal cannula oxygen ?Cardiovascular status: blood pressure returned to baseline and stable ?Postop Assessment: no apparent nausea or vomiting ?Anesthetic complications: no ? ? ?No notable events documented. ? ?Last Vitals:  ?Vitals:  ? 03/26/21 1645 03/26/21 1724  ?BP: 129/74 (!) 147/79  ?Pulse: 70 71  ?Resp: 14 (!) 22  ?Temp:    ?SpO2: 91% 93%  ?  ?Last Pain:  ?Vitals:  ? 03/26/21 1812  ?PainSc: Asleep  ? ? ?  ?  ?  ?  ?  ?  ? ?Jossiah Smoak P Harmon Bommarito ? ? ? ? ?

## 2021-03-26 NOTE — Op Note (Signed)
Operative Note ? ?Preoperative diagnosis:  ?1.  8 cm right renal mass ?2.  13 cm hepatic cyst ? ?Postoperative diagnosis: ?1.  8 cm right renal mass ?2.  13 cm hepatic cyst ? ?Procedure(s): ?1.  Robot-assisted laparoscopic right nephrectomy (not adrenal sparing) ?2.  Excision of hepatic cyst ? ?Surgeon: Ellison Hughs, MD ? ?Assistants: Debbrah Alar, PA-C  An assistant was required for this surgical procedure.  The duties of the assistant included but were not limited to suctioning, passing suture, camera manipulation, retraction.  This procedure would not be able to be performed without an Environmental consultant.  ? ?Anesthesia:  General ? ?Complications: The patient's large hepatic cyst was inadvertently entered while dissecting around the upper pole the kidney.  Due to its large size and simple nature, the cyst wall was excised and sent for pathologic analysis ? ?EBL: 75 mL ? ?Specimens: ?1.  Right kidney and adrenal gland ?2.  Hepatic cyst wall ? ?Drains/Catheters: ?1.  Foley catheter ?2.  Right lower quadrant JP drain ? ?Intraoperative findings:   ?The right renal hilum was hemostatic following staple ligation ?Large hepatic cyst that prohibited excision of the upper pole of the right kidney and adrenal gland ? ?Indication:  Mary Moyer is a 51 y.o. female with a solid and enhancing right renal mass with features concerning for renal cell carcinoma.  The patient has been consented for the above procedures, voices understanding and wishes to proceed. ? ?Description of procedure: ? ?After informed consent was signed, the patient was taken back to the operating room and properly anesthetized.  The patient was then placed in the left lateral decubitus position with all pressure points padded.  The abdomen was then prepped and draped in the usual sterile fashion.  A time-out was then performed.   ?  ?An 8 mm incision was then made lateral to the right rectus muscle at the level of the right 12th rib.  A Veress needle was  then used to access the abdominal cavity.  A saline drop test showed no signs of obstruction and aspiration of the Veress needle revealed no blood or sucus.  The abdominal cavity was then insufflated to 15 mmHg.  An 8 mm robotic trocar was then atraumatically inserted into the abdominal cavity.  The robotic camera was then inserted through the port and inspection of the abdominal cavity revealed no evidence of adjacent organ or vessel injury. We then placed three additional 8 mm robotic ports, a 12 mm assistant port and a 5 mm subxiphoid port in such as fashion as to triangulate the right renal hilum.  The robot was then docked into postion. ?  ?Using a combination of blunt and cold scissors dissection, the hepatic attachments were released from the abdominal sidewall.  A locking grasper was then inserted through the 5 mm sub-xyphoid port and used to retract the posterior surface of the liver more cephalad.  The white line of Toldt along the ascending colon was then incised, allowing [place] to reflect the colon medially and expose the anterior surface of the right kidney.  The duodenum was then Kocherized medially, which abruptly led [place] to the identification of the inferior vena cava. ?   ?Once the colon was adequately mobilized, we moved to the lower pole and identified the gonadal vein and ureter.  The gonadal vein was then left running parallel to the vena cava and the right ureter was reflected anteriorly.  Using cautious cautery, the overlying perihilar attachments were then released.  This  yielded visualization of the renal hilum, which included a single right renal vein and a single right renal artery.  The perilymphatic tissue surrounding the right renal artery were carefully released so that the right renal artery was fully encircled. ?   ?A 45 mm powered endovascular stapler was then used to ligate the right renal artery and vein, individual the right hilar stump was hemostatic following staple ligation.   While dissecting around the upper pole of the kidney and adrenal gland, her large hepatic cyst was inadvertently entered, draining approximately 600 mL of cloudy fluid.  Due to the large cavity of the cyst, the decision was made to excise the majority of the cyst wall and fulgurate the anterior aspects of the remaining cyst to prevent fluid reaccumulation.  The endovascular stapler was then used to ligate the lower pole attachments and the proximal aspects of the right ureter.  The remaining perinephric attachments were then incised using electrocautery. Reinspection of the right retroperitoneal space revealed excellent hemostasis. Once the right kidney was fully mobile, it was placed in an Endo Catch bag and left in the abdominal cavity. ?  ?The 12 mm midline assistant port was then closed using the [employer] technique with a 0 Vicryl suture.  A right lower quadrant Gibson incision was then made in the right kidney was removed within the Endo Catch bag.  The fascia of the external and internal oblique were then closed with a running 0 PDS suture.  The skin incisions were then closed using 4-0 Monocryl.  Dermabond was applied to all skin incisions. ? ?Plan: Monitor on the floor overnight ? ?

## 2021-03-26 NOTE — Anesthesia Preprocedure Evaluation (Addendum)
Anesthesia Evaluation  ?Patient identified by MRN, date of birth, ID band ?Patient awake ? ? ? ?Reviewed: ?Allergy & Precautions, NPO status , Patient's Chart, lab work & pertinent test results ? ?Airway ?Mallampati: I ? ?TM Distance: >3 FB ?Neck ROM: Full ? ? ? Dental ?no notable dental hx. ? ?  ?Pulmonary ?neg pulmonary ROS,  ?  ?Pulmonary exam normal ?breath sounds clear to auscultation ? ? ? ? ? ? Cardiovascular ?hypertension, Pt. on medications ?Normal cardiovascular exam ?Rhythm:Regular Rate:Normal ? ? ?  ?Neuro/Psych ? Headaches, PSYCHIATRIC DISORDERS Anxiety Depression   ? GI/Hepatic ?negative GI ROS, Neg liver ROS,   ?Endo/Other  ?diabetes, Oral Hypoglycemic AgentsHypothyroidism Morbid obesity ? Renal/GU ?Renal disease  ? ?  ?Musculoskeletal ? ?(+) Arthritis ,  ? Abdominal ?(+) + obese,   ?Peds ? Hematology ?negative hematology ROS ?(+)   ?Anesthesia Other Findings ?RIGHT RENAL MASS ? Reproductive/Obstetrics ?hcg negative ? ?  ? ? ? ? ? ? ? ? ? ? ? ? ? ?  ?  ? ? ? ? ? ? ? ?Anesthesia Physical ?Anesthesia Plan ? ?ASA: 3 ? ?Anesthesia Plan: General  ? ?Post-op Pain Management:   ? ?Induction: Intravenous ? ?PONV Risk Score and Plan: 4 or greater and Ondansetron, Dexamethasone, Midazolam, Scopolamine patch - Pre-op and Treatment may vary due to age or medical condition ? ?Airway Management Planned: Oral ETT ? ?Additional Equipment:  ? ?Intra-op Plan:  ? ?Post-operative Plan: Extubation in OR ? ?Informed Consent: I have reviewed the patients History and Physical, chart, labs and discussed the procedure including the risks, benefits and alternatives for the proposed anesthesia with the patient or authorized representative who has indicated his/her understanding and acceptance.  ? ? ? ?Dental advisory given ? ?Plan Discussed with: CRNA ? ?Anesthesia Plan Comments:   ? ? ? ? ? ?Anesthesia Quick Evaluation ? ?

## 2021-03-26 NOTE — Discharge Instructions (Signed)

## 2021-03-26 NOTE — H&P (Signed)
PRE-OP H&P ? ?Office Visit Report     03/11/2021  ? ?-------------------------------------------------------------------------------- ?  ?Mary Moyer  ?MRN: [id number]  ?DOB: August 07, 1970, 51 year old Female  ?SSN:   ? PRIMARY CARE:  Wilfred Lacy, NP  ?REFERRING:  Arlester Marker, MD  ?PROVIDER:  Ellison Hughs, M.D.  ?TREATING:  Mcarthur Rossetti, PA  ?LOCATION:  [facility] [phone]  ?  ? ?-------------------------------------------------------------------------------- ?  ?CC/HPI: Pt presents today for pre-operative history and physical exam in anticipation of right robotic assisted lap radical nephrectomy by Dr. [name] on 03/26/21. Pt was previously scheduled for this procedure, however, it was cancelled due to her elevated HgA1c. Since that time she has established care with an endocrinologist and has improved with the most recent A1c of 6.7 on 03/06/21. She is doing well and is without complaint.  ? ?Pt denies F/C, HA, CP, SOB, N/V, diarrhea/constipation, back pain, flank pain, hematuria, and dysuria.  ? ? ? ?HX:  ? ?Pt presents today for pre-operative history and physical exam in anticipation of right robotic assisted lap radical nephrectomy on 11/08/20 by Dr. [name]. She is doing well and is without complaint. She was dropped by her PCP because she did not want to go to an endocrinologist for eval of her DM. Hg A1C last week was 11. Pt states she will see an endocrinologist but wants to establish after her kidney surgery due to finances. She was taking metformin and glipizide but stopped the glipizide when it ran out last time.  ? ?Pt denies F/C, HA, CP, SOB, vomiting, diarrhea/constipation, flank pain, hematuria, and dysuria. She has been having some mild nausea over the past week or so but relates this to stress/anxiety.  ? ? ? ?HX:  ? ?Renal mass  ? ?Ms. Bellin is a 59 female with a solid and enhancing renal mass measuring 7.7 x 7.5 x 6.1 cm involving the upper pole of the right  kidney. The mass was initially discovered on abdominal US during an evaluation of elevated LFTs and further characterized via MRI in July 2022. No renal vein involvement noted on MRI.  ? ?-PMHx of DM2, HTN, fatty liver and obesity.  ?-Personal/family history of GU malignancies: denies  ?-Smoking history: denies  ?-Prior abdominal surgeries: denies  ?-Renal function: eGFR >60  ?-History of kidney stones: denies  ? ?  ?ALLERGIES: Amlodipine - headache ?Latex - Redness, Skin Rash ?  ? ?MEDICATIONS: Lisinopril 10 mg tablet  ?Metformin Hcl  ?Ibuprofen 600 mg tablet  ?Levothyroxine 175 mcg capsule  ?  ? Notes: Metformin 896m BID  ?Rebelsys 7 mg QD  ?Glipizide 569mQHS  ? ?GU PSH: None  ?   ?PSH Notes: uterine ablasion  ? ?NON-GU PSH: None  ? ?GU PMH: Right renal neoplasm - 10/29/2020, - 09/02/2020 ?  ?   ?PMH Notes: kidney cancer  ?hypothyroidism  ?liver disease  ? ?NON-GU PMH: Anxiety ?Arthritis ?Depression ?Diabetes Type 2 ?Hypertension ?  ? ?FAMILY HISTORY: None  ? ?SOCIAL HISTORY: Marital Status: Divorced ?Preferred Language: En[place]nicity: Not Hispanic Or Latino; Race: White ?Current Smoking Status: Patient has never smoked.  ? ?Tobacco Use Assessment Completed: Used Tobacco in last 30 days? ?Has never drank.  ?Does not use drugs. ?Drinks 2 caffeinated drinks per day. ?Has not had a blood transfusion. ?  ? ?REVIEW OF SYSTEMS:    ?GU Review Female:   Patient denies frequent urination, hard to postpone urination, burning /pain with urination, get up at night to urinate, leakage of  urine, stream starts and stops, trouble starting your stream, have to strain to urinate, and being pregnant.  ?Gastrointestinal (Upper):   Patient denies nausea, vomiting, and indigestion/ heartburn.  ?Gastrointestinal (Lower):   Patient denies constipation and diarrhea.  ?Constitutional:   Patient denies fever, night sweats, weight loss, and fatigue.  ?Skin:   Patient denies skin rash/ lesion and itching.  ?Eyes:   Patient denies blurred  vision and double vision.  ?Ears/ Nose/ Throat:   Patient denies sore throat and sinus problems.  ?Hematologic/Lymphatic:   Patient denies swollen glands and easy bruising.  ?Cardiovascular:   Patient denies leg swelling and chest pains.  ?Respiratory:   Patient denies cough and shortness of breath.  ?Endocrine:   Patient denies excessive thirst.  ?Musculoskeletal:   Patient denies back pain and joint pain.  ?Neurological:   Patient denies headaches and dizziness.  ?Psychologic:   Patient denies depression and anxiety.  ? ?VITAL SIGNS:    ?  03/11/2021 02:39 PM  ?Weight 304 lb / 137.89 kg  ?Height 67 in / 170.18 cm  ?BP 117/78 mmHg  ?Pulse 90 /min  ?Temperature 98.0 F / 36.6 C  ?BMI 47.6 kg/m?  ? ?MULTI-SYSTEM PHYSICAL EXAMINATION:    ?Constitutional: Well-nourished. No physical deformities. Normally developed. Good grooming.  ?Neck: Neck symmetrical, not swollen. Normal tracheal position.  ?Respiratory: Normal breath sounds. No labored breathing, no use of accessory muscles.   ?Cardiovascular: Regular rate and rhythm. No murmur, no gallop.  ?Lymphatic: No enlargement of neck, axillae, groin.  ?Skin: No paleness, no jaundice, no cyanosis. No lesion, no ulcer, no rash.  ?Neurologic / Psychiatric: Oriented to time, oriented to place, oriented to person. No depression, no anxiety, no agitation.  ?Gastrointestinal: No mass, no tenderness, no rigidity, non obese abdomen.  ?Eyes: Normal conjunctivae. Normal eyelids.  ?Ears, Nose, Mouth, and Throat: Left ear no scars, no lesions, no masses. Right ear no scars, no lesions, no masses. Nose no scars, no lesions, no masses. Normal hearing. Normal lips.  ?Musculoskeletal: Normal gait and station of head and neck.  ? ?  ?Complexity of Data:  ?Records Review:   Previous Patient Records  ?Urine Test Review:   Urinalysis  ? 03/11/21  ?Urinalysis  ?Urine Appearance Clear   ?Urine Color Yellow   ?Urine Glucose Neg mg/dL  ?Urine Bilirubin Neg mg/dL  ?Urine Ketones Neg mg/dL  ?Urine  Specific Gravity 1.025   ?Urine Blood Neg ery/uL  ?Urine pH <=5.0   ?Urine Protein Neg mg/dL  ?Urine Urobilinogen 0.2 mg/dL  ?Urine Nitrites Neg   ?Urine Leukocyte Esterase Neg leu/uL  ? ?PROCEDURES:    ? ?     Urinalysis - 81003 ?Dipstick Dipstick Cont'd  ?Color: Yellow Bilirubin: Neg mg/dL  ?Appearance: Clear Ketones: Neg mg/dL  ?Specific Gravity: 1.025 Blood: Neg ery/uL  ?pH: <=5.0 Protein: Neg mg/dL  ?Glucose: Neg mg/dL Urobilinogen: 0.2 mg/dL  ?  Nitrites: Neg  ?  Leukocyte Esterase: Neg leu/uL  ? ? ?ASSESSMENT:  ?    ICD-10 Details  ?1 GU:   Right renal neoplasm - D49.511   ? ?PLAN:    ? ?      Schedule ?Return Visit/Planned Activity: Keep Scheduled Appointment - Schedule Surgery  ? ? ?      Document ?Letter(s):  Created for Patient: Clinical Summary  ? ? ?     Notes: CLINICAL DATA: Right renal mass, staging  ? ?* Tracking Code: BO *  ? ?EXAM:  ?CT CHEST AND ABDOMEN WITHOUT AND  WITH CONTRAST  ? ?TECHNIQUE:  ?Multidetector CT imaging of the chest and abdomen was performed  ?without intravenous contrast. Multidetector CT imaging of the chest  ?and abdomen was then performed during bolus administration of  ?intravenous contrast.  ? ?RADIATION DOSE REDUCTION: This exam was performed according to the  ?departmental dose-optimization program which includes automated  ?exposure control, adjustment of the mA and/or kV according to  ?patient size and/or use of iterative reconstruction technique.  ? ?CONTRAST: 125 cc of Omnipaque 300  ? ?COMPARISON: MRI July 14, 2020.  ? ?FINDINGS:  ?CT CHEST FINDINGS  ? ?Cardiovascular: No significant vascular findings. Normal heart size.  ?No pericardial effusion.  ? ?Mediastinum/Nodes: No enlarged mediastinal or axillary lymph nodes.  ?Thyroid gland, trachea, and esophagus demonstrate no significant  ?findings.  ? ?Lungs/Pleura: No suspicious pulmonary nodules or masses. No focal  ?airspace consolidation. No visible pleural effusion or pneumothorax.  ? ?Musculoskeletal: Thoracic  spondylosis. No aggressive lytic or  ?blastic lesion of bone.  ? ?CT ABDOMEN FINDINGS  ? ?Hepatobiliary: Hepatic steatosis. 13.2 cm cyst in the right lobe of  ?the liver. Gallbladder is unremarkable. No biliar

## 2021-03-27 ENCOUNTER — Encounter (HOSPITAL_COMMUNITY): Payer: Self-pay | Admitting: Urology

## 2021-03-27 LAB — HEMOGLOBIN AND HEMATOCRIT, BLOOD
HCT: 36 % (ref 36.0–46.0)
Hemoglobin: 11.5 g/dL — ABNORMAL LOW (ref 12.0–15.0)

## 2021-03-27 LAB — BASIC METABOLIC PANEL
Anion gap: 8 (ref 5–15)
BUN: 18 mg/dL (ref 6–20)
CO2: 24 mmol/L (ref 22–32)
Calcium: 8.8 mg/dL — ABNORMAL LOW (ref 8.9–10.3)
Chloride: 101 mmol/L (ref 98–111)
Creatinine, Ser: 1.07 mg/dL — ABNORMAL HIGH (ref 0.44–1.00)
GFR, Estimated: >60 mL/min (ref >60)
Glucose, Bld: 152 mg/dL — ABNORMAL HIGH (ref 70–99)
Potassium: 4.1 mmol/L (ref 3.5–5.1)
Sodium: 133 mmol/L — ABNORMAL LOW (ref 135–145)

## 2021-03-27 LAB — GLUCOSE, CAPILLARY
Glucose-Capillary: 150 mg/dL — ABNORMAL HIGH (ref 70–99)
Glucose-Capillary: 135 mg/dL — ABNORMAL HIGH (ref 70–99)
Glucose-Capillary: 152 mg/dL — ABNORMAL HIGH (ref 70–99)
Glucose-Capillary: 169 mg/dL — ABNORMAL HIGH (ref 70–99)

## 2021-03-27 NOTE — Progress Notes (Addendum)
1 Day Post-Op ?Subjective: ?Patient reports nausea last night and this am but it is improving. Very sore.  O2 sat dropped to 88% last night while sleeping with [place] off. O2 restarted and sats responded appropriately. Minimal flatus. Amb 50 ft last night.  Rare use of IS. Good UO.  ~160cc bloody drainage from JP. Hg stable.  Rise in Cr expected.  ? ?Objective: ?Vital signs in last 24 hours: ?Temp:  [97.8 ?F (36.6 ?C)-98.4 ?F (36.9 ?C)] 98.4 ?F (36.9 ?C) (03/23 0542) ?Pulse Rate:  [59-83] 69 (03/23 0542) ?Resp:  [12-22] 16 (03/23 0542) ?BP: (110-171)/(53-106) 121/53 (03/23 0542) ?SpO2:  [91 %-98 %] 94 % (03/23 0542) ?Weight:  [277 kg] 139 kg (03/22 1823) ? ?Intake/Output from previous day: ?03/22 0701 - 03/23 0700 ?In: 2570.3 [I.V.:2420.3; IV Piggyback:150] ?Out: 1905 [Urine:1000; Drains:180; Blood:75] ?Intake/Output this shift: ?No intake/output data recorded. ? ?Physical Exam:  ?General:alert and cooperative; mild distress from soreness ?Cardiovascular: RRR ?Lungs: decreased BS bilateral bases ?GI: soft; appropriately tender; faint BS; ND ?Incisions: ecchymosis surrounding gibson otherwise C/D/I ?JP with bloody drainage ?Urine: clear ?Extremities: SCDs in place ? ?Lab Results: ?Recent Labs  ?  03/26/21 ?1739 03/27/21 ?0429  ?HGB 12.9 11.5*  ?HCT 40.5 36.0  ? ?BMET ?Recent Labs  ?  03/27/21 ?0429  ?NA 133*  ?K 4.1  ?CL 101  ?CO2 24  ?GLUCOSE 152*  ?BUN 18  ?CREATININE 1.07*  ?CALCIUM 8.8*  ? ?No results for input(s): LABPT, INR in the last 72 hours. ?No results for input(s): LABURIN in the last 72 hours. ? ? ?Studies/Results: ?No results found. ? ?Assessment/Plan: ?1 Day Post-Op, Procedure(s) (LRB): ?XI ROBOTIC ASSISTED LAPAROSCOPIC NEPHRECTOMY (Right) ? ?Ambulate, Incentive spirometry ?DVT prophylaxis ?Transition to PO pain medications ?SL IVF ?D/C foley with TOV ?Repeat Cr in am ?Continue clears until nausea resolved ?D/c JP ?Plan for d/c home tomorrow ? ? ? LOS: 1 day  ? ?Debbrah Alar ?03/27/2021, 7:33 AM ? ? ? ? ?

## 2021-03-27 NOTE — TOC Initial Note (Signed)
Transition of Care (TOC) - Initial/Assessment Note  ? ? ?Patient Details  ?Name: Mary Moyer ?MRN: [id number] ?Date of Birth: 08/08/1970 ? ?Transition of Care (TOC) CM/SW Contact:    ?Tawanna Cooler, RN ?Phone Number: ?03/27/2021, 9:01 AM ? ?Clinical Narrative:                 ? ?Transition of Care Department [facility]) has reviewed patient and no TOC needs have been identified at this time. We will continue to monitor patient advancement through interdisciplinary progression rounds. If new patient transition needs arise, please place a TOC consult. ?  ?Expected Discharge Plan: Home/Self Care ?Barriers to Discharge: Continued Medical Work up ? ? ?Patient Goals and CMS Choice ?Patient states their goals for this hospitalization and ongoing recovery are:: return home ?  ? ?Expected Discharge Plan and Services ?Expected Discharge Plan: Home/Self Care ?  ?  ?  ?Living arrangements for the past 2 months: [place] ?                ? Prior Living Arrangements/Services ?Living arrangements for the past 2 months: [place] ?Lives with:: Significant Other ?Patient language and need for interpreter reviewed:: Yes ?Do you feel safe going back to the place where you live?: Yes      ?Need for Family Participation in Patient Care: Yes (Comment) ?Care giver support system in place?: Yes (comment) ?  ?Criminal Activity/Legal Involvement Pertinent to Current Situation/Hospitalization: No - Comment as needed ? ?Activities of Daily Living ?Home Assistive Devices/Equipment: None ?ADL Screening (condition at time of admission) ?Patient's cognitive ability adequate to safely complete daily activities?: Yes ?Is the patient deaf or have difficulty hearing?: No ?Does the patient have difficulty seeing, even when wearing glasses/contacts?: No ?Does the patient have difficulty concentrating, remembering, or making decisions?: No ?Patient able to express need for assistance with ADLs?: Yes ?Does the patient have difficulty  dressing or bathing?: No ?Independently performs ADLs?: Yes (appropriate for developmental age) ?Does the patient have difficulty walking or climbing stairs?: No ?Weakness of Legs: None ?Weakness of Arms/Hands: None ? ?  ?Orientation: : Oriented to Self, Oriented to Place, Oriented to  Time, Oriented to Situation ?Alcohol / Substance Use: Not Applicable ?Psych Involvement: No (comment) ? ?Admission diagnosis:  Renal mass [N28.89] ?Patient Active Problem List  ? Diagnosis Date Noted  ? Renal mass 03/26/2021  ? Hyperlipidemia due to type 2 diabetes mellitus ([place]) 02/24/2021  ? Type 2 diabetes mellitus with hyperglycemia, without long-term current use of insulin ([place]) 02/24/2021  ? Insomnia due to medical condition 02/24/2021  ? Vitamin D deficiency 12/17/2020  ? Acquired hypothyroidism 12/16/2020  ? Renal mass, right 06/27/2020  ? Elevated LFTs 07/31/2019  ? Hx of endometriosis 06/03/2018  ? Iron deficiency anemia due to chronic blood loss 05/06/2018  ? Depression, major, single episode, severe ([place]) 03/30/2018  ? Diuretic-induced hypokalemia 03/30/2018  ? GAD (generalized anxiety disorder) 03/30/2018  ? Essential hypertension 03/22/2018  ? Morbid obesity ([place]) 03/22/2018  ? History of PCOS 03/22/2018  ? ?PCP:  Jeanie Sewer, NP ?Pharmacy:   ?[id number] [place] [place] [facility] [place] ?[address] ?[place] [address] ?Phone: [phone] Fax: [phone] ? ?Readmission Risk Interventions ?   ? View : No data to display.  ?  ?  ?  ? ? ? ?

## 2021-03-27 NOTE — Plan of Care (Signed)
  Problem: Health Behavior/Discharge Planning: Goal: Ability to manage health-related needs will improve Outcome: Progressing   Problem: Clinical Measurements: Goal: Diagnostic test results will improve Outcome: Progressing   Problem: Activity: Goal: Risk for activity intolerance will decrease Outcome: Progressing   Problem: Nutrition: Goal: Adequate nutrition will be maintained Outcome: Progressing   Problem: Coping: Goal: Level of anxiety will decrease Outcome: Progressing   

## 2021-03-28 LAB — BASIC METABOLIC PANEL
Anion gap: 6 (ref 5–15)
BUN: 16 mg/dL (ref 6–20)
CO2: 25 mmol/L (ref 22–32)
Calcium: 8.9 mg/dL (ref 8.9–10.3)
Chloride: 103 mmol/L (ref 98–111)
Creatinine, Ser: 1.08 mg/dL — ABNORMAL HIGH (ref 0.44–1.00)
GFR, Estimated: >60 mL/min (ref >60)
Glucose, Bld: 161 mg/dL — ABNORMAL HIGH (ref 70–99)
Potassium: 4.4 mmol/L (ref 3.5–5.1)
Sodium: 134 mmol/L — ABNORMAL LOW (ref 135–145)

## 2021-03-28 LAB — GLUCOSE, CAPILLARY
Glucose-Capillary: 166 mg/dL — ABNORMAL HIGH (ref 70–99)
Glucose-Capillary: 130 mg/dL — ABNORMAL HIGH (ref 70–99)
Glucose-Capillary: 169 mg/dL — ABNORMAL HIGH (ref 70–99)

## 2021-03-28 MED ORDER — MENTHOL 3 MG MT LOZG
1.0000 | LOZENGE | OROMUCOSAL | Status: DC | PRN
  Filled 2021-03-28: qty 9

## 2021-03-28 MED ORDER — BISACODYL 10 MG RE SUPP
10.0000 mg | Freq: Once | RECTAL | Status: AC
  Administered 2021-03-28: 10 mg via RECTAL
  Filled 2021-03-28: qty 1

## 2021-03-28 MED ORDER — ACETAMINOPHEN 325 MG PO TABS
650.0000 mg | ORAL_TABLET | ORAL | Status: DC | PRN
  Administered 2021-03-28 (×2): 650 mg via ORAL
  Filled 2021-03-28 (×2): qty 2

## 2021-03-28 NOTE — Plan of Care (Signed)
?  Problem: Health Behavior/Discharge Planning: ?Goal: Ability to manage health-related needs will improve ?Outcome: Progressing ?  ?Problem: Clinical Measurements: ?Goal: Ability to maintain clinical measurements within normal limits will improve ?Outcome: Progressing ?  ?Problem: Activity: ?Goal: Risk for activity intolerance will decrease ?Outcome: Progressing ?  ?Problem: Pain Managment: ?Goal: General experience of comfort will improve ?Outcome: Progressing ?  ?Problem: Clinical Measurements: ?Goal: Postoperative complications will be avoided or minimized ?Outcome: Progressing ?  ?

## 2021-03-28 NOTE — Progress Notes (Signed)
AVS and discharge instructions reviewed w/ patient and sister at the bedside. Patient and sister verbalized understanding and had no further questions. ?

## 2021-03-28 NOTE — Progress Notes (Signed)
2 Days Post-Op ?Subjective: ?Patient reports feeling better.  Increased soreness after walking a lot yesterday.  Denies N/V/Flatus/BM.  Good UO. Using IS. ? ?Objective: ?Vital signs in last 24 hours: ?Temp:  [97.7 ?F (36.5 ?C)-98.1 ?F (36.7 ?C)] 98 ?F (36.7 ?C) (03/24 5400) ?Pulse Rate:  [67-89] 68 (03/24 0615) ?Resp:  [16-20] 16 (03/24 0615) ?BP: (107-145)/(49-77) 107/68 (03/24 0615) ?SpO2:  [91 %-97 %] 91 % (03/24 0615) ? ?Intake/Output from previous day: ?03/23 0701 - 03/24 0700 ?In: 360 [P.O.:360] ?Out: 60 [Drains:70] ?Intake/Output this shift: ?No intake/output data recorded. ? ?Physical Exam:  ?General:alert, cooperative, and no distress ?GI: soft NT ND +BS ?Lungs: faint crackles bases ?Incisions: C/D/I; no change ecchymosis at giboson ? ?Lab Results: ?Recent Labs  ?  03/26/21 ?1739 03/27/21 ?0429  ?HGB 12.9 11.5*  ?HCT 40.5 36.0  ? ?BMET ?Recent Labs  ?  03/27/21 ?0429 03/28/21 ?0411  ?NA 133* 134*  ?K 4.1 4.4  ?CL 101 103  ?CO2 24 25  ?GLUCOSE 152* 161*  ?BUN 18 16  ?CREATININE 1.07* 1.08*  ?CALCIUM 8.8* 8.9  ? ?No results for input(s): LABPT, INR in the last 72 hours. ?No results for input(s): LABURIN in the last 72 hours. ? ? ?Studies/Results: ?No results found. ? ?Assessment/Plan: ?2 Days Post-Op Procedure(s) (LRB): ?XI ROBOTIC ASSISTED LAPAROSCOPIC NEPHRECTOMY (Right) ?Doing well ?IS ?Dulcolax supp ?Amb ?Cr stable ?D/c home today  ? ? ? LOS: 2 days  ? ?Debbrah Alar ?03/28/2021, 7:36 AM ? ? ? ?

## 2021-03-28 NOTE — Discharge Summary (Signed)
?Date of admission: 03/26/2021 ? ?Date of discharge: 03/28/2021 ? ?Admission diagnosis: Right renal mass ? ?Discharge diagnosis: same ? ?Secondary diagnoses: DM, HTN, fatty liver, obesity, anxiety, depression ? ?History and Physical: For full details, please see admission history and physical. Briefly, Mary Moyer is a 51 y.o. year old patient with a solid and enhancing renal mass measuring 7.7 x 7.5 x 6.1 cm involving the upper pole of the right kidney. The mass was initially discovered on abdominal US during an evaluation of elevated LFTs and further characterized via MRI in July 2022. No renal vein involvement noted on MRI.  ? ?Hospital Course: Pt was admitted and taken to the OR on 03/26/21 for a robotic assisted lap radical right nephrectomy.  Pt tolerated the procedure well and was hemodynamically stable throughout. She was extubated without complication and woke up from anesthesia neurologically intact. She was transferred from the OR to PACU and then to the floor without issue. Post op course progressed as expected.  Foley was removed on POD 1 and she was able to void. By POD 2 she was ambulating and tolerating a regular diet. She had minimal flatus and no BM. Labs and vitals remained stable. On POD 2 she had met all discharge criteria and was felt to be stable for d/c home.  ? ?Laboratory values:  ?Recent Labs  ?  03/26/21 ?1739 03/27/21 ?0429  ?HGB 12.9 11.5*  ?HCT 40.5 36.0  ? ?Recent Labs  ?  03/27/21 ?0429 03/28/21 ?0411  ?CREATININE 1.07* 1.08*  ? ? ?Disposition: Home ? ?Discharge instruction: The patient was instructed to be ambulatory but told to refrain from heavy lifting, strenuous activity, or driving.  ? ?Discharge medications:  ?Allergies as of 03/28/2021   ? ?   Reactions  ? Amlodipine Other (See Comments)  ? Headache with $RemoveBefor'5mg'[id number]$   ? Orange Fruit [citrus]   ? migrains--headache  ? Latex Rash  ? ?  ? ?  ?Medication List  ?  ? ?STOP taking these medications   ? ?cholecalciferol 25 MCG (1000 UNIT)  tablet ?Commonly known as: VITAMIN D3 ?  ? ?  ? ?TAKE these medications   ? ?atorvastatin 10 MG tablet ?Commonly known as: LIPITOR ?Take 1 tablet (10 mg total) by mouth daily. ?  ?blood glucose meter kit and supplies Kit ?Dispense based on patient and insurance preference. Use once daily before breakfast. E11.65 ?  ?docusate sodium 100 MG capsule ?Commonly known as: COLACE ?Take 1 capsule (100 mg total) by mouth 2 (two) times daily. ?  ?glipiZIDE 5 MG 24 hr tablet ?Commonly known as: GLUCOTROL XL ?Take 1 tablet (5 mg total) by mouth daily. ?What changed: when to take this ?  ?HYDROcodone-acetaminophen 5-325 MG tablet ?Commonly known as: Norco ?Take 1-2 tablets by mouth every 6 (six) hours as needed for moderate pain. ?  ?levothyroxine 200 MCG tablet ?Commonly known as: SYNTHROID ?Take 1 tablet (200 mcg total) by mouth as directed. Schedule appt with endocrinology for additional refills ?What changed:  ?how much to take ?additional instructions ?  ?lisinopril 10 MG tablet ?Commonly known as: ZESTRIL ?Take 1 tablet (10 mg total) by mouth daily. ?  ?metFORMIN 850 MG tablet ?Commonly known as: GLUCOPHAGE ?Take 1 tablet (850 mg total) by mouth 2 (two) times daily with a meal. ?  ?OneTouch Verio test strip ?Generic drug: glucose blood ?1 each by Other route in the morning and at bedtime. Use as instructed ?  ?promethazine 12.5 MG tablet ?Commonly known as: PHENERGAN ?Take 1  tablet (12.5 mg total) by mouth every 4 (four) hours as needed for nausea or vomiting. ?  ?Rybelsus 7 MG Tabs ?Generic drug: Semaglutide ?Take 7 mg by mouth daily. ?  ? ?  ? ? ?Followup:  ? Follow-up Information   ? ? Ceasar Mons, MD Follow up on 04/10/2021.   ?Specialty: Urology ?Why: at 8:30 ?Contact information: ?[address] ?[address] ?[place] [address] ?[phone] ? ? ?  ?  ? ?  ?  ? ?  ?  ?

## 2021-03-29 ENCOUNTER — Encounter: Payer: Self-pay | Admitting: Internal Medicine

## 2021-04-02 LAB — SURGICAL PATHOLOGY

## 2021-04-08 ENCOUNTER — Other Ambulatory Visit: Payer: Self-pay

## 2021-04-08 DIAGNOSIS — I10 Essential (primary) hypertension: Secondary | ICD-10-CM

## 2021-04-08 MED ORDER — LISINOPRIL 10 MG PO TABS: 10.0000 mg | ORAL_TABLET | Freq: Every day | ORAL | 3 refills | Status: DC

## 2021-04-10 DIAGNOSIS — C641 Malignant neoplasm of right kidney, except renal pelvis: Secondary | ICD-10-CM | POA: Diagnosis not present

## 2021-05-05 DIAGNOSIS — C641 Malignant neoplasm of right kidney, except renal pelvis: Secondary | ICD-10-CM | POA: Diagnosis not present

## 2021-06-20 DIAGNOSIS — E119 Type 2 diabetes mellitus without complications: Secondary | ICD-10-CM | POA: Diagnosis not present

## 2021-07-15 ENCOUNTER — Ambulatory Visit (INDEPENDENT_AMBULATORY_CARE_PROVIDER_SITE_OTHER): Payer: BC Managed Care – PPO | Admitting: Internal Medicine

## 2021-07-15 ENCOUNTER — Encounter: Payer: Self-pay | Admitting: Internal Medicine

## 2021-07-15 VITALS — BP 118/76 | HR 70 | Ht 67.0 in | Wt 305.0 lb

## 2021-07-15 DIAGNOSIS — R5383 Other fatigue: Secondary | ICD-10-CM | POA: Diagnosis not present

## 2021-07-15 DIAGNOSIS — E785 Hyperlipidemia, unspecified: Secondary | ICD-10-CM | POA: Diagnosis not present

## 2021-07-15 DIAGNOSIS — E039 Hypothyroidism, unspecified: Secondary | ICD-10-CM

## 2021-07-15 DIAGNOSIS — Z794 Long term (current) use of insulin: Secondary | ICD-10-CM

## 2021-07-15 DIAGNOSIS — E1165 Type 2 diabetes mellitus with hyperglycemia: Secondary | ICD-10-CM | POA: Diagnosis not present

## 2021-07-15 LAB — CBC
HCT: 36.5 % (ref 36.0–46.0)
Hemoglobin: 11.9 g/dL — ABNORMAL LOW (ref 12.0–15.0)
MCHC: 32.4 g/dL (ref 30.0–36.0)
MCV: 87 fl (ref 78.0–100.0)
Platelets: 242 10*3/uL (ref 150.0–400.0)
RBC: 4.2 Mil/uL (ref 3.87–5.11)
RDW: 14.6 % (ref 11.5–15.5)
WBC: 7.8 10*3/uL (ref 4.0–10.5)

## 2021-07-15 LAB — COMPREHENSIVE METABOLIC PANEL
ALT: 32 U/L (ref 0–35)
AST: 23 U/L (ref 0–37)
Albumin: 4.2 g/dL (ref 3.5–5.2)
Alkaline Phosphatase: 62 U/L (ref 39–117)
BUN: 21 mg/dL (ref 6–23)
CO2: 24 mEq/L (ref 19–32)
Calcium: 9.6 mg/dL (ref 8.4–10.5)
Chloride: 104 mEq/L (ref 96–112)
Creatinine, Ser: 0.98 mg/dL (ref 0.40–1.20)
GFR: 67.7 mL/min (ref >60.00)
Glucose, Bld: 81 mg/dL (ref 70–99)
Potassium: 4.2 mEq/L (ref 3.5–5.1)
Sodium: 137 mEq/L (ref 135–145)
Total Bilirubin: 0.2 mg/dL (ref 0.2–1.2)
Total Protein: 7.2 g/dL (ref 6.0–8.3)

## 2021-07-15 LAB — POCT GLYCOSYLATED HEMOGLOBIN (HGB A1C): Hemoglobin A1C: 6.1 % — AB (ref 4.0–5.6)

## 2021-07-15 LAB — VITAMIN D 25 HYDROXY (VIT D DEFICIENCY, FRACTURES): VITD: 22.97 ng/mL — ABNORMAL LOW (ref 30.00–100.00)

## 2021-07-15 LAB — VITAMIN B12: Vitamin B-12: 302 pg/mL (ref 211–911)

## 2021-07-15 LAB — TSH: TSH: 0.41 u[IU]/mL (ref 0.35–5.50)

## 2021-07-15 NOTE — Patient Instructions (Addendum)
-   STOP Glipizide  - Continue Rybelsus 7 mg , 1 tablet Before Breakfast - Continue Metformin 850 mg , 1 tablet twice a day     HOW TO TREAT LOW BLOOD SUGARS (Blood sugar LESS THAN 70 MG/DL) Please follow the RULE OF 15 for the treatment of hypoglycemia treatment (when your (blood sugars are less than 70 mg/dL)   STEP 1: Take 15 grams of carbohydrates when your blood sugar is low, which includes:  3-4 GLUCOSE TABS  OR 3-4 OZ OF JUICE OR REGULAR SODA OR ONE TUBE OF GLUCOSE GEL    STEP 2: RECHECK blood sugar in 15 MINUTES STEP 3: If your blood sugar is still low at the 15 minute recheck --> then, go back to STEP 1 and treat AGAIN with another 15 grams of carbohydrates.

## 2021-07-15 NOTE — Progress Notes (Signed)
Name: Mary Moyer  MRN/ DOB: [id number], Dec 19, 1970   Age/ Sex: 51 y.o., female    PCP: Jeanie Sewer, NP   Reason for Endocrinology Evaluation: Type 2 Diabetes Mellitus     Date of Initial Endocrinology Visit: 12/16/2020    PATIENT IDENTIFIER: Mary Moyer is a 51 y.o. female with a past medical history of T2DM, Hypothyroidism and HTN, S/P right nephrectomy due to renal cell carcinoma (03/2021) . the patient presented for initial endocrinology clinic visit on 12/16/2020 for consultative assistance with her diabetes management.    HPI: Mary Moyer was    Diagnosed with DM 2020 Prior Medications tried/Intolerance: Glipizide, Metformin             Hemoglobin A1c has ranged from 7.4% in 2020, peaking at 11.5% in 2021.   On her initial visit to our clinic she had an A1c of 8.8%, she needed clearance for right nephrectomy after optimization of glucose control.  We started Rybelsus, continue glipizide and metformin  She was also started on atorvastatin 10 mg with an LDL of 128 mg/DL   THYROID HISTORY: She has been diagnosed with hypothyroidism in 2000. Denies prior neck sx or RAI ablation    Denies local neck symptoms Denies constipation   No FH of thyroid  Father with T2DM     SUBJECTIVE:   During the last visit (03/06/2021): A1c 6.7%, we continued glipizide, Rybelsus and metformin     Today (07/15/21): Mary Moyer is here for a followup on diabetes management. She checks her  blood sugars 1 times daily. The patient has  had hypoglycemic episodes since the last clinic visit, which typically occur rarely . The patient is not symptomatic with these episodes.   She is S/P right renal mass resection 03/2021 with pathology report consistent with clear cell renal carcinoma   She has felt " bad "in the past 3 weeks  with exhaustion, positional dizziness, joint pains and sleep disturbance     HOME DIABETES REGIMEN: Glipizide 5 mg XL before supper Metformin 850 mg   BID Rybelsus 7 mg daily Levothyroxine 200 mcg, 2 tabs on Sundays and 1 tab rest of the week Atorvastatin 10 mg daily    Statin: yes  ACE-I/ARB: yes Prior Diabetic Education: no     METER DOWNLOAD SUMMARY: 6/28-7/11/2021 Fingerstick Blood Glucose Tests = 11 Overall Mean FS Glucose = 105 Standard Deviation = 12  BG Ranges: Low = 90 High = 126    Hypoglycemic Events/30 Days: BG < 50 = 0 Episodes of symptomatic severe hypoglycemia = 0    DIABETIC COMPLICATIONS: Microvascular complications:   Denies: CKD, retinopathy , neuropathy  Last eye exam: Completed 2021  Macrovascular complications:   Denies: CAD, PVD, CVA   PAST HISTORY: Past Medical History:  Past Medical History:  Diagnosis Date   Anemia    Anxiety    Arthritis    Cancer ([place])    Chronic kidney disease    Depression    Diabetes ([place])    Dyslipidemia 12/17/2020   Fatty liver    Headache    Hyperlipidemia 03/22/2018   Hypertension    Hypothyroidism    Hypothyroidism 03/22/2018   Migraines    Thyroid disease    UTI (urinary tract infection)    Past Surgical History:  Past Surgical History:  Procedure Laterality Date   ABLATION     ROBOT ASSISTED LAPAROSCOPIC NEPHRECTOMY Right 03/26/2021   Procedure: XI ROBOTIC ASSISTED LAPAROSCOPIC NEPHRECTOMY;  Surgeon: Ceasar Mons, MD;  Location:  [facility];  Service: Urology;  Laterality: Right;   UTERINE FIBROID SURGERY  2003    Social History:  reports that she has never smoked. She has never used smokeless tobacco. She reports that she does not currently use alcohol. She reports that she does not use drugs. Family History:  Family History  Problem Relation Age of Onset   Kidney disease Mother    Arthritis Mother    Hypertension Mother    23 / [place] Mother    Depression Mother    Diabetes Father    Heart attack Father    Heart disease Father    Hyperlipidemia Father    Hypertension Father    Depression Sister     Heart disease Sister    Hypertension Sister    Kidney disease Maternal Grandmother    Miscarriages / Stillbirths Maternal Grandmother    Hypertension Paternal Grandmother    Heart attack Paternal Grandmother    Cancer Paternal Grandfather    Diabetes Paternal Grandfather    Hypertension Paternal Grandfather    Asthma Daughter    Depression Daughter    Diabetes Daughter    Hypertension Daughter    24 / [place] Daughter    Colon cancer Neg Hx    Esophageal cancer Neg Hx    Pancreatic cancer Neg Hx    Stomach cancer Neg Hx      HOME MEDICATIONS: Allergies as of 07/15/2021       Reactions   Amlodipine Other (See Comments)   Headache with 70m   Orange Fruit [citrus]    migrains--headache   Latex Rash        Medication List        Accurate as of July 15, 2021  7:39 AM. If you have any questions, ask your nurse or doctor.          STOP taking these medications    docusate sodium 100 MG capsule Commonly known as: COLACE Stopped by: IDorita Sciara MD   HYDROcodone-acetaminophen 5-325 MG tablet Commonly known as: Norco Stopped by: IDorita Sciara MD   promethazine 12.5 MG tablet Commonly known as: PHENERGAN Stopped by: IDorita Sciara MD       TAKE these medications    atorvastatin 10 MG tablet Commonly known as: LIPITOR Take 1 tablet (10 mg total) by mouth daily.   blood glucose meter kit and supplies Kit Dispense based on patient and insurance preference. Use once daily before breakfast. E11.65   glipiZIDE 5 MG 24 hr tablet Commonly known as: GLUCOTROL XL Take 1 tablet (5 mg total) by mouth daily. What changed: when to take this   levothyroxine 200 MCG tablet Commonly known as: SYNTHROID Take 1 tablet (200 mcg total) by mouth as directed. Schedule appt with endocrinology for additional refills What changed:  how much to take additional instructions   lisinopril 10 MG tablet Commonly known as: ZESTRIL Take 1  tablet (10 mg total) by mouth daily.   metFORMIN 850 MG tablet Commonly known as: GLUCOPHAGE Take 1 tablet (850 mg total) by mouth 2 (two) times daily with a meal.   OneTouch Verio test strip Generic drug: glucose blood 1 each by Other route in the morning and at bedtime. Use as instructed   Rybelsus 7 MG Tabs Generic drug: Semaglutide Take 7 mg by mouth daily.         ALLERGIES: Allergies  Allergen Reactions   Amlodipine Other (See Comments)    Headache with 560m  OrHaig Prophet  Fruit [Citrus]     migrains--headache   Latex Rash    OBJECTIVE:   VITAL SIGNS: BP 118/76 (BP Location: Left Arm, Patient Position: Sitting, Cuff Size: Large)   Pulse 70   Ht '5\' 7"'  (1.702 m)   Wt (!) 305 lb (138.3 kg)   SpO2 95%   BMI 47.77 kg/m    PHYSICAL EXAM:  General: Pt appears well and is in NAD  Neck: General: Supple without adenopathy or carotid bruits. Thyroid: Thyroid size normal.  No goiter or nodules appreciated.  Lungs: Clear with good BS bilat with no rales, rhonchi, or wheezes  Heart: RRR with normal S1 and S2 and no gallops; no murmurs; no rub  Abdomen: Normoactive bowel sounds, soft, nontender, without masses or organomegaly palpable  Extremities:  Lower extremities - No pretibial edema. No lesions.  Neuro: MS is good with appropriate affect, pt is alert and Ox3   DM Foot Exam 03/06/2021 The skin of the feet is intact without sores or ulcerations. The pedal pulses are 2+ on right and 2+ on left. The sensation is intact to a screening 5.07, 10 gram monofilament bilaterally  DATA REVIEWED:  Lab Results  Component Value Date   HGBA1C 6.1 (A) 07/15/2021   HGBA1C 6.7 (A) 03/06/2021   HGBA1C 8.8 (A) 12/16/2020    Latest Reference Range & Units 07/15/21 08:00  VITD 30.00 - 100.00 ng/mL 22.97 (L)  Vitamin B12 211 - 911 pg/mL 302  WBC 4.0 - 10.5 K/uL 7.8  RBC 3.87 - 5.11 Mil/uL 4.20  Hemoglobin 12.0 - 15.0 g/dL 11.9 (L)  HCT 36.0 - 46.0 % 36.5  MCV 78.0 - 100.0 fl 87.0   MCHC 30.0 - 36.0 g/dL 32.4  RDW 11.5 - 15.5 % 14.6  Platelets 150.0 - 400.0 K/uL 242.0    Latest Reference Range & Units 07/15/21 08:00  TSH 0.35 - 5.50 uIU/mL 0.41     Latest Reference Range & Units 07/15/21 08:00  Sodium 135 - 145 mEq/L 137  Potassium 3.5 - 5.1 mEq/L 4.2  Chloride 96 - 112 mEq/L 104  CO2 19 - 32 mEq/L 24  Glucose 70 - 99 mg/dL 81  BUN 6 - 23 mg/dL 21  Creatinine 0.40 - 1.20 mg/dL 0.98  Calcium 8.4 - 10.5 mg/dL 9.6  Alkaline Phosphatase 39 - 117 U/L 62  Albumin 3.5 - 5.2 g/dL 4.2  AST 0 - 37 U/L 23  ALT 0 - 35 U/L 32  Total Protein 6.0 - 8.3 g/dL 7.2  Total Bilirubin 0.2 - 1.2 mg/dL 0.2  GFR >60.00 mL/min 67.70        Latest Reference Range & Units 03/06/21 08:07  MICROALB/CREAT RATIO 0.0 - 30.0 mg/g 1.5  NonHDL  100.63  Triglycerides 0.0 - 149.0 mg/dL 144.0  VLDL 0.0 - 40.0 mg/dL 28.8     Latest Reference Range & Units 03/06/21 08:07  Creatinine,U mg/dL 97.0  Microalb, Ur 0.0 - 1.9 mg/dL 1.4  MICROALB/CREAT RATIO 0.0 - 30.0 mg/g 1.5    ASSESSMENT / PLAN / RECOMMENDATIONS:   1) Type 2 Diabetes Mellitus, Optimally  controlled, Without complications - Most recent A1c of 6.1 %. Goal A1c < 7.0 %.    - I have praised the pt on improved glycemic control -BMP normal today -Given her low A1c of 6.1% we will stop glipizide  MEDICATIONS: Stop glipizide Continue Rybelsus 7 mg , 1 tablet Before Breakfast  Continue metformin 850 mg , 1 tablet twice a day before meals   EDUCATION /  INSTRUCTIONS: BG monitoring instructions: Patient is instructed to check her blood sugars 1 times a day, fasting . Call [facility] if: BG persistently < 70  I reviewed the Rule of 15 for the treatment of hypoglycemia in detail with the patient. Literature supplied.   2) Diabetic complications:  Eye: Does not have known diabetic retinopathy.  Neuro/ Feet: Does not have known diabetic peripheral neuropathy. Renal: Patient does not have known baseline  CKD. She is  on an ACEI/ARB at present.   3) Hypothyroidism:   - TSH continues to be normal but is trending down, I would suggest reducing levothyroxine as below to bring her TSH closer to 1-2 u IU/mL -To see if this has any contributing factors to fatigue  Medication  Continue levothyroxine 200 mcg , 1.5 on sundays and 1 tablet rest of the week   4) Dyslipidemia:    -LDL and triglycerides have normalized    Medication  Continue atorvastatin 10 mg daily    5) Fatigue :  -Discussed D/D of vitamin deficiency, sleep disturbance, dehydration and anemia  -Vitamin D is low, she will be advised to start OTC vitamin D -Vitamin B12 low limit of normal, will start OTC B12 -She has chronic anemia but this has been improving, I will encourage the patient to consume iron rich foods  -Start vitamin D3 2000 IU daily -Start vitamin B12 1000 mcg daily    F/U in 6 months   Signed electronically by: Mack Guise, MD  [facility]  [address] [place], [place] [address] Phone: [phone] FAX: [phone]   CC: Jeanie Sewer, [address] [place] [address] Phone: [phone]  Fax: [phone]    Return to Endocrinology clinic as below: No future appointments.

## 2021-07-16 ENCOUNTER — Encounter: Payer: Self-pay | Admitting: Internal Medicine

## 2021-08-06 DIAGNOSIS — C641 Malignant neoplasm of right kidney, except renal pelvis: Secondary | ICD-10-CM | POA: Diagnosis not present

## 2021-08-11 DIAGNOSIS — C641 Malignant neoplasm of right kidney, except renal pelvis: Secondary | ICD-10-CM | POA: Diagnosis not present

## 2021-08-11 DIAGNOSIS — R911 Solitary pulmonary nodule: Secondary | ICD-10-CM | POA: Diagnosis not present

## 2021-08-19 ENCOUNTER — Other Ambulatory Visit (HOSPITAL_COMMUNITY)
Admission: RE | Admit: 2021-08-19 | Discharge: 2021-08-19 | Disposition: A | Discharge status: Home / Self Care | Payer: BC Managed Care – PPO | Source: Ambulatory Visit | Attending: Family | Admitting: Family

## 2021-08-19 ENCOUNTER — Ambulatory Visit (INDEPENDENT_AMBULATORY_CARE_PROVIDER_SITE_OTHER): Payer: BC Managed Care – PPO | Admitting: Family

## 2021-08-19 ENCOUNTER — Encounter: Payer: Self-pay | Admitting: Family

## 2021-08-19 VITALS — BP 115/70 | HR 71 | Temp 98.0°F | Ht 67.0 in | Wt 297.2 lb

## 2021-08-19 DIAGNOSIS — Z0000 Encounter for general adult medical examination without abnormal findings: Secondary | ICD-10-CM | POA: Insufficient documentation

## 2021-08-19 DIAGNOSIS — Z1211 Encounter for screening for malignant neoplasm of colon: Secondary | ICD-10-CM

## 2021-08-19 DIAGNOSIS — Z1231 Encounter for screening mammogram for malignant neoplasm of breast: Secondary | ICD-10-CM | POA: Diagnosis not present

## 2021-08-19 LAB — CBC WITH DIFFERENTIAL/PLATELET
Basophils Absolute: 0 10*3/uL (ref 0.0–0.1)
Basophils Relative: 0.4 % (ref 0.0–3.0)
Eosinophils Absolute: 0.1 10*3/uL (ref 0.0–0.7)
Eosinophils Relative: 2.1 % (ref 0.0–5.0)
HCT: 38.2 % (ref 36.0–46.0)
Hemoglobin: 12.3 g/dL (ref 12.0–15.0)
Lymphocytes Relative: 26.3 % (ref 12.0–46.0)
Lymphs Abs: 1.6 10*3/uL (ref 0.7–4.0)
MCHC: 32.2 g/dL (ref 30.0–36.0)
MCV: 87.2 fl (ref 78.0–100.0)
Monocytes Absolute: 0.5 10*3/uL (ref 0.1–1.0)
Monocytes Relative: 8.2 % (ref 3.0–12.0)
Neutro Abs: 3.8 10*3/uL (ref 1.4–7.7)
Neutrophils Relative %: 63 % (ref 43.0–77.0)
Platelets: 232 10*3/uL (ref 150.0–400.0)
RBC: 4.38 Mil/uL (ref 3.87–5.11)
RDW: 13.8 % (ref 11.5–15.5)
WBC: 6 10*3/uL (ref 4.0–10.5)

## 2021-08-19 LAB — LIPID PANEL
Cholesterol: 143 mg/dL (ref 0–200)
HDL: 35.4 mg/dL — ABNORMAL LOW (ref >39.00)
LDL Cholesterol: 81 mg/dL (ref 0–99)
NonHDL: 107.21
Total CHOL/HDL Ratio: 4
Triglycerides: 133 mg/dL (ref 0.0–149.0)
VLDL: 26.6 mg/dL (ref 0.0–40.0)

## 2021-08-19 LAB — COMPREHENSIVE METABOLIC PANEL
ALT: 48 U/L — ABNORMAL HIGH (ref 0–35)
AST: 35 U/L (ref 0–37)
Albumin: 4.5 g/dL (ref 3.5–5.2)
Alkaline Phosphatase: 57 U/L (ref 39–117)
BUN: 20 mg/dL (ref 6–23)
CO2: 25 mEq/L (ref 19–32)
Calcium: 10 mg/dL (ref 8.4–10.5)
Chloride: 103 mEq/L (ref 96–112)
Creatinine, Ser: 1.12 mg/dL (ref 0.40–1.20)
GFR: 57.64 mL/min — ABNORMAL LOW (ref >60.00)
Glucose, Bld: 106 mg/dL — ABNORMAL HIGH (ref 70–99)
Potassium: 5.1 mEq/L (ref 3.5–5.1)
Sodium: 139 mEq/L (ref 135–145)
Total Bilirubin: 0.2 mg/dL (ref 0.2–1.2)
Total Protein: 7.6 g/dL (ref 6.0–8.3)

## 2021-08-19 LAB — TSH: TSH: 0.14 u[IU]/mL — ABNORMAL LOW (ref 0.35–5.50)

## 2021-08-19 NOTE — Patient Instructions (Signed)
It was very nice to see you today!    I will review your lab results via MyChart in a few days.  I have sent a new order for your Cologuard test.  I have sent an order for a Mammogram via the [facility], they will call you to schedule.  Have a great rest of the summer!    PLEASE NOTE:  If you had any lab tests please let us know if you have not heard back within a few days. You may see your results on MyChart before we have a chance to review them but we will give you a call once they are reviewed by [place]. If we ordered any referrals today, please let us know if you have not heard from their office within the next week.

## 2021-08-19 NOTE — Progress Notes (Signed)
Phone [phone]  Subjective:   Patient is a 51 y.o. female presenting for annual physical.    Chief Complaint  Patient presents with   Annual Exam    Fasting W/ labs, Pap     See problem oriented charting- ROS- full  review of systems was completed and negative  The following were reviewed and entered/updated in epic: Past Medical History:  Diagnosis Date   Anemia    Anxiety    Arthritis    Cancer ([place])    Chronic kidney disease    Depression    Diabetes ([place])    Dyslipidemia 12/17/2020   Fatty liver    Headache    Hyperlipidemia 03/22/2018   Hypertension    Hypothyroidism    Hypothyroidism 03/22/2018   Migraines    Thyroid disease    UTI (urinary tract infection)    Patient Active Problem List   Diagnosis Date Noted   Renal mass 03/26/2021   Hyperlipidemia due to type 2 diabetes mellitus ([place]) 02/24/2021   Type 2 diabetes mellitus with hyperglycemia, without long-term current use of insulin ([place]) 02/24/2021   Insomnia due to medical condition 02/24/2021   Vitamin D deficiency 12/17/2020   Acquired hypothyroidism 12/16/2020   Renal mass, right 06/27/2020   Elevated LFTs 07/31/2019   Hx of endometriosis 06/03/2018   Iron deficiency anemia due to chronic blood loss 05/06/2018   Depression, major, single episode, severe ([place]) 03/30/2018   Diuretic-induced hypokalemia 03/30/2018   GAD (generalized anxiety disorder) 03/30/2018   Essential hypertension 03/22/2018   Morbid obesity ([place]) 03/22/2018   History of PCOS 03/22/2018   Past Surgical History:  Procedure Laterality Date   ABLATION     ROBOT ASSISTED LAPAROSCOPIC NEPHRECTOMY Right 03/26/2021   Procedure: XI ROBOTIC ASSISTED LAPAROSCOPIC NEPHRECTOMY;  Surgeon: Ceasar Mons, MD;  Location: [facility];  Service: Urology;  Laterality: Right;   UTERINE FIBROID SURGERY  2003    Family History  Problem Relation Age of Onset   Kidney disease Mother    Arthritis Mother    Hypertension Mother     48 / [place] Mother    Depression Mother    Diabetes Father    Heart attack Father    Heart disease Father    Hyperlipidemia Father    Hypertension Father    Depression Sister    Heart disease Sister    Hypertension Sister    Kidney disease Maternal Grandmother    Miscarriages / Stillbirths Maternal Grandmother    Hypertension Paternal Grandmother    Heart attack Paternal Grandmother    Cancer Paternal Grandfather    Diabetes Paternal Grandfather    Hypertension Paternal Grandfather    Asthma Daughter    Depression Daughter    Diabetes Daughter    Hypertension Daughter    58 / [place] Daughter    Colon cancer Neg Hx    Esophageal cancer Neg Hx    Pancreatic cancer Neg Hx    Stomach cancer Neg Hx     Medications- reviewed and updated Current Outpatient Medications  Medication Sig Dispense Refill   atorvastatin (LIPITOR) 10 MG tablet Take 1 tablet (10 mg total) by mouth daily. 90 tablet 3   blood glucose meter kit and supplies KIT Dispense based on patient and insurance preference. Use once daily before breakfast. E11.65 1 each 0   glucose blood (ONETOUCH VERIO) test strip 1 each by Other route in the morning and at bedtime. Use as instructed 200 each 3   levothyroxine (SYNTHROID) 200  MCG tablet Take 1 tablet (200 mcg total) by mouth as directed. Schedule appt with endocrinology for additional refills (Patient taking differently: Take 200-400 mcg by mouth as directed. Take 1 tablet (200 mcg) Daily Except on Sundays Take 2 tablets (400 mcg). Schedule appt with endocrinology for additional refills.) 104 tablet 3   lisinopril (ZESTRIL) 10 MG tablet Take 1 tablet (10 mg total) by mouth daily. 90 tablet 3   metFORMIN (GLUCOPHAGE) 850 MG tablet Take 1 tablet (850 mg total) by mouth 2 (two) times daily with a meal. 180 tablet 3   Semaglutide (RYBELSUS) 7 MG TABS Take 7 mg by mouth daily. 90 tablet 3   No current facility-administered medications for this  visit.    Allergies-reviewed and updated Allergies  Allergen Reactions   Amlodipine Other (See Comments)    Headache with 37m   Orange Fruit [Citrus]     migrains--headache   Latex Rash    Social History   Social History Narrative   Not on file    Objective:  BP 115/70 (BP Location: Left Arm, Patient Position: Sitting, Cuff Size: Large)   Pulse 71   Temp 98 F (36.7 C) (Temporal)   Ht 5' 7" (1.702 m)   Wt 297 lb 3.2 oz (134.8 kg)   LMP 06/18/2021 (Approximate)   SpO2 98%   BMI 46.55 kg/m  Physical Exam Vitals and nursing note reviewed. Exam conducted with a chaperone present.  Constitutional:      Appearance: Normal appearance.  HENT:     Head: Normocephalic.     Right Ear: Tympanic membrane normal.     Left Ear: Tympanic membrane normal.     Nose: Nose normal.     Mouth/Throat:     Mouth: Mucous membranes are moist.  Eyes:     Pupils: Pupils are equal, round, and reactive to light.  Cardiovascular:     Rate and Rhythm: Normal rate and regular rhythm.  Pulmonary:     Effort: Pulmonary effort is normal.     Breath sounds: Normal breath sounds.  Genitourinary:    Exam position: Lithotomy position.     Pubic Area: No rash or pubic lice.      Labia:        Right: No rash or tenderness.        Left: No rash or tenderness.      Vagina: Normal.     Comments: cervix not well-visualized, PAP smear specimen obtained Musculoskeletal:        General: Normal range of motion.     Cervical back: Normal range of motion.  Lymphadenopathy:     Cervical: No cervical adenopathy.  Skin:    General: Skin is warm and dry.  Neurological:     Mental Status: She is alert.  Psychiatric:        Mood and Affect: Mood normal.        Behavior: Behavior normal.      Assessment and Plan   Health Maintenance counseling: 1. Anticipatory guidance: Patient counseled regarding regular dental exams q6 months, eye exams,  avoiding smoking and second hand smoke, limiting alcohol to 1  beverage per day, no illicit drugs.   2. Risk factor reduction:  Advised patient of need for regular exercise and diet rich with fruits and vegetables to reduce risk of heart attack and stroke. Exercise- walking some.  Wt Readings from Last 3 Encounters:  08/19/21 297 lb 3.2 oz (134.8 kg)  07/15/21 (!) 305 lb (138.3 kg)  03/26/21 (!) 306 lb 7 oz (139 kg)   3. Immunizations/screenings/ancillary studies Immunization History  Administered Date(s) Administered   PFIZER(Purple Top)SARS-COV-2 Vaccination 03/24/2019, 04/19/2019, 01/26/2020   Health Maintenance Due  Topic Date Due   FOOT EXAM  Never done   PAP SMEAR-Modifier  Never done   COLONOSCOPY (Pts 45-106yr Insurance coverage will need to be confirmed)  Never done   COVID-19 Vaccine (4 - Pfizer series) 03/22/2020   INFLUENZA VACCINE  08/05/2021    4. Cervical cancer screening- today, hx of positive HPV, had negative biopsy 5. Breast cancer screening-  mammogram: ordering today 6. Colon cancer screening - reordering today, never received in Feb. 7. Skin cancer screening- advised regular sunscreen use. Denies worrisome, changing, or new skin lesions.  8. Birth control/STD check- none 9. Osteoporosis screening- N/A 10. Alcohol screening: none 11. Smoking associated screening (lung cancer screening, AAA screen 65-75, UA)- non- smoker  Problem List Items Addressed This Visit   None Visit Diagnoses     Annual physical exam    -  Primary   Relevant Orders   Comprehensive metabolic panel   TSH   Lipid panel   CBC with Differential/Platelet   Cytology - PAP   Breast cancer screening by mammogram       Relevant Orders   MM DIGITAL SCREENING BILATERAL   Screening for colon cancer       Relevant Orders   Cologuard       Recommended follow up: Return for any future concerns. Future Appointments  Date Time Provider DElverta 01/15/2022  7:30 AM Shamleffer, IMelanie Crazier MD [id number] None    Lab/Order  associations: fasting   HJeanie Sewer NP

## 2021-08-21 LAB — CYTOLOGY - PAP
Adequacy: ABSENT
Comment: NEGATIVE
High risk HPV: POSITIVE — AB

## 2021-08-26 ENCOUNTER — Encounter: Payer: Self-pay | Admitting: Family

## 2021-08-26 DIAGNOSIS — R87612 Low grade squamous intraepithelial lesion on cytologic smear of cervix (LGSIL): Secondary | ICD-10-CM

## 2021-08-26 NOTE — Progress Notes (Signed)
Your Glucose (sugar), electrolytes,  liver function, blood count, and cholesterol numbers are all good.   Your TSH is low which indicates your actual thyroid level may be a little high, and you may want to let Dr. Kelton Pillar know, or if you have an upcoming appointment can let her know then, she may want to check your actual T4 level.  The TSH could also be fluctuating some due to your recent surgery. Your kidney function is down slightly but most likely due to losing 1/2 of your total function! Remember to drink at least 2 L of water daily to stay hydrated, your function should improve some over time.  Also may need to discuss reducing Metformin in the future with Dr. Kelton Pillar as this can affect the kidneys over time.

## 2021-08-28 ENCOUNTER — Ambulatory Visit
Admission: RE | Admit: 2021-08-28 | Discharge: 2021-08-28 | Disposition: A | Discharge status: Home / Self Care | Payer: BC Managed Care – PPO | Source: Ambulatory Visit | Attending: Family | Admitting: Family

## 2021-08-28 DIAGNOSIS — Z1231 Encounter for screening mammogram for malignant neoplasm of breast: Secondary | ICD-10-CM

## 2021-09-10 DIAGNOSIS — Z1211 Encounter for screening for malignant neoplasm of colon: Secondary | ICD-10-CM | POA: Diagnosis not present

## 2021-09-17 LAB — COLOGUARD: COLOGUARD: NEGATIVE

## 2021-09-17 NOTE — Progress Notes (Signed)
Your Cologuard test is negative. Need to retest in 3 years, or can choose to do a Colonoscopy at that time.

## 2021-09-29 ENCOUNTER — Encounter: Payer: Self-pay | Admitting: *Deleted

## 2021-11-09 ENCOUNTER — Encounter (HOSPITAL_COMMUNITY): Payer: Self-pay

## 2021-11-09 ENCOUNTER — Ambulatory Visit (HOSPITAL_COMMUNITY)
Admission: RE | Admit: 2021-11-09 | Discharge: 2021-11-09 | Disposition: A | Discharge status: Home / Self Care | Payer: BC Managed Care – PPO | Source: Ambulatory Visit | Attending: Family Medicine | Admitting: Family Medicine

## 2021-11-09 VITALS — BP 118/66 | HR 99 | Temp 98.0°F | Resp 12

## 2021-11-09 DIAGNOSIS — J069 Acute upper respiratory infection, unspecified: Secondary | ICD-10-CM | POA: Diagnosis not present

## 2021-11-09 DIAGNOSIS — L237 Allergic contact dermatitis due to plants, except food: Secondary | ICD-10-CM | POA: Diagnosis not present

## 2021-11-09 DIAGNOSIS — R070 Pain in throat: Secondary | ICD-10-CM | POA: Insufficient documentation

## 2021-11-09 LAB — POCT RAPID STREP A, ED / UC: Streptococcus, Group A Screen (Direct): NEGATIVE

## 2021-11-09 MED ORDER — BENZONATATE 100 MG PO CAPS: 100.0000 mg | ORAL_CAPSULE | Freq: Three times a day (TID) | ORAL | 0 refills | Status: DC | PRN

## 2021-11-09 MED ORDER — PREDNISONE 20 MG PO TABS: 40.0000 mg | ORAL_TABLET | Freq: Every day | ORAL | 0 refills | Status: AC

## 2021-11-09 NOTE — ED Triage Notes (Signed)
Pt is here for cough, rash all over body x 1wk

## 2021-11-09 NOTE — ED Provider Notes (Addendum)
[place]    CSN: [id number] Arrival date & time: 11/09/21  1039      History   Chief Complaint Chief Complaint  Patient presents with   Cough    1. I have a bad cough/cold. But am now considering it might be strep. I feel awful and have felt bad since Tuesday. 2.  I was exposed to poison oak last weekend and it is spreading instead of getting better.  Maybe need a steroid? - Entered by patient    HPI Mary Moyer is a 51 y.o. female.    Cough  Here for congestion and cough that began on October 31.  She also has had some sore throat.  No fever at any point.  It is of breath except sometimes when she has had a coughing fit.  She does only have 1 kidney, and expresses concern about strep since that has a potential for causing glomerulonephritis.  No history of asthma; yesterday she had felt worse with a lot of malaise, but this morning she is feeling better.    Past Medical History:  Diagnosis Date   Anemia    Anxiety    Arthritis    Cancer ([place])    Chronic kidney disease    Depression    Diabetes ([place])    Dyslipidemia 12/17/2020   Fatty liver    Headache    Hyperlipidemia 03/22/2018   Hypertension    Hypothyroidism    Hypothyroidism 03/22/2018   Migraines    Thyroid disease    UTI (urinary tract infection)     Patient Active Problem List   Diagnosis Date Noted   Renal mass 03/26/2021   Hyperlipidemia due to type 2 diabetes mellitus ([place]) 02/24/2021   Type 2 diabetes mellitus with hyperglycemia, without long-term current use of insulin ([place]) 02/24/2021   Insomnia due to medical condition 02/24/2021   Vitamin D deficiency 12/17/2020   Acquired hypothyroidism 12/16/2020   Renal mass, right 06/27/2020   Elevated LFTs 07/31/2019   Hx of endometriosis 06/03/2018   Iron deficiency anemia due to chronic blood loss 05/06/2018   Depression, major, single episode, severe ([place]) 03/30/2018   Diuretic-induced hypokalemia 03/30/2018   GAD (generalized  anxiety disorder) 03/30/2018   Essential hypertension 03/22/2018   Morbid obesity ([place]) 03/22/2018   History of PCOS 03/22/2018    Past Surgical History:  Procedure Laterality Date   ABLATION     ROBOT ASSISTED LAPAROSCOPIC NEPHRECTOMY Right 03/26/2021   Procedure: XI ROBOTIC ASSISTED LAPAROSCOPIC NEPHRECTOMY;  Surgeon: Ceasar Mons, MD;  Location: [facility];  Service: Urology;  Laterality: Right;   UTERINE FIBROID SURGERY  2003    OB History   No obstetric history on file.      Home Medications    Prior to Admission medications   Medication Sig Start Date End Date Taking? Authorizing Provider  benzonatate (TESSALON) 100 MG capsule Take 1 capsule (100 mg total) by mouth 3 (three) times daily as needed for cough. 11/09/21  Yes Barrett Henle, MD  predniSONE (DELTASONE) 20 MG tablet Take 2 tablets (40 mg total) by mouth daily with breakfast for 5 days. 11/09/21 11/14/21 Yes Barrett Henle, MD  atorvastatin (LIPITOR) 10 MG tablet Take 1 tablet (10 mg total) by mouth daily. 12/16/20   Shamleffer, Melanie Crazier, MD  blood glucose meter kit and supplies KIT Dispense based on patient and insurance preference. Use once daily before breakfast. E11.65 11/06/19   Nche, Charlene Brooke, NP  glucose blood [facility]  VERIO) test strip 1 each by Other route in the morning and at bedtime. Use as instructed 12/16/20   Shamleffer, Melanie Crazier, MD  levothyroxine (SYNTHROID) 200 MCG tablet Take 1 tablet (200 mcg total) by mouth as directed. Schedule appt with endocrinology for additional refills Patient taking differently: Take 200-400 mcg by mouth as directed. Take 1 tablet (200 mcg) Daily Except on Sundays Take 2 tablets (400 mcg). Schedule appt with endocrinology for additional refills. 03/07/21   Shamleffer, Melanie Crazier, MD  lisinopril (ZESTRIL) 10 MG tablet Take 1 tablet (10 mg total) by mouth daily. 04/08/21   Jeanie Sewer, NP  metFORMIN (GLUCOPHAGE) 850 MG tablet Take 1  tablet (850 mg total) by mouth 2 (two) times daily with a meal. 12/16/20   Shamleffer, Melanie Crazier, MD  Semaglutide (RYBELSUS) 7 MG TABS Take 7 mg by mouth daily. 03/07/21   Shamleffer, Melanie Crazier, MD    Family History Family History  Problem Relation Age of Onset   Kidney disease Mother    Arthritis Mother    Hypertension Mother    30 / [place] Mother    Depression Mother    Diabetes Father    Heart attack Father    Heart disease Father    Hyperlipidemia Father    Hypertension Father    Depression Sister    Heart disease Sister    Hypertension Sister    Asthma Daughter    Depression Daughter    Diabetes Daughter    Hypertension Daughter    Miscarriages / [place] Daughter    Kidney disease Maternal Grandmother    Miscarriages / Stillbirths Maternal Grandmother    Hypertension Paternal Grandmother    Heart attack Paternal Grandmother    Cancer Paternal Grandfather    Diabetes Paternal Grandfather    Hypertension Paternal Grandfather    Colon cancer Neg Hx    Esophageal cancer Neg Hx    Pancreatic cancer Neg Hx    Stomach cancer Neg Hx    Breast cancer Neg Hx     Social History Social History   Tobacco Use   Smoking status: Never   Smokeless tobacco: Never  Vaping Use   Vaping Use: Never used  Substance Use Topics   Alcohol use: Not Currently   Drug use: Never     Allergies   Amlodipine, Orange fruit [citrus], and Latex   Review of Systems Review of Systems  Respiratory:  Positive for cough.      Physical Exam Triage Vital Signs ED Triage Vitals  Enc Vitals Group     BP 11/09/21 1124 118/66     Pulse Rate 11/09/21 1124 99     Resp 11/09/21 1124 12     Temp 11/09/21 1124 98 F (36.7 C)     Temp Source 11/09/21 1124 Oral     SpO2 11/09/21 1124 98 %     Weight --      Height --      Head Circumference --      Peak Flow --      Pain Score 11/09/21 1122 0     Pain Loc --      Pain Edu? --      Excl. in [place]? --    No  data found.  Updated Vital Signs BP 118/66 (BP Location: Right Arm)   Pulse 99   Temp 98 F (36.7 C) (Oral)   Resp 12   LMP 10/28/2021   SpO2 98%   Visual Acuity Right Eye Distance:  Left Eye Distance:   Bilateral Distance:    Right Eye Near:   Left Eye Near:    Bilateral Near:     Physical Exam Vitals reviewed.  Constitutional:      General: She is not in acute distress.    Appearance: She is not toxic-appearing.  HENT:     Right Ear: Tympanic membrane and ear canal normal.     Left Ear: Tympanic membrane and ear canal normal.     Nose: Congestion present.     Mouth/Throat:     Mouth: Mucous membranes are moist.     Comments: There is erythema of the posterior oropharynx.  Also there is some clear mucus draining Eyes:     Extraocular Movements: Extraocular movements intact.     Conjunctiva/sclera: Conjunctivae normal.     Pupils: Pupils are equal, round, and reactive to light.  Cardiovascular:     Rate and Rhythm: Normal rate and regular rhythm.     Heart sounds: No murmur heard. Pulmonary:     Effort: Pulmonary effort is normal. No respiratory distress.     Breath sounds: No stridor. No wheezing, rhonchi or rales.  Musculoskeletal:     Cervical back: Neck supple.  Lymphadenopathy:     Cervical: No cervical adenopathy.  Skin:    Capillary Refill: Capillary refill takes less than 2 seconds.     Coloration: Skin is not jaundiced or pale.     Comments: There is pink maculopapular rash on her legs and on her trunk.  Neurological:     General: No focal deficit present.     Mental Status: She is alert and oriented to person, place, and time.  Psychiatric:        Behavior: Behavior normal.      UC Treatments / Results  Labs (all labs ordered are listed, but only abnormal results are displayed) Labs Reviewed  CULTURE, GROUP A STREP [facility])  POCT RAPID STREP A, ED / UC    EKG   Radiology No results found.  Procedures Procedures (including critical  care time)  Medications Ordered in UC Medications - No data to display  Initial Impression / Assessment and Plan / UC Course  I have reviewed the triage vital signs and the nursing notes.  Pertinent labs & imaging results that were available during my care of the patient were reviewed by me and considered in my medical decision making (see chart for details).        Strep is negative so culture is sent and we will treat per protocol if positive.  We discussed considering viral swabs, but she is outside the window for treatment for anything.  Medication is sent for her cough  Also prednisone is sent in for the contact dermatitis. Final Clinical Impressions(s) / UC Diagnoses   Final diagnoses:  Viral URI with cough  Throat pain  Allergic contact dermatitis due to plants, except food     Discharge Instructions      Your strep test is negative.  Culture of the throat will be sent, and staff will notify you if that is in turn positive.  Take benzonatate 100 mg, 1 tab every 8 hours as needed for cough.       ED Prescriptions     Medication Sig Dispense Auth. Provider   benzonatate (TESSALON) 100 MG capsule Take 1 capsule (100 mg total) by mouth 3 (three) times daily as needed for cough. 21 capsule Barrett Henle, MD   predniSONE (DELTASONE)  20 MG tablet Take 2 tablets (40 mg total) by mouth daily with breakfast for 5 days. 10 tablet Windy Carina Gwenlyn Perking, MD      PDMP not reviewed this encounter.   Barrett Henle, MD 11/09/21 1211    Barrett Henle, MD 11/09/21 1217    Barrett Henle, MD 11/09/21 [phone]

## 2021-11-09 NOTE — Discharge Instructions (Addendum)
Your strep test is negative.  Culture of the throat will be sent, and staff will notify you if that is in turn positive.  Take benzonatate 100 mg, 1 tab every 8 hours as needed for cough.

## 2021-11-12 LAB — CULTURE, GROUP A STREP (THRC)

## 2021-12-08 ENCOUNTER — Other Ambulatory Visit: Payer: Self-pay | Admitting: Internal Medicine

## 2021-12-18 ENCOUNTER — Encounter: Payer: Self-pay | Admitting: *Deleted

## 2021-12-31 ENCOUNTER — Other Ambulatory Visit: Payer: Self-pay

## 2021-12-31 ENCOUNTER — Ambulatory Visit (HOSPITAL_COMMUNITY)
Admission: EM | Admit: 2021-12-31 | Discharge: 2021-12-31 | Disposition: A | Discharge status: Home / Self Care | Payer: BC Managed Care – PPO | Attending: Emergency Medicine | Admitting: Emergency Medicine

## 2021-12-31 ENCOUNTER — Encounter (HOSPITAL_COMMUNITY): Payer: Self-pay | Admitting: Emergency Medicine

## 2021-12-31 DIAGNOSIS — Z1152 Encounter for screening for COVID-19: Secondary | ICD-10-CM | POA: Insufficient documentation

## 2021-12-31 DIAGNOSIS — J069 Acute upper respiratory infection, unspecified: Secondary | ICD-10-CM | POA: Diagnosis not present

## 2021-12-31 LAB — SARS CORONAVIRUS 2 (TAT 6-24 HRS): SARS Coronavirus 2: NEGATIVE

## 2021-12-31 MED ORDER — BENZONATATE 100 MG PO CAPS: 100.0000 mg | ORAL_CAPSULE | Freq: Three times a day (TID) | ORAL | 0 refills | Status: DC

## 2021-12-31 MED ORDER — AMOXICILLIN-POT CLAVULANATE 875-125 MG PO TABS: 1.0000 | ORAL_TABLET | Freq: Two times a day (BID) | ORAL | 0 refills | Status: DC

## 2021-12-31 MED ORDER — KETOROLAC TROMETHAMINE 30 MG/ML IJ SOLN
30.0000 mg | Freq: Once | INTRAMUSCULAR | Status: AC
  Administered 2021-12-31: 30 mg via INTRAMUSCULAR

## 2021-12-31 MED ORDER — ONETOUCH ULTRASOFT LANCETS MISC: 12 refills | Status: DC

## 2021-12-31 MED ORDER — PREDNISONE 20 MG PO TABS: 40.0000 mg | ORAL_TABLET | Freq: Every day | ORAL | 0 refills | Status: DC

## 2021-12-31 MED ORDER — PROMETHAZINE-DM 6.25-15 MG/5ML PO SYRP: 5.0000 mL | ORAL_SOLUTION | Freq: Four times a day (QID) | ORAL | 0 refills | Status: DC | PRN

## 2021-12-31 NOTE — ED Provider Notes (Signed)
[place]    CSN: [id number] Arrival date & time: 12/31/21  2025      History   Chief Complaint No chief complaint on file.   HPI Junell Cullifer is a 51 y.o. female.   Patient presents for evaluation of subjective fever, chills, nasal congestion, rhinorrhea, sore throat, cough and headache beginning 4 days ago.  Symptoms are worsened when lying down, begins to hear a crackling sound to the chest.  Cough is nonproductive.  Headache has been constant, generalized described as severe, worsened by coughing which causes associated eye pain.  Endorses increased fatigue.  Known sick contacts with bronchitis and RSV.  Decreased appetite but drinking fluids.  Endorses point-of-care CBG elevated at at home peaking at 197, taking metformin and Rybelsus as prescribed.  Denies shortness of breath or wheezing.    Past Medical History:  Diagnosis Date   Anemia    Anxiety    Arthritis    Cancer ([place])    Chronic kidney disease    Depression    Diabetes ([place])    Dyslipidemia 12/17/2020   Fatty liver    Headache    Hyperlipidemia 03/22/2018   Hypertension    Hypothyroidism    Hypothyroidism 03/22/2018   Migraines    Thyroid disease    UTI (urinary tract infection)     Patient Active Problem List   Diagnosis Date Noted   Renal mass 03/26/2021   Hyperlipidemia due to type 2 diabetes mellitus ([place]) 02/24/2021   Type 2 diabetes mellitus with hyperglycemia, without long-term current use of insulin ([place]) 02/24/2021   Insomnia due to medical condition 02/24/2021   Vitamin D deficiency 12/17/2020   Acquired hypothyroidism 12/16/2020   Renal mass, right 06/27/2020   Elevated LFTs 07/31/2019   Hx of endometriosis 06/03/2018   Iron deficiency anemia due to chronic blood loss 05/06/2018   Depression, major, single episode, severe ([place]) 03/30/2018   Diuretic-induced hypokalemia 03/30/2018   GAD (generalized anxiety disorder) 03/30/2018   Essential hypertension 03/22/2018    Morbid obesity ([place]) 03/22/2018   History of PCOS 03/22/2018    Past Surgical History:  Procedure Laterality Date   ABLATION     ROBOT ASSISTED LAPAROSCOPIC NEPHRECTOMY Right 03/26/2021   Procedure: XI ROBOTIC ASSISTED LAPAROSCOPIC NEPHRECTOMY;  Surgeon: Ceasar Mons, MD;  Location[facility];  Service: Urology;  Laterality: Right;   UTERINE FIBROID SURGERY  2003    OB History   No obstetric history on file.      Home Medications    Prior to Admission medications   Medication Sig Start Date End Date Taking? Authorizing Provider  atorvastatin (LIPITOR) 10 MG tablet Take 1 tablet (10 mg total) by mouth daily. 12/16/20   Shamleffer, Melanie Crazier, MD  benzonatate (TESSALON) 100 MG capsule Take 1 capsule (100 mg total) by mouth 3 (three) times daily as needed for cough. 11/09/21   Barrett Henle, MD  blood glucose meter kit and supplies KIT Dispense based on patient and insurance preference. Use once daily before breakfast. E11.65 11/06/19   Nche, Charlene Brooke, NP  glucose blood (ONETOUCH VERIO) test strip 1 each by Other route in the morning and at bedtime. Use as instructed 12/16/20   Shamleffer, Melanie Crazier, MD  levothyroxine (SYNTHROID) 200 MCG tablet Take 1 tablet (200 mcg total) by mouth as directed. Schedule appt with endocrinology for additional refills Patient taking differently: Take 200-400 mcg by mouth as directed. Take 1 tablet (200 mcg) Daily Except on Sundays Take 2 tablets (  400 mcg). Schedule appt with endocrinology for additional refills. 03/07/21   Shamleffer, Melanie Crazier, MD  lisinopril (ZESTRIL) 10 MG tablet Take 1 tablet (10 mg total) by mouth daily. 04/08/21   Jeanie Sewer, NP  metFORMIN (GLUCOPHAGE) 850 MG tablet Take 1 tablet by mouth twice daily with food 12/08/21   Shamleffer, Melanie Crazier, MD  Semaglutide (RYBELSUS) 7 MG TABS Take 7 mg by mouth daily. 03/07/21   Shamleffer, Melanie Crazier, MD    Family History Family History  Problem  Relation Age of Onset   Kidney disease Mother    Arthritis Mother    Hypertension Mother    79 / [place] Mother    Depression Mother    Diabetes Father    Heart attack Father    Heart disease Father    Hyperlipidemia Father    Hypertension Father    Depression Sister    Heart disease Sister    Hypertension Sister    Asthma Daughter    Depression Daughter    Diabetes Daughter    Hypertension Daughter    Miscarriages / [place] Daughter    Kidney disease Maternal Grandmother    Miscarriages / Stillbirths Maternal Grandmother    Hypertension Paternal Grandmother    Heart attack Paternal Grandmother    Cancer Paternal Grandfather    Diabetes Paternal Grandfather    Hypertension Paternal Grandfather    Colon cancer Neg Hx    Esophageal cancer Neg Hx    Pancreatic cancer Neg Hx    Stomach cancer Neg Hx    Breast cancer Neg Hx     Social History Social History   Tobacco Use   Smoking status: Never   Smokeless tobacco: Never  Vaping Use   Vaping Use: Never used  Substance Use Topics   Alcohol use: Not Currently   Drug use: Never     Allergies   Amlodipine, Orange fruit [citrus], and Latex   Review of Systems Review of Systems  Constitutional:  Positive for chills and fever. Negative for activity change, appetite change, diaphoresis, fatigue and unexpected weight change.  HENT:  Positive for congestion, rhinorrhea and sore throat. Negative for dental problem, drooling, ear discharge, ear pain, facial swelling, hearing loss, mouth sores, nosebleeds, postnasal drip, sinus pressure, sinus pain, sneezing, tinnitus, trouble swallowing and voice change.   Respiratory:  Positive for cough. Negative for apnea, choking, chest tightness, shortness of breath, wheezing and stridor.   Cardiovascular: Negative.   Gastrointestinal: Negative.   Skin: Negative.      Physical Exam Triage Vital Signs ED Triage Vitals  Enc Vitals Group     BP 12/31/21 0913 128/71      Pulse Rate 12/31/21 0913 85     Resp 12/31/21 0913 17     Temp 12/31/21 0913 99.1 F (37.3 C)     Temp Source 12/31/21 0913 Oral     SpO2 12/31/21 0913 96 %     Weight --      Height --      Head Circumference --      Peak Flow --      Pain Score 12/31/21 0911 5     Pain Loc --      Pain Edu? --      Excl. in [place]? --    No data found.  Updated Vital Signs BP 128/71 (BP Location: Right Arm)   Pulse 85   Temp 99.1 F (37.3 C) (Oral)   Resp 17   LMP 10/22/2021  SpO2 96%   Visual Acuity Right Eye Distance:   Left Eye Distance:   Bilateral Distance:    Right Eye Near:   Left Eye Near:    Bilateral Near:     Physical Exam Constitutional:      Appearance: Normal appearance.  HENT:     Head: Normocephalic.     Right Ear: Tympanic membrane, ear canal and external ear normal.     Left Ear: Tympanic membrane, ear canal and external ear normal.     Nose: Congestion and rhinorrhea present.     Mouth/Throat:     Mouth: Mucous membranes are moist.     Pharynx: No posterior oropharyngeal erythema.  Cardiovascular:     Rate and Rhythm: Normal rate and regular rhythm.     Pulses: Normal pulses.     Heart sounds: Normal heart sounds.  Pulmonary:     Effort: Pulmonary effort is normal.     Breath sounds: Normal breath sounds.  Musculoskeletal:     Cervical back: Normal range of motion and neck supple.  Skin:    General: Skin is warm and dry.  Neurological:     Mental Status: She is alert and oriented to person, place, and time. Mental status is at baseline.  Psychiatric:        Mood and Affect: Mood normal.        Behavior: Behavior normal.      UC Treatments / Results  Labs (all labs ordered are listed, but only abnormal results are displayed) Labs Reviewed - No data to display  EKG   Radiology No results found.  Procedures Procedures (including critical care time)  Medications Ordered in UC Medications - No data to display  Initial Impression /  Assessment and Plan / UC Course  I have reviewed the triage vital signs and the nursing notes.  Pertinent labs & imaging results that were available during my care of the patient were reviewed by me and considered in my medical decision making (see chart for details).  Viral URI with cough  Patient is in no signs of distress nor toxic appearing.  Vital signs are stable.  Low suspicion for pneumonia, pneumothorax or bronchitis and therefore will defer imaging.  COVID test is pending, reviewed quarantine guidelines per CDC recommendations, will prescribe antiviral if positive  Prescribed prednisone, Tessalon and Promethazine DM, discussed effects of prednisone on point-of-care CBG, advised to monitor closely, while elevated from baseline at home, nonemergent, not symptomatic, advised to continue taking prescribed medication as directed watchful wait antibiotic placed at pharmacy if negative for COVID and symptoms persist for an additional 5 days without resolution.  Toradol injection given in office for management of headache.  May use additional over-the-counter medications as needed for supportive care.  May follow-up with urgent care as needed if symptoms persist or worsen.     Final Clinical Impressions(s) / UC Diagnoses   Final diagnoses:  None     Discharge Instructions      Elevated glucose is normal when ill, reported levels are nonemergent therefore continue to monitor and if still elevated after current illness has resolved please follow-up with endocrinologist for further evaluation and management   ED Prescriptions   None    PDMP not reviewed this encounter.   Hans [place], [place] 12/31/21 [phone]

## 2021-12-31 NOTE — ED Triage Notes (Signed)
Pt reports that her sugar has been high (190s) over the past couple days C/o headache and cough esp when laying down since Saturday. Also been having fever and chills. Pt also c/o joint pains. Hasn't taken medications for symptoms

## 2021-12-31 NOTE — Discharge Instructions (Addendum)
Your symptoms today are most likely being caused by a virus and should steadily improve in time it can take up to 7 to 10 days before you truly start to see a turnaround however things will get better, if no improvement seen by December 31 she may pick up Augmentin and begin use to provide coverage for bacteria  COVID test is pending, as you have been ill for 5 days you will not need to quarantine but will need to continue to wear mask for an additional 5 days, if positive we will prescribe antiviral treatment for you  Begin prednisone every morning for 5 days, this medicine helps to reduce inflammation and irritation and will help calm your cough, be mindful of this will increase your blood sugar temporarily,Elevated glucose is normal when ill, reported levels are nonemergent therefore continue to monitor and if still elevated after current illness has resolved please follow-up with endocrinologist for further evaluation and management  You may use Tessalon pill every 8 hours to help calm your coughing  You may use cough syrup every 6 hours as needed for additional comfort, be mindful of this will make you feel drowsy    You can take Tylenol and/or Ibuprofen as needed for fever reduction and pain relief.   For cough: honey 1/2 to 1 teaspoon (you can dilute the honey in water or another fluid).  You can also use guaifenesin and dextromethorphan for cough. You can use a humidifier for chest congestion and cough.  If you don't have a humidifier, you can sit in the bathroom with the hot shower running.      For sore throat: try warm salt water gargles, cepacol lozenges, throat spray, warm tea or water with lemon/honey, popsicles or ice, or OTC cold relief medicine for throat discomfort.   For congestion: take a daily anti-histamine like Zyrtec, Claritin, and a oral decongestant, such as pseudoephedrine.  You can also use Flonase 1-2 sprays in each nostril daily.   It is important to stay hydrated:  drink plenty of fluids (water, gatorade/powerade/pedialyte, juices, or teas) to keep your throat moisturized and help further relieve irritation/discomfort.

## 2022-01-14 NOTE — Progress Notes (Unsigned)
Name: Mary Moyer  MRN/ DOB: [id number], December 04, 1970   Age/ Sex: 52 y.o., female    PCP: Jeanie Sewer, NP   Reason for Endocrinology Evaluation: Type 2 Diabetes Mellitus     Date of Initial Endocrinology Visit: 12/16/2020    PATIENT IDENTIFIER: Mary Moyer is a 52 y.o. female with a past medical history of T2DM, Hypothyroidism and HTN, S/P right nephrectomy due to renal cell carcinoma (03/2021) . the patient presented for initial endocrinology clinic visit on 12/16/2020 for consultative assistance with her diabetes management.    HPI: Mary Moyer was    Diagnosed with DM 2020 Prior Medications tried/Intolerance: Glipizide, Metformin             Hemoglobin A1c has ranged from 7.4% in 2020, peaking at 11.5% in 2021.   On her initial visit to our clinic she had an A1c of 8.8%, she needed clearance for right nephrectomy after optimization of glucose control.  We started Rybelsus, continue glipizide and metformin  She was also started on atorvastatin 10 mg with an LDL of 128 mg/DL  Stopped glipizide 07/2021 with an A1c of 6.1%,  Started glimepiride 01/2022 due to A1c 8.2%   THYROID HISTORY: She has been diagnosed with hypothyroidism in 2000. Denies prior neck sx or RAI ablation    Denies local neck symptoms Denies constipation   No FH of thyroid  Father with T2DM     SUBJECTIVE:   During the last visit (07/15/2021): A1c 6.7%    Today (01/15/22): Mary Moyer is here for a followup on diabetes management. She checks her  blood sugars 1 times daily. The patient has not had hypoglycemic episodes since the last clinic visit, which typically occur rarely . The patient is not symptomatic with these episodes.   She is S/P right renal mass resection 03/2021 with pathology report consistent with clear cell renal carcinoma, continues with nephrology follow-up She has been noted with weight gain She has been sick intermittently since halloween with URI symptoms , requiring  steroids x2  and Abx , she has been depressed with all this as she is not feeling any better   She also has been noted with headaches that she attributes to BG's in the 160s, but attributes resolution of headaches when BG's are in the [place]: Metformin 850 mg  BID Rybelsus 7 mg daily Levothyroxine 200 mcg, daily  D3 2000 IU daily B12 1000 mcg daily   Statin: yes  ACE-I/ARB: yes Prior Diabetic Education: no     METER DOWNLOAD SUMMARY: 148- 924   DIABETIC COMPLICATIONS: Microvascular complications:   Denies: CKD, retinopathy , neuropathy  Last eye exam: Completed 2021  Macrovascular complications:   Denies: CAD, PVD, CVA   PAST HISTORY: Past Medical History:  Past Medical History:  Diagnosis Date   Anemia    Anxiety    Arthritis    Cancer ([place])    Chronic kidney disease    Depression    Diabetes ([place])    Dyslipidemia 12/17/2020   Fatty liver    Headache    Hyperlipidemia 03/22/2018   Hypertension    Hypothyroidism    Hypothyroidism 03/22/2018   Migraines    Thyroid disease    UTI (urinary tract infection)    Past Surgical History:  Past Surgical History:  Procedure Laterality Date   ABLATION     ROBOT ASSISTED LAPAROSCOPIC NEPHRECTOMY Right 03/26/2021   Procedure: XI ROBOTIC ASSISTED LAPAROSCOPIC NEPHRECTOMY;  Surgeon: Ceasar Mons, MD;  Location: [facility];  Service: Urology;  Laterality: Right;   UTERINE FIBROID SURGERY  2003    Social History:  reports that she has never smoked. She has never used smokeless tobacco. She reports that she does not currently use alcohol. She reports that she does not use drugs. Family History:  Family History  Problem Relation Age of Onset   Kidney disease Mother    Arthritis Mother    Hypertension Mother    77 / [place] Mother    Depression Mother    Diabetes Father    Heart attack Father    Heart disease Father    Hyperlipidemia Father    Hypertension Father     Depression Sister    Heart disease Sister    Hypertension Sister    Asthma Daughter    Depression Daughter    Diabetes Daughter    Hypertension Daughter    Miscarriages / [place] Daughter    Kidney disease Maternal Grandmother    Miscarriages / Stillbirths Maternal Grandmother    Hypertension Paternal Grandmother    Heart attack Paternal Grandmother    Cancer Paternal Grandfather    Diabetes Paternal Grandfather    Hypertension Paternal Grandfather    Colon cancer Neg Hx    Esophageal cancer Neg Hx    Pancreatic cancer Neg Hx    Stomach cancer Neg Hx    Breast cancer Neg Hx      HOME MEDICATIONS: Allergies as of 01/15/2022       Reactions   Amlodipine Other (See Comments)   Headache with '5mg'$    Orange Fruit [citrus]    migrains--headache   Latex Rash        Medication List        Accurate as of January 15, 2022  8:07 AM. If you have any questions, ask your nurse or doctor.          STOP taking these medications    amoxicillin-clavulanate 875-125 MG tablet Commonly known as: AUGMENTIN Stopped by: Dorita Sciara, MD   benzonatate 100 MG capsule Commonly known as: TESSALON Stopped by: Dorita Sciara, MD   predniSONE 20 MG tablet Commonly known as: DELTASONE Stopped by: Dorita Sciara, MD   promethazine-dextromethorphan 6.25-15 MG/5ML syrup Commonly known as: PROMETHAZINE-DM Stopped by: Dorita Sciara, MD       TAKE these medications    atorvastatin 10 MG tablet Commonly known as: LIPITOR Take 1 tablet (10 mg total) by mouth daily.   B-12 50 MCG Tabs Take 1 tablet by mouth daily.   blood glucose meter kit and supplies Kit Dispense based on patient and insurance preference. Use once daily before breakfast. E11.65   levothyroxine 200 MCG tablet Commonly known as: SYNTHROID Take 1 tablet (200 mcg total) by mouth as directed. Schedule appt with endocrinology for additional refills What changed:  how much to  take additional instructions   lisinopril 10 MG tablet Commonly known as: ZESTRIL Take 1 tablet (10 mg total) by mouth daily.   metFORMIN 850 MG tablet Commonly known as: GLUCOPHAGE Take 1 tablet by mouth twice daily with food   onetouch ultrasoft lancets Check blood sugar 2 times daily   OneTouch Verio test strip Generic drug: glucose blood 1 each by Other route in the morning and at bedtime. Use as instructed   Rybelsus 7 MG Tabs Generic drug: Semaglutide Take 7 mg by mouth daily.   Vitamin D3 50 MCG (2000 UT) Chew Chew 1 tablet by mouth daily at  6 (six) AM.         ALLERGIES: Allergies  Allergen Reactions   Amlodipine Other (See Comments)    Headache with '5mg'$    Orange Fruit [Citrus]     migrains--headache   Latex Rash    OBJECTIVE:   VITAL SIGNS: BP 110/70 (BP Location: Left Arm, Patient Position: Sitting, Cuff Size: Large)   Pulse 65   Ht '5\' 7"'$  (1.702 m)   Wt (!) 306 lb (138.8 kg)   LMP 10/22/2021   SpO2 99%   BMI 47.93 kg/m    PHYSICAL EXAM:  General: Pt appears well and is in NAD  Neck: General: Supple without adenopathy or carotid bruits. Thyroid: Thyroid size normal.  No goiter or nodules appreciated.  Lungs: Clear with good BS bilat   Heart: RRR   Abdomen: soft, nontender  Extremities:  Lower extremities - No pretibial edema.   Neuro: MS is good with appropriate affect, pt is alert and Ox3   DM Foot Exam 03/06/2021 The skin of the feet is intact without sores or ulcerations. The pedal pulses are 2+ on right and 2+ on left. The sensation is intact to a screening 5.07, 10 gram monofilament bilaterally  DATA REVIEWED:  Lab Results  Component Value Date   HGBA1C 8.2 (A) 01/15/2022   HGBA1C 6.1 (A) 07/15/2021   HGBA1C 6.7 (A) 03/06/2021     Latest Reference Range & Units 01/15/22 08:27  TSH 0.35 - 5.50 uIU/mL 0.06 (L)  (L): Data is abnormally low     ASSESSMENT / PLAN / RECOMMENDATIONS:   1) Type 2 Diabetes Mellitus, poorly  controlled, Without complications - Most recent A1c of 8.2%. Goal A1c < 7.0 %.    -Unfortunately she has been noted with worsening glycemic control, this is due to variable reasons, discontinuation of glipizide, being sick for a long time, glucocorticoid's x 2 -Glipizide 5 mg was causing hypoglycemia, I have opted to start glimepiride 1 mg instead -Will increase Rybelsus   MEDICATIONS: Start glimepiride 1 mg daily Increase Rybelsus 14 mg , 1 tablet Before Breakfast  Continue metformin 850 mg , 1 tablet twice a day before meals   EDUCATION / INSTRUCTIONS: BG monitoring instructions: Patient is instructed to check her blood sugars 1 times a day, fasting . Call [facility] if: BG persistently < 70  I reviewed the Rule of 15 for the treatment of hypoglycemia in detail with the patient. Literature supplied.   2) Diabetic complications:  Eye: Does not have known diabetic retinopathy.  Neuro/ Feet: Does not have known diabetic peripheral neuropathy. Renal: Patient does not have known baseline CKD. She is  on an ACEI/ARB at present.   3) Hypothyroidism:   - TSH continues to be low, will reduce levothyroxine as below  Medication  Stop levothyroxine 200 mcg , daily Start levothyroxine 175 mcg daily    4) Dyslipidemia:    -LDL and triglycerides have been normal    Medication  Continue atorvastatin 10 mg daily    5) Chronic URI symptoms    - Pt to use antihistamines  -If symptoms do not resolve I have advised the patient to have a follow-up with PCP as she may need further workup given the longevity of her symptoms    F/U in 4 months   Signed electronically by: Mack Guise, MD  [facility]  [address] [address] [place], [place] [address] Phone: [phone] FAX: [phone]   CC: Jeanie Sewer, NP  [place] [address] Phone: [phone]  Fax:  [phone]    Return to Endocrinology clinic as below: No future appointments.

## 2022-01-15 ENCOUNTER — Ambulatory Visit: Payer: BC Managed Care – PPO | Admitting: Internal Medicine

## 2022-01-15 ENCOUNTER — Encounter: Payer: Self-pay | Admitting: Internal Medicine

## 2022-01-15 VITALS — BP 110/70 | HR 65 | Ht 67.0 in | Wt 306.0 lb

## 2022-01-15 DIAGNOSIS — E1165 Type 2 diabetes mellitus with hyperglycemia: Secondary | ICD-10-CM

## 2022-01-15 DIAGNOSIS — E785 Hyperlipidemia, unspecified: Secondary | ICD-10-CM

## 2022-01-15 DIAGNOSIS — E039 Hypothyroidism, unspecified: Secondary | ICD-10-CM

## 2022-01-15 DIAGNOSIS — Z794 Long term (current) use of insulin: Secondary | ICD-10-CM

## 2022-01-15 LAB — POCT GLYCOSYLATED HEMOGLOBIN (HGB A1C): Hemoglobin A1C: 8.2 % — AB (ref 4.0–5.6)

## 2022-01-15 LAB — TSH: TSH: 0.06 u[IU]/mL — ABNORMAL LOW (ref 0.35–5.50)

## 2022-01-15 MED ORDER — GLIMEPIRIDE 1 MG PO TABS: 1.0000 mg | ORAL_TABLET | Freq: Every day | ORAL | 3 refills | Status: DC

## 2022-01-15 MED ORDER — RYBELSUS 14 MG PO TABS: 14.0000 mg | ORAL_TABLET | Freq: Every day | ORAL | 3 refills | Status: DC

## 2022-01-15 MED ORDER — LEVOTHYROXINE SODIUM 175 MCG PO TABS: 175.0000 ug | ORAL_TABLET | Freq: Every day | ORAL | 3 refills | Status: DC

## 2022-01-15 NOTE — Patient Instructions (Signed)
-   Start Glimepiride 1 mg, 1 tablet  - Increase  Rybelsus 14 mg , 1 tablet Before Breakfast - Continue Metformin 850 mg , 1 tablet twice a day     HOW TO TREAT LOW BLOOD SUGARS (Blood sugar LESS THAN 70 MG/DL) Please follow the RULE OF 15 for the treatment of hypoglycemia treatment (when your (blood sugars are less than 70 mg/dL)   STEP 1: Take 15 grams of carbohydrates when your blood sugar is low, which includes:  3-4 GLUCOSE TABS  OR 3-4 OZ OF JUICE OR REGULAR SODA OR ONE TUBE OF GLUCOSE GEL    STEP 2: RECHECK blood sugar in 15 MINUTES STEP 3: If your blood sugar is still low at the 15 minute recheck --> then, go back to STEP 1 and treat AGAIN with another 15 grams of carbohydrates.

## 2022-01-22 ENCOUNTER — Encounter: Payer: Self-pay | Admitting: Family

## 2022-01-22 ENCOUNTER — Encounter: Payer: Self-pay | Admitting: Internal Medicine

## 2022-01-22 ENCOUNTER — Ambulatory Visit: Payer: BC Managed Care – PPO | Admitting: Family

## 2022-01-22 VITALS — BP 123/78 | HR 92 | Temp 98.4°F | Ht 67.0 in | Wt 305.1 lb

## 2022-01-22 DIAGNOSIS — J069 Acute upper respiratory infection, unspecified: Secondary | ICD-10-CM

## 2022-01-22 NOTE — Progress Notes (Signed)
Patient ID: Marne Meline, female    DOB: [birth year], 52 y.o.   MRN: [id number]  Chief Complaint  Patient presents with   Follow-up    ED follow up from 11/05, 12/27 for URI, Pt c/o cough, fatigue and sinus drainage for 2 months. Pt has been to UC twice and was given antibiotics which did not help.     HPI:      URI sx:  seen in UC on 11/5 - strep neg - seen again 12/27 and covid neg, given pred & cough syrup & Augmentin. Cough is better, but still having a lot of postnasal drip. Seen by Endocrinologist w/c/o of overall not feeling well, mood swings. pt concerned if perimenopausal sx, unsure if just d/t being sick for so long. States Endo restarted her Rybelsus, '14mg'$ , she is going to start today.      Assessment & Plan:  1. Upper respiratory tract infection, unspecified type - checking cbc, rule out continued bacterial infection, electrolytes, kidney fx d/t still not feeling well, lightheaded/dizzy. Advised to restart OTC antihistamine to reduce postnasal drip possibly contributing to cough, also can add steroid nasal spray if pill not enough, 1 spray each side qd. Continue drinking plenty of fluids.  - CBC with Differential/Platelet - Basic Metabolic Panel (BMET)   Subjective:    Outpatient Medications Prior to Visit  Medication Sig Dispense Refill   atorvastatin (LIPITOR) 10 MG tablet Take 1 tablet (10 mg total) by mouth daily. 90 tablet 3   blood glucose meter kit and supplies KIT Dispense based on patient and insurance preference. Use once daily before breakfast. E11.65 1 each 0   Cholecalciferol (VITAMIN D3) 50 MCG (2000 UT) CHEW Chew 1 tablet by mouth daily at 6 (six) AM.     Cyanocobalamin (B-12) 50 MCG TABS Take 1 tablet by mouth daily.     glimepiride (AMARYL) 1 MG tablet Take 1 tablet (1 mg total) by mouth daily with breakfast. 90 tablet 3   glucose blood (ONETOUCH VERIO) test strip 1 each by Other route in the morning and at bedtime. Use as instructed 200 each 3   Lancets  (ONETOUCH ULTRASOFT) lancets Check blood sugar 2 times daily 100 each 12   levothyroxine (SYNTHROID) 175 MCG tablet Take 1 tablet (175 mcg total) by mouth daily. 90 tablet 3   lisinopril (ZESTRIL) 10 MG tablet Take 1 tablet (10 mg total) by mouth daily. 90 tablet 3   metFORMIN (GLUCOPHAGE) 850 MG tablet Take 1 tablet by mouth twice daily with food 180 tablet 1   Semaglutide (RYBELSUS) 14 MG TABS Take 1 tablet (14 mg total) by mouth daily. 90 tablet 3   No facility-administered medications prior to visit.   Past Medical History:  Diagnosis Date   Anemia    Anxiety    Arthritis    Cancer ([place])    Chronic kidney disease    Depression    Diabetes ([place])    Dyslipidemia 12/17/2020   Fatty liver    Headache    Hyperlipidemia 03/22/2018   Hypertension    Hypothyroidism    Hypothyroidism 03/22/2018   Migraines    Thyroid disease    UTI (urinary tract infection)    Past Surgical History:  Procedure Laterality Date   ABLATION     ROBOT ASSISTED LAPAROSCOPIC NEPHRECTOMY Right 03/26/2021   Procedure: XI ROBOTIC ASSISTED LAPAROSCOPIC NEPHRECTOMY;  Surgeon: Ceasar Mons, MD;  Location: [facility];  Service: Urology;  Laterality: Right;   UTERINE FIBROID SURGERY  2003   Allergies  Allergen Reactions   Amlodipine Other (See Comments)    Headache with '5mg'$    Orange Fruit [Citrus]     migrains--headache   Latex Rash      Objective:    Physical Exam Vitals and nursing note reviewed.  Constitutional:      Appearance: Normal appearance. She is obese.  HENT:     Right Ear: Tympanic membrane and ear canal normal.     Left Ear: Tympanic membrane and ear canal normal.     Mouth/Throat:     Mouth: Mucous membranes are moist.     Pharynx: Oropharyngeal exudate present. No pharyngeal swelling, posterior oropharyngeal erythema or uvula swelling.     Tonsils: No tonsillar exudate or tonsillar abscesses.  Cardiovascular:     Rate and Rhythm: Normal rate and regular rhythm.   Pulmonary:     Effort: Pulmonary effort is normal.     Breath sounds: Normal breath sounds.  Musculoskeletal:        General: Normal range of motion.  Lymphadenopathy:     Head:     Right side of head: No preauricular, posterior auricular or occipital adenopathy.     Left side of head: No preauricular, posterior auricular or occipital adenopathy.     Cervical: No cervical adenopathy.  Skin:    General: Skin is warm and dry.  Neurological:     Mental Status: She is alert.  Psychiatric:        Mood and Affect: Mood normal.        Behavior: Behavior normal.    BP 123/78 (BP Location: Left Arm, Patient Position: Sitting, Cuff Size: Large)   Pulse 92   Temp 98.4 F (36.9 C) (Temporal)   Ht '5\' 7"'$  (1.702 m)   Wt (!) 305 lb 2 oz (138.4 kg)   LMP  (LMP Unknown)   SpO2 96%   BMI 47.79 kg/m  Wt Readings from Last 3 Encounters:  01/22/22 (!) 305 lb 2 oz (138.4 kg)  01/15/22 (!) 306 lb (138.8 kg)  08/19/21 297 lb 3.2 oz (134.8 kg)       Jeanie Sewer, NP

## 2022-01-23 ENCOUNTER — Other Ambulatory Visit: Payer: Self-pay

## 2022-01-23 LAB — CBC WITH DIFFERENTIAL/PLATELET
Basophils Absolute: 0.1 10*3/uL (ref 0.0–0.1)
Basophils Relative: 0.8 % (ref 0.0–3.0)
Eosinophils Absolute: 0.1 10*3/uL (ref 0.0–0.7)
Eosinophils Relative: 1.8 % (ref 0.0–5.0)
HCT: 35.9 % — ABNORMAL LOW (ref 36.0–46.0)
Hemoglobin: 11.9 g/dL — ABNORMAL LOW (ref 12.0–15.0)
Lymphocytes Relative: 27.5 % (ref 12.0–46.0)
Lymphs Abs: 2.1 10*3/uL (ref 0.7–4.0)
MCHC: 33.2 g/dL (ref 30.0–36.0)
MCV: 85.8 fl (ref 78.0–100.0)
Monocytes Absolute: 0.6 10*3/uL (ref 0.1–1.0)
Monocytes Relative: 8.2 % (ref 3.0–12.0)
Neutro Abs: 4.7 10*3/uL (ref 1.4–7.7)
Neutrophils Relative %: 61.7 % (ref 43.0–77.0)
Platelets: 243 10*3/uL (ref 150.0–400.0)
RBC: 4.19 Mil/uL (ref 3.87–5.11)
RDW: 14 % (ref 11.5–15.5)
WBC: 7.6 10*3/uL (ref 4.0–10.5)

## 2022-01-23 LAB — BASIC METABOLIC PANEL
BUN: 26 mg/dL — ABNORMAL HIGH (ref 6–23)
CO2: 24 mEq/L (ref 19–32)
Calcium: 9 mg/dL (ref 8.4–10.5)
Chloride: 100 mEq/L (ref 96–112)
Creatinine, Ser: 1.22 mg/dL — ABNORMAL HIGH (ref 0.40–1.20)
GFR: 51.86 mL/min — ABNORMAL LOW (ref >60.00)
Glucose, Bld: 254 mg/dL — ABNORMAL HIGH (ref 70–99)
Potassium: 4.6 mEq/L (ref 3.5–5.1)
Sodium: 137 mEq/L (ref 135–145)

## 2022-01-23 MED ORDER — RYBELSUS 14 MG PO TABS: 14.0000 mg | ORAL_TABLET | Freq: Every day | ORAL | 3 refills | Status: DC

## 2022-01-25 NOTE — Progress Notes (Signed)
No infection in your blood count, but your glucose is high, I know you had been on steroids & not feeling well. Discuss this with Dr. Kelton Pillar, but you may need to increase your Glimeperide to '2mg'$  daily for the next week or 2, let her know if your blood sugar readings are reading in the 200s. But I know you were also restarting the Rybelsus, so this will help.  Kidney function down slightly, most likely due to higher blood sugars, a reminder to  drink up to 64oz water daily, which I think you do. No NSAIDs.

## 2022-02-06 DIAGNOSIS — C641 Malignant neoplasm of right kidney, except renal pelvis: Secondary | ICD-10-CM | POA: Diagnosis not present

## 2022-02-10 DIAGNOSIS — C641 Malignant neoplasm of right kidney, except renal pelvis: Secondary | ICD-10-CM | POA: Diagnosis not present

## 2022-02-10 DIAGNOSIS — C649 Malignant neoplasm of unspecified kidney, except renal pelvis: Secondary | ICD-10-CM | POA: Diagnosis not present

## 2022-02-10 DIAGNOSIS — K769 Liver disease, unspecified: Secondary | ICD-10-CM | POA: Diagnosis not present

## 2022-02-10 DIAGNOSIS — R911 Solitary pulmonary nodule: Secondary | ICD-10-CM | POA: Diagnosis not present

## 2022-03-26 DIAGNOSIS — C641 Malignant neoplasm of right kidney, except renal pelvis: Secondary | ICD-10-CM | POA: Diagnosis not present

## 2022-04-24 DIAGNOSIS — J0100 Acute maxillary sinusitis, unspecified: Secondary | ICD-10-CM | POA: Diagnosis not present

## 2022-05-02 ENCOUNTER — Other Ambulatory Visit: Payer: Self-pay | Admitting: Family

## 2022-05-02 DIAGNOSIS — I10 Essential (primary) hypertension: Secondary | ICD-10-CM

## 2022-05-29 ENCOUNTER — Ambulatory Visit: Payer: BC Managed Care – PPO | Admitting: Internal Medicine

## 2022-05-29 ENCOUNTER — Encounter: Payer: Self-pay | Admitting: Internal Medicine

## 2022-05-29 VITALS — BP 124/78 | HR 80 | Ht 67.0 in | Wt 305.0 lb

## 2022-05-29 DIAGNOSIS — Z794 Long term (current) use of insulin: Secondary | ICD-10-CM

## 2022-05-29 DIAGNOSIS — E785 Hyperlipidemia, unspecified: Secondary | ICD-10-CM

## 2022-05-29 DIAGNOSIS — E1122 Type 2 diabetes mellitus with diabetic chronic kidney disease: Secondary | ICD-10-CM

## 2022-05-29 DIAGNOSIS — E039 Hypothyroidism, unspecified: Secondary | ICD-10-CM | POA: Diagnosis not present

## 2022-05-29 DIAGNOSIS — N1831 Chronic kidney disease, stage 3a: Secondary | ICD-10-CM

## 2022-05-29 DIAGNOSIS — E1165 Type 2 diabetes mellitus with hyperglycemia: Secondary | ICD-10-CM

## 2022-05-29 LAB — TSH: TSH: 5.41 u[IU]/mL (ref 0.35–5.50)

## 2022-05-29 LAB — LIPID PANEL
Cholesterol: 205 mg/dL — ABNORMAL HIGH (ref 0–200)
HDL: 36.1 mg/dL — ABNORMAL LOW (ref >39.00)
LDL Cholesterol: 134 mg/dL — ABNORMAL HIGH (ref 0–99)
NonHDL: 169.18
Total CHOL/HDL Ratio: 6
Triglycerides: 174 mg/dL — ABNORMAL HIGH (ref 0.0–149.0)
VLDL: 34.8 mg/dL (ref 0.0–40.0)

## 2022-05-29 LAB — MICROALBUMIN / CREATININE URINE RATIO
Creatinine,U: 104.7 mg/dL
Microalb Creat Ratio: 1 mg/g (ref 0.0–30.0)
Microalb, Ur: 1 mg/dL (ref 0.0–1.9)

## 2022-05-29 LAB — BASIC METABOLIC PANEL
BUN: 22 mg/dL (ref 6–23)
CO2: 24 mEq/L (ref 19–32)
Calcium: 9.5 mg/dL (ref 8.4–10.5)
Chloride: 104 mEq/L (ref 96–112)
Creatinine, Ser: 1.1 mg/dL (ref 0.40–1.20)
GFR: 58.58 mL/min — ABNORMAL LOW (ref >60.00)
Glucose, Bld: 113 mg/dL — ABNORMAL HIGH (ref 70–99)
Potassium: 4.9 mEq/L (ref 3.5–5.1)
Sodium: 138 mEq/L (ref 135–145)

## 2022-05-29 LAB — POCT GLYCOSYLATED HEMOGLOBIN (HGB A1C): Hemoglobin A1C: 7.2 % — AB (ref 4.0–5.6)

## 2022-05-29 LAB — POCT GLUCOSE (DEVICE FOR HOME USE): Glucose Fasting, POC: 169 mg/dL — AB (ref 70–99)

## 2022-05-29 MED ORDER — ATORVASTATIN CALCIUM 10 MG PO TABS: 10.0000 mg | ORAL_TABLET | Freq: Every day | ORAL | 3 refills | Status: DC

## 2022-05-29 NOTE — Patient Instructions (Signed)
-   Continue Glimepiride 1 mg, 1 tablet  - Continue  Rybelsus 14 mg , 1 tablet Before Breakfast - Continue Metformin 850 mg , 1 tablet twice a day     HOW TO TREAT LOW BLOOD SUGARS (Blood sugar LESS THAN 70 MG/DL) Please follow the RULE OF 15 for the treatment of hypoglycemia treatment (when your (blood sugars are less than 70 mg/dL)   STEP 1: Take 15 grams of carbohydrates when your blood sugar is low, which includes:  3-4 GLUCOSE TABS  OR 3-4 OZ OF JUICE OR REGULAR SODA OR ONE TUBE OF GLUCOSE GEL    STEP 2: RECHECK blood sugar in 15 MINUTES STEP 3: If your blood sugar is still low at the 15 minute recheck --> then, go back to STEP 1 and treat AGAIN with another 15 grams of carbohydrates.

## 2022-05-29 NOTE — Progress Notes (Signed)
Name: Mary Moyer  MRN/ DOB: [id number], August 11, 1970   Age/ Sex: 52 y.o., female    PCP: Dulce Sellar, NP   Reason for Endocrinology Evaluation: Type 2 Diabetes Mellitus     Date of Initial Endocrinology Visit: 12/16/2020    PATIENT IDENTIFIER: Mary Moyer is a 52 y.o. female with a past medical history of T2DM, Hypothyroidism and HTN, S/P right nephrectomy due to renal cell carcinoma (03/2021) . the patient presented for initial endocrinology clinic visit on 12/16/2020 for consultative assistance with her diabetes management.    HPI: Mary Moyer was    Diagnosed with DM 2020 Prior Medications tried/Intolerance: Glipizide, Metformin             Hemoglobin A1c has ranged from 7.4% in 2020, peaking at 11.5% in 2021.   On her initial visit to our clinic she had an A1c of 8.8%, she needed clearance for right nephrectomy after optimization of glucose control.  We started Rybelsus, continue glipizide and metformin  She was also started on atorvastatin 10 mg with an LDL of 128 mg/DL  Stopped glipizide 04/979 with an A1c of 6.1%,  Started glimepiride 01/2022 due to A1c 8.2%   THYROID HISTORY: She has been diagnosed with hypothyroidism in 2000. Denies prior neck sx or RAI ablation    Denies local neck symptoms Denies constipation   No FH of thyroid  Father with T2DM     SUBJECTIVE:   During the last visit (01/15/2022): A1c 8.2%    Today (05/29/22): Mary Moyer is here for a followup on diabetes management. She checks her  blood sugars 1 times daily , but has misplaced her meter recently.    She is S/P right renal mass resection 03/2021 with pathology report consistent with clear cell renal carcinoma, continues with nephrology follow-up   Denies recent nausea or vomiting  Has alternating constipation with diarrhea  Denies local neck swelling  Denies palpitations   HOME DIABETES REGIMEN: Metformin 850 mg  BID Glimepiride 1 mg daily Rybelsus 14  mg  daily Levothyroxine 175 mcg, daily  Atorvastatin 10 mg daily    Statin: yes  ACE-I/ARB: yes Prior Diabetic Education: no     METER DOWNLOAD SUMMARY: 148- 220   DIABETIC COMPLICATIONS: Microvascular complications:   Denies: CKD, retinopathy , neuropathy  Last eye exam: Completed 2021  Macrovascular complications:   Denies: CAD, PVD, CVA   PAST HISTORY: Past Medical History:  Past Medical History:  Diagnosis Date   Anemia    Anxiety    Arthritis    Cancer (HCC)    Chronic kidney disease    Depression    Diabetes (HCC)    Dyslipidemia 12/17/2020   Fatty liver    Headache    Hyperlipidemia 03/22/2018   Hypertension    Hypothyroidism    Hypothyroidism 03/22/2018   Migraines    Thyroid disease    UTI (urinary tract infection)    Past Surgical History:  Past Surgical History:  Procedure Laterality Date   ABLATION     ROBOT ASSISTED LAPAROSCOPIC NEPHRECTOMY Right 03/26/2021   Procedure: XI ROBOTIC ASSISTED LAPAROSCOPIC NEPHRECTOMY;  Surgeon: Rene Paci, MD;  Location: [facility];  Service: Urology;  Laterality: Right;   UTERINE FIBROID SURGERY  2003    Social History:  reports that she has never smoked. She has never used smokeless tobacco. She reports that she does not currently use alcohol. She reports that she does not use drugs. Family History:  Family History  Problem Relation Age  of Onset   Kidney disease Mother    Arthritis Mother    Hypertension Mother    Miscarriages / [place] Mother    Depression Mother    Diabetes Father    Heart attack Father    Heart disease Father    Hyperlipidemia Father    Hypertension Father    Depression Sister    Heart disease Sister    Hypertension Sister    Asthma Daughter    Depression Daughter    Diabetes Daughter    Hypertension Daughter    Miscarriages / [place] Daughter    Kidney disease Maternal Grandmother    Miscarriages / Stillbirths Maternal Grandmother    Hypertension Paternal  Grandmother    Heart attack Paternal Grandmother    Cancer Paternal Grandfather    Diabetes Paternal Grandfather    Hypertension Paternal Grandfather    Colon cancer Neg Hx    Esophageal cancer Neg Hx    Pancreatic cancer Neg Hx    Stomach cancer Neg Hx    Breast cancer Neg Hx      HOME MEDICATIONS: Allergies as of 05/29/2022       Reactions   Amlodipine Other (See Comments)   Headache with 5mg    Orange Fruit [citrus]    migrains--headache   Latex Rash        Medication List        Accurate as of May 29, 2022  8:28 AM. If you have any questions, ask your nurse or doctor.          atorvastatin 10 MG tablet Commonly known as: LIPITOR Take 1 tablet (10 mg total) by mouth daily.   B-12 50 MCG Tabs Take 1 tablet by mouth daily.   blood glucose meter kit and supplies Kit Dispense based on patient and insurance preference. Use once daily before breakfast. E11.65   glimepiride 1 MG tablet Commonly known as: AMARYL Take 1 tablet (1 mg total) by mouth daily with breakfast.   levothyroxine 175 MCG tablet Commonly known as: SYNTHROID Take 1 tablet (175 mcg total) by mouth daily.   lisinopril 10 MG tablet Commonly known as: ZESTRIL Take 1 tablet by mouth once daily   metFORMIN 850 MG tablet Commonly known as: GLUCOPHAGE Take 1 tablet by mouth twice daily with food   onetouch ultrasoft lancets Check blood sugar 2 times daily   OneTouch Verio test strip Generic drug: glucose blood 1 each by Other route in the morning and at bedtime. Use as instructed   Rybelsus 14 MG Tabs Generic drug: Semaglutide Take 1 tablet (14 mg total) by mouth daily.   Vitamin D3 50 MCG (2000 UT) Chew Chew 1 tablet by mouth daily at 6 (six) AM.         ALLERGIES: Allergies  Allergen Reactions   Amlodipine Other (See Comments)    Headache with 5mg    Orange Fruit [Citrus]     migrains--headache   Latex Rash    OBJECTIVE:   VITAL SIGNS: BP 124/78 (BP Location: Left Arm,  Patient Position: Sitting, Cuff Size: Large)   Pulse 80   Ht 5\' 7"  (1.702 m)   Wt (!) 305 lb (138.3 kg)   SpO2 99%   BMI 47.77 kg/m    PHYSICAL EXAM:  General: Pt appears well and is in NAD  Neck: General: Supple without adenopathy or carotid bruits. Thyroid: Thyroid size normal.  No goiter or nodules appreciated.  Lungs: Clear with good BS bilat   Heart: RRR  Abdomen: soft, nontender  Extremities:  Lower extremities - No pretibial edema.   Neuro: MS is good with appropriate affect, pt is alert and Ox3   DM Foot Exam 05/29/2022 The skin of the feet is intact without sores or ulcerations. The pedal pulses are 2+ on right and 2+ on left. The sensation is intact to a screening 5.07, 10 gram monofilament bilaterally  DATA REVIEWED:  Lab Results  Component Value Date   HGBA1C 7.2 (A) 05/29/2022   HGBA1C 8.2 (A) 01/15/2022   HGBA1C 6.1 (A) 07/15/2021    Latest Reference Range & Units 01/22/22 16:33  Sodium 135 - 145 mEq/L 137  Potassium 3.5 - 5.1 mEq/L 4.6  Chloride 96 - 112 mEq/L 100  CO2 19 - 32 mEq/L 24  Glucose 70 - 99 mg/dL 161 (H)  BUN 6 - 23 mg/dL 26 (H)  Creatinine 0.96 - 1.20 mg/dL 0.45 (H)  Calcium 8.4 - 10.5 mg/dL 9.0  GFR >40.98 mL/min 51.86 (L)    Latest Reference Range & Units 05/29/22 08:40  Sodium 135 - 145 mEq/L 138  Potassium 3.5 - 5.1 mEq/L 4.9  Chloride 96 - 112 mEq/L 104  CO2 19 - 32 mEq/L 24  Glucose 70 - 99 mg/dL 119 (H)  BUN 6 - 23 mg/dL 22  Creatinine 1.47 - 8.29 mg/dL 5.62  Calcium 8.4 - 13.0 mg/dL 9.5  GFR >86.57 mL/min 58.58 (L)  Total CHOL/HDL Ratio  6  Cholesterol 0 - 200 mg/dL 846 (H)  HDL Cholesterol >39.00 mg/dL 96.29 (L)  LDL (calc) 0 - 99 mg/dL 528 (H)  NonHDL  413.24  Triglycerides 0.0 - 149.0 mg/dL 401.0 (H)  VLDL 0.0 - 27.2 mg/dL 53.6    Latest Reference Range & Units 05/29/22 08:40  TSH 0.35 - 5.50 uIU/mL 5.41      ASSESSMENT / PLAN / RECOMMENDATIONS:   1) Type 2 Diabetes Mellitus, Optimally  controlled, With CKD  III complications - Most recent A1c of 7.2 %. Goal A1c < 7.0 %.    - A1c improved from 8.2% to 7.2%  - NO changes at this time  - Discussed SGLT-1 inhibitors, renal benefits as well as risk of genital infections   MEDICATIONS: Continue glimepiride 1 mg daily Continue Rybelsus 14 mg , 1 tablet Before Breakfast  Continue metformin 850 mg , 1 tablet twice a day before meals   EDUCATION / INSTRUCTIONS: BG monitoring instructions: Patient is instructed to check her blood sugars 1 times a day, fasting . Call [place] [facility] if: BG persistently < 70  I reviewed the Rule of 15 for the treatment of hypoglycemia in detail with the patient. Literature supplied.   2) Diabetic complications:  Eye: Does not have known diabetic retinopathy.  Neuro/ Feet: Does not have known diabetic peripheral neuropathy. Renal: Patient does not have known baseline CKD. She is  on an ACEI/ARB at present.   3) Hypothyroidism:   -TSH was suppressed at 0.06 u IU/mL on levothyroxine 200 mcg, TSH increased to 5.41 MCG on levothyroxine 175 -I would not change the dose at this time, will encourage compliance, I will ask the patient to obtain a pillbox  Medication  Levothyroxine 175 mcg daily    4) Dyslipidemia:    - She has not been taking statins - Discussed cardiovascular benefits of statin therapy  -TG, and LDL are above goal, patient will be encouraged to restart atorvastatin  Medication  Restart atorvastatin 10 mg daily       F/U in  6 months   Signed electronically by: Lyndle Herrlich, MD  [facility]  [facility] [address] [place], [place] [address] Phone: [phone] FAX: [phone]   CC: Dulce Sellar, NP [address] [place] [address] Phone: [phone]  Fax: [phone]    Return to Endocrinology clinic as below: Future Appointments  Date Time Provider Department Center  12/07/2022  7:30 AM  Merryl Buckels, Konrad Dolores, MD [facility] None

## 2022-06-02 ENCOUNTER — Encounter: Payer: Self-pay | Admitting: Internal Medicine

## 2022-06-17 ENCOUNTER — Encounter: Payer: Self-pay | Admitting: Internal Medicine

## 2022-06-18 ENCOUNTER — Other Ambulatory Visit: Payer: Self-pay

## 2022-06-18 MED ORDER — ONETOUCH VERIO VI STRP: 1.0000 | ORAL_STRIP | Freq: Two times a day (BID) | 3 refills | Status: AC

## 2022-06-30 ENCOUNTER — Other Ambulatory Visit: Payer: Self-pay | Admitting: Internal Medicine

## 2022-07-27 ENCOUNTER — Other Ambulatory Visit: Payer: Self-pay | Admitting: Family

## 2022-07-27 DIAGNOSIS — I10 Essential (primary) hypertension: Secondary | ICD-10-CM

## 2022-08-07 ENCOUNTER — Other Ambulatory Visit: Payer: Self-pay | Admitting: Family

## 2022-08-07 DIAGNOSIS — I10 Essential (primary) hypertension: Secondary | ICD-10-CM

## 2022-08-13 DIAGNOSIS — F331 Major depressive disorder, recurrent, moderate: Secondary | ICD-10-CM | POA: Diagnosis not present

## 2022-09-01 DIAGNOSIS — F331 Major depressive disorder, recurrent, moderate: Secondary | ICD-10-CM | POA: Diagnosis not present

## 2022-09-17 DIAGNOSIS — F331 Major depressive disorder, recurrent, moderate: Secondary | ICD-10-CM | POA: Diagnosis not present

## 2022-09-24 NOTE — Progress Notes (Signed)
Phone [phone]  Subjective:   Patient is a 52 y.o. female presenting for annual physical.    Chief Complaint  Patient presents with   Annual Exam    Fasting w/ labs    See problem oriented charting- ROS- full  review of systems was completed and negative.  The following were reviewed and entered/updated in epic: Past Medical History:  Diagnosis Date   Anemia    Anxiety    Arthritis    Cancer (HCC)    Chronic kidney disease    Depression    Diabetes (HCC)    Dyslipidemia 12/17/2020   Fatty liver    Headache    Hyperlipidemia 03/22/2018   Hypertension    Hypothyroidism    Hypothyroidism 03/22/2018   Migraines    Thyroid disease    UTI (urinary tract infection)    Patient Active Problem List   Diagnosis Date Noted   History of hyperlipidemia, mixed 09/25/2022   Rosacea 09/25/2022   HSV-1 (herpes simplex virus 1) infection 09/25/2022   Renal mass 03/26/2021   Hyperlipidemia due to type 2 diabetes mellitus (HCC) 02/24/2021   Type 2 diabetes mellitus with hyperglycemia, without long-term current use of insulin (HCC) 02/24/2021   Insomnia due to medical condition 02/24/2021   Vitamin D deficiency 12/17/2020   Acquired hypothyroidism 12/16/2020   Renal mass, right 06/27/2020   Elevated LFTs 07/31/2019   Hx of endometriosis 06/03/2018   Iron deficiency anemia due to chronic blood loss 05/06/2018   Depression, major, single episode, severe (HCC) 03/30/2018   Diuretic-induced hypokalemia 03/30/2018   GAD (generalized anxiety disorder) 03/30/2018   Essential hypertension 03/22/2018   Morbid obesity (HCC) 03/22/2018   History of PCOS 03/22/2018   Past Surgical History:  Procedure Laterality Date   ABLATION     ROBOT ASSISTED LAPAROSCOPIC NEPHRECTOMY Right 03/26/2021   Procedure: XI ROBOTIC ASSISTED LAPAROSCOPIC NEPHRECTOMY;  Surgeon: Rene Paci, MD;  Location: [facility];  Service: Urology;  Laterality: Right;   UTERINE FIBROID SURGERY  2003     Family History  Problem Relation Age of Onset   Kidney disease Mother    Arthritis Mother    Hypertension Mother    Miscarriages / [place] Mother    Depression Mother    Diabetes Father    Heart attack Father    Heart disease Father    Hyperlipidemia Father    Hypertension Father    Depression Sister    Heart disease Sister    Hypertension Sister    Asthma Daughter    Depression Daughter    Diabetes Daughter    Hypertension Daughter    Miscarriages / [place] Daughter    Kidney disease Maternal Grandmother    Miscarriages / Stillbirths Maternal Grandmother    Hypertension Paternal Grandmother    Heart attack Paternal Grandmother    Cancer Paternal Grandfather    Diabetes Paternal Grandfather    Hypertension Paternal Grandfather    Colon cancer Neg Hx    Esophageal cancer Neg Hx    Pancreatic cancer Neg Hx    Stomach cancer Neg Hx    Breast cancer Neg Hx     Medications- reviewed and updated Current Outpatient Medications  Medication Sig Dispense Refill   blood glucose meter kit and supplies KIT Dispense based on patient and insurance preference. Use once daily before breakfast. E11.65 1 each 0   Cholecalciferol (VITAMIN D3) 50 MCG (2000 UT) CHEW Chew 1 tablet by mouth daily at 6 (six) AM.  Cyanocobalamin (B-12) 50 MCG TABS Take 1 tablet by mouth daily.     glimepiride (AMARYL) 1 MG tablet Take 1 tablet (1 mg total) by mouth daily with breakfast. 90 tablet 3   glucose blood (ONETOUCH VERIO) test strip 1 each by Other route in the morning and at bedtime. Use as instructed 200 each 3   Lancets (ONETOUCH ULTRASOFT) lancets Check blood sugar 2 times daily 100 each 12   levothyroxine (SYNTHROID) 175 MCG tablet Take 1 tablet (175 mcg total) by mouth daily. 90 tablet 3   metFORMIN (GLUCOPHAGE) 850 MG tablet Take 1 tablet by mouth twice daily with food 180 tablet 0   metroNIDAZOLE (METROGEL) 1 % gel Apply topically daily. Apply to face one time daily. 45 g 0    Semaglutide (RYBELSUS) 14 MG TABS Take 1 tablet (14 mg total) by mouth daily. 90 tablet 3   valACYclovir (VALTREX) 1000 MG tablet Take 2 tablets (2,000 mg total) by mouth 2 (two) times daily. Take for 1 day at first sign of tingling. 20 tablet 1   atorvastatin (LIPITOR) 10 MG tablet Take 1 tablet (10 mg total) by mouth daily. (Patient not taking: Reported on 09/25/2022) 90 tablet 3   lisinopril (ZESTRIL) 10 MG tablet Take 1 tablet (10 mg total) by mouth daily. 90 tablet 1   No current facility-administered medications for this visit.    Allergies-reviewed and updated Allergies  Allergen Reactions   Amlodipine Other (See Comments)    Headache with 5mg    Orange Fruit [Citrus]     migrains--headache   Latex Rash    Social History   Social History Narrative   Not on file    Objective:  BP 130/84 (BP Location: Left Arm, Patient Position: Sitting, Cuff Size: Large)   Pulse 82   Temp 98 F (36.7 C) (Temporal)   Ht 5\' 7"  (1.702 m)   Wt (!) 310 lb 8 oz (140.8 kg)   SpO2 97%   BMI 48.63 kg/m  Physical Exam Vitals and nursing note reviewed.  Constitutional:      Appearance: Normal appearance. She is obese.  HENT:     Head: Normocephalic.     Right Ear: Tympanic membrane normal.     Left Ear: Tympanic membrane normal.     Nose: Nose normal.     Mouth/Throat:     Mouth: Mucous membranes are moist.  Eyes:     Pupils: Pupils are equal, round, and reactive to light.  Cardiovascular:     Rate and Rhythm: Normal rate and regular rhythm.  Pulmonary:     Effort: Pulmonary effort is normal.     Breath sounds: Normal breath sounds.  Musculoskeletal:        General: Normal range of motion.     Cervical back: Normal range of motion.  Lymphadenopathy:     Cervical: No cervical adenopathy.  Skin:    General: Skin is warm and dry.  Neurological:     Mental Status: She is alert.  Psychiatric:        Mood and Affect: Mood normal.        Behavior: Behavior normal.      Assessment  and Plan   Health Maintenance counseling: 1. Anticipatory guidance: Patient counseled regarding regular dental exams q6 months, eye exams,  avoiding smoking and second hand smoke, limiting alcohol to 1 beverage per day, no illicit drugs.   2. Risk factor reduction:  Advised patient of need for regular exercise and diet rich with  fruits and vegetables to reduce risk of heart attack and stroke. Exercise- walking some.  Wt Readings from Last 3 Encounters:  09/25/22 (!) 310 lb 8 oz (140.8 kg)  05/29/22 (!) 305 lb (138.3 kg)  01/22/22 (!) 305 lb 2 oz (138.4 kg)   3. Immunizations/screenings/ancillary studies Immunization History  Administered Date(s) Administered   PFIZER(Purple Top)SARS-COV-2 Vaccination 03/24/2019, 04/19/2019, 01/26/2020   Health Maintenance Due  Topic Date Due   Colonoscopy  Never done    4. Cervical cancer screening-  08/2021 - positive HPV & LGSIL, followed by GYN 5. Breast cancer screening-  mammogram: 08/2021 6. Colon cancer screening - never  7. Skin cancer screening- advised regular sunscreen use. Denies worrisome, changing, or new skin lesions.  8. Birth control/STD check- none 9. Osteoporosis screening- N/A 10. Alcohol screening: none 11. Smoking associated screening (lung cancer screening, AAA screen 65-75, UA)- non- smoker  Problem List Items Addressed This Visit     Essential hypertension    Chronic, stable taking Lisinopril 10mg  daily refill sent checking CMP today f/u 6-63m or prn      Relevant Medications   lisinopril (ZESTRIL) 10 MG tablet   Hyperlipidemia due to type 2 diabetes mellitus (HCC)    Chronic pt seeing endocrinologist now last TC 205, LDL 136  in 05/2022  started on Lipitor 10mg , pt started taking but then stopped d/t hearing bad things about statins advised pt on goal of LDL <70 and w/diabetes she has higher risk of neg cardiac event pt will consider restarting as least weekly, & encouraged up to 3d/week will continue to follow       Relevant Medications   lisinopril (ZESTRIL) 10 MG tablet   History of hyperlipidemia, mixed   Rosacea    new red patches on both upper cheeks pt has not tried RX med sending Metrogel, advised on use & SE f/u prn      Relevant Medications   metroNIDAZOLE (METROGEL) 1 % gel   HSV-1 (herpes simplex virus 1) infection    chronic, never tx pt has had fever sores in past, but just used OTC meds recently a friend gave her Valcyclovir x 2 and it prevented blister from forming asking for RX sending Valtrex 2g bid x 1d at first sign of tingling f/u prn      Relevant Medications   valACYclovir (VALTREX) 1000 MG tablet   Other Visit Diagnoses     Annual physical exam    -  Primary   Relevant Orders   Comp Met (CMET)      Recommended follow up: No follow-ups on file. Future Appointments  Date Time Provider Department Center  12/07/2022  7:30 AM Shamleffer, Konrad Dolores, MD [facility] None  09/28/2023  8:00 AM Dulce Sellar, NP [facility]    Lab/Order associations: fasting   Dulce Sellar, NP

## 2022-09-24 NOTE — Patient Instructions (Addendum)
It was very nice to see you today!   I will review your lab results via MyChart in a few days. I have sent the Valacyclovir and Metrogel RX to your pharmacy.  Have a great weekend!     PLEASE NOTE:  If you had any lab tests please let us know if you have not heard back within a few days. You may see your results on MyChart before we have a chance to review them but we will give you a call once they are reviewed by [place]. If we ordered any referrals today, please let us know if you have not heard from their office within the next week.

## 2022-09-25 ENCOUNTER — Encounter: Payer: Self-pay | Admitting: Family

## 2022-09-25 ENCOUNTER — Ambulatory Visit (INDEPENDENT_AMBULATORY_CARE_PROVIDER_SITE_OTHER): Payer: BC Managed Care – PPO | Admitting: Family

## 2022-09-25 VITALS — BP 130/84 | HR 82 | Temp 98.0°F | Ht 67.0 in | Wt 310.5 lb

## 2022-09-25 DIAGNOSIS — L719 Rosacea, unspecified: Secondary | ICD-10-CM | POA: Diagnosis not present

## 2022-09-25 DIAGNOSIS — B009 Herpesviral infection, unspecified: Secondary | ICD-10-CM | POA: Diagnosis not present

## 2022-09-25 DIAGNOSIS — E786 Lipoprotein deficiency: Secondary | ICD-10-CM

## 2022-09-25 DIAGNOSIS — E785 Hyperlipidemia, unspecified: Secondary | ICD-10-CM | POA: Diagnosis not present

## 2022-09-25 DIAGNOSIS — Z8639 Personal history of other endocrine, nutritional and metabolic disease: Secondary | ICD-10-CM | POA: Insufficient documentation

## 2022-09-25 DIAGNOSIS — I10 Essential (primary) hypertension: Secondary | ICD-10-CM | POA: Diagnosis not present

## 2022-09-25 DIAGNOSIS — E1169 Type 2 diabetes mellitus with other specified complication: Secondary | ICD-10-CM

## 2022-09-25 DIAGNOSIS — Z0000 Encounter for general adult medical examination without abnormal findings: Secondary | ICD-10-CM

## 2022-09-25 LAB — COMPREHENSIVE METABOLIC PANEL
ALT: 101 U/L — ABNORMAL HIGH (ref 0–35)
AST: 81 U/L — ABNORMAL HIGH (ref 0–37)
Albumin: 4.3 g/dL (ref 3.5–5.2)
Alkaline Phosphatase: 48 U/L (ref 39–117)
BUN: 21 mg/dL (ref 6–23)
CO2: 23 mEq/L (ref 19–32)
Calcium: 9.7 mg/dL (ref 8.4–10.5)
Chloride: 101 mEq/L (ref 96–112)
Creatinine, Ser: 1.1 mg/dL (ref 0.40–1.20)
GFR: 58.44 mL/min — ABNORMAL LOW (ref >60.00)
Glucose, Bld: 137 mg/dL — ABNORMAL HIGH (ref 70–99)
Potassium: 4.5 mEq/L (ref 3.5–5.1)
Sodium: 135 mEq/L (ref 135–145)
Total Bilirubin: 0.4 mg/dL (ref 0.2–1.2)
Total Protein: 7.5 g/dL (ref 6.0–8.3)

## 2022-09-25 MED ORDER — VALACYCLOVIR HCL 1 G PO TABS: 2000.0000 mg | ORAL_TABLET | Freq: Two times a day (BID) | ORAL | 1 refills | Status: DC

## 2022-09-25 MED ORDER — METRONIDAZOLE 1 % EX GEL: Freq: Every day | CUTANEOUS | 0 refills | Status: AC

## 2022-09-25 MED ORDER — LISINOPRIL 10 MG PO TABS
10.0000 mg | ORAL_TABLET | Freq: Every day | ORAL | 1 refills | Status: DC
  Filled 2022-11-12: qty 30, 30d supply, fill #0
  Filled 2022-12-10: qty 30, 30d supply, fill #1
  Filled 2023-01-02: qty 30, 30d supply, fill #2

## 2022-09-25 NOTE — Assessment & Plan Note (Signed)
chronic, never tx pt has had fever sores in past, but just used OTC meds recently a friend gave her Valcyclovir x 2 and it prevented blister from forming asking for RX sending Valtrex 2g bid x 1d at first sign of tingling f/u prn

## 2022-09-25 NOTE — Assessment & Plan Note (Signed)
Chronic, stable taking Lisinopril 10mg  daily refill sent checking CMP today f/u 6-72m or prn

## 2022-09-25 NOTE — Assessment & Plan Note (Signed)
new red patches on both upper cheeks pt has not tried RX med sending Metrogel, advised on use & SE f/u prn

## 2022-09-25 NOTE — Assessment & Plan Note (Addendum)
Chronic pt seeing endocrinologist now last TC 205, LDL 136  in 05/2022  started on Lipitor 10mg , pt started taking but then stopped d/t hearing bad things about statins advised pt on goal of LDL <70 and w/diabetes she has higher risk of neg cardiac event pt will consider restarting as least weekly, & encouraged up to 3d/week will continue to follow

## 2022-09-28 ENCOUNTER — Encounter: Payer: Self-pay | Admitting: Family

## 2022-09-28 DIAGNOSIS — K760 Fatty (change of) liver, not elsewhere classified: Secondary | ICD-10-CM | POA: Insufficient documentation

## 2022-10-01 DIAGNOSIS — F331 Major depressive disorder, recurrent, moderate: Secondary | ICD-10-CM | POA: Diagnosis not present

## 2022-10-15 ENCOUNTER — Telehealth: Payer: BC Managed Care – PPO | Admitting: Physician Assistant

## 2022-10-15 DIAGNOSIS — F331 Major depressive disorder, recurrent, moderate: Secondary | ICD-10-CM | POA: Diagnosis not present

## 2022-10-15 DIAGNOSIS — R3989 Other symptoms and signs involving the genitourinary system: Secondary | ICD-10-CM | POA: Diagnosis not present

## 2022-10-15 MED ORDER — CEPHALEXIN 500 MG PO CAPS: 500.0000 mg | ORAL_CAPSULE | Freq: Two times a day (BID) | ORAL | 0 refills | Status: AC

## 2022-10-15 NOTE — Progress Notes (Signed)
I have spent 5 minutes in review of e-visit questionnaire, review and updating patient chart, medical decision making and response to patient.   Mia Milan Cody Jacklynn Dehaas, PA-C    

## 2022-10-15 NOTE — Progress Notes (Signed)

## 2022-10-16 ENCOUNTER — Other Ambulatory Visit: Payer: Self-pay | Admitting: Internal Medicine

## 2022-10-29 DIAGNOSIS — F331 Major depressive disorder, recurrent, moderate: Secondary | ICD-10-CM | POA: Diagnosis not present

## 2022-11-12 ENCOUNTER — Other Ambulatory Visit (HOSPITAL_COMMUNITY): Payer: Self-pay

## 2022-11-12 ENCOUNTER — Other Ambulatory Visit: Payer: Self-pay

## 2022-11-26 DIAGNOSIS — F331 Major depressive disorder, recurrent, moderate: Secondary | ICD-10-CM | POA: Diagnosis not present

## 2022-12-07 ENCOUNTER — Ambulatory Visit: Payer: BC Managed Care – PPO | Admitting: Internal Medicine

## 2022-12-07 ENCOUNTER — Encounter: Payer: Self-pay | Admitting: Internal Medicine

## 2022-12-07 VITALS — BP 124/76 | HR 55 | Ht 67.0 in | Wt 302.0 lb

## 2022-12-07 DIAGNOSIS — E039 Hypothyroidism, unspecified: Secondary | ICD-10-CM | POA: Diagnosis not present

## 2022-12-07 DIAGNOSIS — E785 Hyperlipidemia, unspecified: Secondary | ICD-10-CM | POA: Diagnosis not present

## 2022-12-07 DIAGNOSIS — Z794 Long term (current) use of insulin: Secondary | ICD-10-CM

## 2022-12-07 DIAGNOSIS — R7989 Other specified abnormal findings of blood chemistry: Secondary | ICD-10-CM

## 2022-12-07 DIAGNOSIS — E1122 Type 2 diabetes mellitus with diabetic chronic kidney disease: Secondary | ICD-10-CM

## 2022-12-07 DIAGNOSIS — E1165 Type 2 diabetes mellitus with hyperglycemia: Secondary | ICD-10-CM

## 2022-12-07 DIAGNOSIS — N1831 Chronic kidney disease, stage 3a: Secondary | ICD-10-CM

## 2022-12-07 LAB — POCT GLYCOSYLATED HEMOGLOBIN (HGB A1C): Hemoglobin A1C: 7.1 % — AB (ref 4.0–5.6)

## 2022-12-07 LAB — POCT GLUCOSE (DEVICE FOR HOME USE): Glucose Fasting, POC: 166 mg/dL — AB (ref 70–99)

## 2022-12-07 MED ORDER — RYBELSUS 14 MG PO TABS: 14.0000 mg | ORAL_TABLET | Freq: Every day | ORAL | 3 refills | Status: DC

## 2022-12-07 MED ORDER — METFORMIN HCL 850 MG PO TABS: 850.0000 mg | ORAL_TABLET | Freq: Two times a day (BID) | ORAL | 0 refills | Status: DC

## 2022-12-07 MED ORDER — EMPAGLIFLOZIN 10 MG PO TABS: 10.0000 mg | ORAL_TABLET | Freq: Every day | ORAL | 3 refills | Status: DC

## 2022-12-07 NOTE — Patient Instructions (Signed)
-   Start Jardiance 10  mg, 1 tablet  - Continue  Rybelsus 14 mg , 1 tablet Before Breakfast - Continue Metformin 850 mg , 1 tablet twice a day     HOW TO TREAT LOW BLOOD SUGARS (Blood sugar LESS THAN 70 MG/DL) Please follow the RULE OF 15 for the treatment of hypoglycemia treatment (when your (blood sugars are less than 70 mg/dL)   STEP 1: Take 15 grams of carbohydrates when your blood sugar is low, which includes:  3-4 GLUCOSE TABS  OR 3-4 OZ OF JUICE OR REGULAR SODA OR ONE TUBE OF GLUCOSE GEL    STEP 2: RECHECK blood sugar in 15 MINUTES STEP 3: If your blood sugar is still low at the 15 minute recheck --> then, go back to STEP 1 and treat AGAIN with another 15 grams of carbohydrates.

## 2022-12-07 NOTE — Progress Notes (Unsigned)
Name: Mary Moyer  MRN/ DOB: [id number], Apr 17, 1970   Age/ Sex: 52 y.o., female    PCP: Dulce Sellar, NP   Reason for Endocrinology Evaluation: Type 2 Diabetes Mellitus     Date of Initial Endocrinology Visit: 12/16/2020    PATIENT IDENTIFIER: Mary Moyer is a 52 y.o. female with a past medical history of T2DM, Hypothyroidism and HTN, S/P right nephrectomy due to renal cell carcinoma (03/2021) . the patient presented for initial endocrinology clinic visit on 12/16/2020 for consultative assistance with her diabetes management.    HPI: Mary Moyer was    Diagnosed with DM 2020 Prior Medications tried/Intolerance: Glipizide, Metformin             Hemoglobin A1c has ranged from 7.4% in 2020, peaking at 11.5% in 2021.   On her initial visit to our clinic she had an A1c of 8.8%, she needed clearance for right nephrectomy after optimization of glucose control.  We started Rybelsus, continue glipizide and metformin  She was also started on atorvastatin 10 mg with an LDL of 128 mg/DL  Stopped glipizide 0/2725 with an A1c of 6.1%,  Started glimepiride 01/2022 due to A1c 8.2%   THYROID HISTORY: She has been diagnosed with hypothyroidism in 2000. Denies prior neck sx or RAI ablation    Denies local neck symptoms Denies constipation   No FH of thyroid  Father with T2DM     SUBJECTIVE:   During the last visit (05/29/2022): A1c 7.2%    Today (12/07/22): Mary Moyer is here for a followup on diabetes management. She checks her  blood sugars 1 times daily , but has misplaced her meter recently.    She is S/P right renal mass resection 03/2021 with pathology report consistent with clear cell renal carcinoma, continues with nephrology follow-up   Denies nausea or vomiting  Has occasional loose stools  Denies local neck swelling  Denies palpitations  Denies tremors   Ran out of glimepiride , per pt the pharmacist told her its on recall and had to get it at a higher  cost  She self discontinued atorvastatin due to elevated Lft's   HOME DIABETES REGIMEN: Metformin 850 mg  BID Glimepiride 1 mg daily Rybelsus 14  mg daily Levothyroxine 175 mcg, daily  Atorvastatin 10 mg daily-not taking     Statin: yes  ACE-I/ARB: yes Prior Diabetic Education: no     METER DOWNLOAD SUMMARY: 148- 220   DIABETIC COMPLICATIONS: Microvascular complications:   Denies: CKD, retinopathy , neuropathy  Last eye exam: Completed 2021  Macrovascular complications:   Denies: CAD, PVD, CVA   PAST HISTORY: Past Medical History:  Past Medical History:  Diagnosis Date   Anemia    Anxiety    Arthritis    Cancer (HCC)    Chronic kidney disease    Depression    Diabetes (HCC)    Dyslipidemia 12/17/2020   Elevated LFTs 07/31/2019   Fatty liver    Headache    Hyperlipidemia 03/22/2018   Hypertension    Hypothyroidism    Hypothyroidism 03/22/2018   Migraines    Thyroid disease    UTI (urinary tract infection)    Past Surgical History:  Past Surgical History:  Procedure Laterality Date   ABLATION     ROBOT ASSISTED LAPAROSCOPIC NEPHRECTOMY Right 03/26/2021   Procedure: XI ROBOTIC ASSISTED LAPAROSCOPIC NEPHRECTOMY;  Surgeon: Rene Paci, MD;  Location: [facility];  Service: Urology;  Laterality: Right;   UTERINE FIBROID SURGERY  2003  Social History:  reports that she has never smoked. She has never used smokeless tobacco. She reports that she does not currently use alcohol. She reports that she does not use drugs. Family History:  Family History  Problem Relation Age of Onset   Kidney disease Mother    Arthritis Mother    Hypertension Mother    Miscarriages / [place] Mother    Depression Mother    Diabetes Father    Heart attack Father    Heart disease Father    Hyperlipidemia Father    Hypertension Father    Depression Sister    Heart disease Sister    Hypertension Sister    Asthma Daughter    Depression Daughter    Diabetes  Daughter    Hypertension Daughter    Miscarriages / [place] Daughter    Kidney disease Maternal Grandmother    Miscarriages / Stillbirths Maternal Grandmother    Hypertension Paternal Grandmother    Heart attack Paternal Grandmother    Cancer Paternal Grandfather    Diabetes Paternal Grandfather    Hypertension Paternal Grandfather    Colon cancer Neg Hx    Esophageal cancer Neg Hx    Pancreatic cancer Neg Hx    Stomach cancer Neg Hx    Breast cancer Neg Hx      HOME MEDICATIONS: Allergies as of 12/07/2022       Reactions   Amlodipine Other (See Comments)   Headache with 5mg    Orange Fruit [citrus]    migrains--headache   Latex Rash        Medication List        Accurate as of December 07, 2022 11:59 PM. If you have any questions, ask your nurse or doctor.          STOP taking these medications    glimepiride 1 MG tablet Commonly known as: AMARYL Stopped by: Johnney Ou Channa Hazelett       TAKE these medications    atorvastatin 10 MG tablet Commonly known as: LIPITOR Take 1 tablet (10 mg total) by mouth daily.   B-12 50 MCG Tabs Take 1 tablet by mouth daily.   blood glucose meter kit and supplies Kit Dispense based on patient and insurance preference. Use once daily before breakfast. E11.65   buPROPion 150 MG 12 hr tablet Commonly known as: WELLBUTRIN SR Take by mouth.   empagliflozin 10 MG Tabs tablet Commonly known as: Jardiance Take 1 tablet (10 mg total) by mouth daily before breakfast. Started by: Johnney Ou Sharae Zappulla   levothyroxine 175 MCG tablet Commonly known as: SYNTHROID Take 1 tablet (175 mcg total) by mouth daily.   lisinopril 10 MG tablet Commonly known as: ZESTRIL Take 1 tablet (10 mg total) by mouth daily.   metFORMIN 850 MG tablet Commonly known as: GLUCOPHAGE Take 1 tablet (850 mg total) by mouth 2 (two) times daily with a meal.   metroNIDAZOLE 1 % gel Commonly known as: Metrogel Apply topically daily. Apply to face  one time daily.   onetouch ultrasoft lancets Check blood sugar 2 times daily   OneTouch Verio test strip Generic drug: glucose blood 1 each by Other route in the morning and at bedtime. Use as instructed   Rybelsus 14 MG Tabs Generic drug: Semaglutide Take 1 tablet (14 mg total) by mouth daily.   valACYclovir 1000 MG tablet Commonly known as: VALTREX Take 2 tablets (2,000 mg total) by mouth 2 (two) times daily. Take for 1 day at first sign of tingling.  Vitamin D3 50 MCG (2000 UT) Chew Chew 1 tablet by mouth daily at 6 (six) AM.         ALLERGIES: Allergies  Allergen Reactions   Amlodipine Other (See Comments)    Headache with 5mg    Orange Fruit [Citrus]     migrains--headache   Latex Rash    OBJECTIVE:   VITAL SIGNS: BP 124/76 (BP Location: Left Arm, Patient Position: Sitting, Cuff Size: Large)   Pulse (!) 55   Ht 5\' 7"  (1.702 m)   Wt (!) 302 lb (137 kg)   SpO2 99%   BMI 47.30 kg/m    PHYSICAL EXAM:  General: Pt appears well and is in NAD  Neck: General: Supple without adenopathy or carotid bruits. Thyroid: Thyroid size normal.  No goiter or nodules appreciated.  Lungs: Clear with good BS bilat   Heart: RRR   Abdomen: soft, nontender  Extremities:  Lower extremities - No pretibial edema.   Neuro: MS is good with appropriate affect, pt is alert and Ox3   DM Foot Exam 05/29/2022 The skin of the feet is intact without sores or ulcerations. The pedal pulses are 2+ on right and 2+ on left. The sensation is intact to a screening 5.07, 10 gram monofilament bilaterally  DATA REVIEWED:  Lab Results  Component Value Date   HGBA1C 7.1 (A) 12/07/2022   HGBA1C 7.2 (A) 05/29/2022   HGBA1C 8.2 (A) 01/15/2022    Latest Reference Range & Units 12/07/22 08:19  COMPREHENSIVE METABOLIC PANEL  Rpt !  Sodium 135 - 146 mmol/L 138  Potassium 3.5 - 5.3 mmol/L 4.4  Chloride 98 - 110 mmol/L 104  CO2 20 - 32 mmol/L 23  Glucose 65 - 99 mg/dL 841 (H)  BUN 7 - 25 mg/dL  16  Creatinine 3.24 - 1.03 mg/dL 4.01  Calcium 8.6 - 02.7 mg/dL 9.7  BUN/Creatinine Ratio 6 - 22 (calc) SEE NOTE:  AG Ratio 1.0 - 2.5 (calc) 1.7  AST 10 - 35 U/L 45 (H)  ALT 6 - 29 U/L 71 (H)  Total Protein 6.1 - 8.1 g/dL 7.5  Total Bilirubin 0.2 - 1.2 mg/dL 0.3  Total CHOL/HDL Ratio <5.0 (calc) 4.3  Cholesterol <200 mg/dL 253  HDL Cholesterol > OR = 50 mg/dL 40 (L)  LDL Cholesterol (Calc) mg/dL (calc) 664 (H)  MICROALB/CREAT RATIO <30 mg/g creat 8  Non-HDL Cholesterol (Calc) <130 mg/dL (calc) 403 (H)  Triglycerides <150 mg/dL 474 (H)  Alkaline phosphatase (APISO) 37 - 153 U/L 44    Latest Reference Range & Units 12/07/22 08:19  TSH mIU/L 0.77    Latest Reference Range & Units 12/07/22 08:19  Microalb, Ur mg/dL 1.6  MICROALB/CREAT RATIO <30 mg/g creat 8  Creatinine, Urine 20 - 275 mg/dL 259     Latest Reference Range & Units 09/25/22 09:19  Sodium 135 - 145 mEq/L 135  Potassium 3.5 - 5.1 mEq/L 4.5  Chloride 96 - 112 mEq/L 101  CO2 19 - 32 mEq/L 23  Glucose 70 - 99 mg/dL 563 (H)  BUN 6 - 23 mg/dL 21  Creatinine 8.75 - 6.43 mg/dL 3.29  Calcium 8.4 - 51.8 mg/dL 9.7  Alkaline Phosphatase 39 - 117 U/L 48  Albumin 3.5 - 5.2 g/dL 4.3  AST 0 - 37 U/L 81 (H)  ALT 0 - 35 U/L 101 (H)  Total Protein 6.0 - 8.3 g/dL 7.5  Total Bilirubin 0.2 - 1.2 mg/dL 0.4  GFR >84.16 mL/min 58.44 (L)    Latest Reference  Range & Units 05/29/22 08:40  Creatinine,U mg/dL 161.0  Microalb, Ur 0.0 - 1.9 mg/dL 1.0  MICROALB/CREAT RATIO 0.0 - 30.0 mg/g 1.0   ASSESSMENT / PLAN / RECOMMENDATIONS:   1) Type 2 Diabetes Mellitus, Optimally  controlled, With CKD III complications - Most recent A1c of 7.1 %. Goal A1c < 7.0 %.    - A1c trending down  - She discontinued Glimepride, because it was on recall for [employer], and they gave her a different package glimepiride which cost her $45 -We discussed discussed SGLT-1 inhibitors, renal benefits as well as risk of genital infections .  Will start this,  encouraged hydration -I have also discussed switching Rybelsus to [facility] down the road  MEDICATIONS: Stop glimepiride 1 mg daily Start Jardiance 10 mg daily Continue Rybelsus 14 mg , 1 tablet Before Breakfast  Continue metformin 850 mg , 1 tablet twice a day before meals   EDUCATION / INSTRUCTIONS: BG monitoring instructions: Patient is instructed to check her blood sugars 1 times a day, fasting . Call [facility] if: BG persistently < 70  I reviewed the Rule of 15 for the treatment of hypoglycemia in detail with the patient. Literature supplied.   2) Diabetic complications:  Eye: Does not have known diabetic retinopathy.  Neuro/ Feet: Does not have known diabetic peripheral neuropathy. Renal: Patient does not have known baseline CKD. She is  on an ACEI/ARB at present.   3) Hypothyroidism:   -Patient is clinically euthyroid -TSH normal, no change   Medication  Levothyroxine 175 mcg daily    4) Dyslipidemia:    - She continues not to take statins due to skepticism regarding side effects -She also states she had elevated LFTs at PCPs office, and she attributes this elevation due to statin therapy -We again discussed cardiovascular benefits to statin therapy, and the importance of weighing the risk versus the benefit -Lipid panel continues to show above goal  5) Elevated LFTs:  -I suspect this is related to fatty liver rather than statin therapy -AST/ALT continue to be elevated but trending down    F/U in 4 months   Signed electronically by: Lyndle Herrlich, MD  [facility] Endocrinology  [facility] [address] [place] [place], [place] [address] Phone: [phone] FAX: [phone]   CC: Dulce Sellar, NP [address] [place] [address] Phone: [phone]  Fax: [phone]    Return to Endocrinology clinic as below: Future Appointments  Date Time Provider Department Center  04/07/2023  8:10 AM  Sariya Trickey, Konrad Dolores, MD [facility] None  09/28/2023  8:00 AM Dulce Sellar, NP [facility]

## 2022-12-08 DIAGNOSIS — E785 Hyperlipidemia, unspecified: Secondary | ICD-10-CM | POA: Insufficient documentation

## 2022-12-08 DIAGNOSIS — R7989 Other specified abnormal findings of blood chemistry: Secondary | ICD-10-CM | POA: Insufficient documentation

## 2022-12-08 DIAGNOSIS — N1831 Chronic kidney disease, stage 3a: Secondary | ICD-10-CM | POA: Insufficient documentation

## 2022-12-08 LAB — MICROALBUMIN / CREATININE URINE RATIO
Creatinine, Urine: 199 mg/dL (ref 20–275)
Microalb Creat Ratio: 8 mg/g{creat} (ref <30)
Microalb, Ur: 1.6 mg/dL

## 2022-12-08 LAB — COMPREHENSIVE METABOLIC PANEL
AG Ratio: 1.7 (calc) (ref 1.0–2.5)
ALT: 71 U/L — ABNORMAL HIGH (ref 6–29)
AST: 45 U/L — ABNORMAL HIGH (ref 10–35)
Albumin: 4.7 g/dL (ref 3.6–5.1)
Alkaline phosphatase (APISO): 44 U/L (ref 37–153)
BUN: 16 mg/dL (ref 7–25)
CO2: 23 mmol/L (ref 20–32)
Calcium: 9.7 mg/dL (ref 8.6–10.4)
Chloride: 104 mmol/L (ref 98–110)
Creat: 1.02 mg/dL (ref 0.50–1.03)
Globulin: 2.8 g/dL (ref 1.9–3.7)
Glucose, Bld: 130 mg/dL — ABNORMAL HIGH (ref 65–99)
Potassium: 4.4 mmol/L (ref 3.5–5.3)
Sodium: 138 mmol/L (ref 135–146)
Total Bilirubin: 0.3 mg/dL (ref 0.2–1.2)
Total Protein: 7.5 g/dL (ref 6.1–8.1)

## 2022-12-08 LAB — LIPID PANEL
Cholesterol: 172 mg/dL (ref <200)
HDL: 40 mg/dL — ABNORMAL LOW (ref >=50)
LDL Cholesterol (Calc): 105 mg/dL — ABNORMAL HIGH
Non-HDL Cholesterol (Calc): 132 mg/dL — ABNORMAL HIGH (ref <130)
Total CHOL/HDL Ratio: 4.3 (calc) (ref <5.0)
Triglycerides: 156 mg/dL — ABNORMAL HIGH (ref <150)

## 2022-12-08 LAB — TSH: TSH: 0.77 m[IU]/L

## 2022-12-08 MED ORDER — LEVOTHYROXINE SODIUM 175 MCG PO TABS: 175.0000 ug | ORAL_TABLET | Freq: Every day | ORAL | 3 refills | Status: DC

## 2022-12-11 ENCOUNTER — Other Ambulatory Visit (HOSPITAL_COMMUNITY): Payer: Self-pay

## 2022-12-24 DIAGNOSIS — F331 Major depressive disorder, recurrent, moderate: Secondary | ICD-10-CM | POA: Diagnosis not present

## 2023-01-04 ENCOUNTER — Other Ambulatory Visit (HOSPITAL_COMMUNITY): Payer: Self-pay

## 2023-01-05 ENCOUNTER — Other Ambulatory Visit (HOSPITAL_COMMUNITY): Payer: Self-pay

## 2023-02-15 ENCOUNTER — Encounter: Payer: Self-pay | Admitting: Internal Medicine

## 2023-02-15 ENCOUNTER — Other Ambulatory Visit (HOSPITAL_COMMUNITY): Payer: Self-pay

## 2023-02-18 ENCOUNTER — Telehealth: Payer: Self-pay | Admitting: Pharmacy Technician

## 2023-02-18 ENCOUNTER — Other Ambulatory Visit (HOSPITAL_COMMUNITY): Payer: Self-pay

## 2023-02-18 NOTE — Telephone Encounter (Signed)
PA request has been Started. New Encounter created for follow up. For additional info see Pharmacy Prior Auth telephone encounter from 02/18/23.

## 2023-02-18 NOTE — Telephone Encounter (Signed)
Pharmacy Patient Advocate Encounter   Received notification from Patient Advice Request messages that prior authorization for Rybelsus 14mg  is required/requested.   Insurance verification completed.   The patient is insured through [employer] .   Per test claim: PA required; PA started via CoverMyMeds. KEY [id number] . Please see clinical question(s) below that I am not finding the answer to in her chart and advise.   Is the patient unable to use injectable products due to physical limitations, visual impairments, needle-phobia or other clinical reason?

## 2023-02-19 MED ORDER — TIRZEPATIDE 5 MG/0.5ML ~~LOC~~ SOAJ: 5.0000 mg | SUBCUTANEOUS | 3 refills | Status: DC

## 2023-02-19 NOTE — Telephone Encounter (Signed)
Patient is okay with doing injectable

## 2023-02-22 ENCOUNTER — Other Ambulatory Visit (HOSPITAL_COMMUNITY): Payer: Self-pay

## 2023-02-22 ENCOUNTER — Telehealth: Payer: Self-pay | Admitting: Pharmacy Technician

## 2023-02-22 NOTE — Telephone Encounter (Signed)
PA request has been  received . New Encounter created for follow up. For additional info see Pharmacy Prior Auth telephone encounter from 02/22/23.

## 2023-02-22 NOTE — Telephone Encounter (Signed)
Pharmacy Patient Advocate Encounter   Received notification from Patient Advice Request messages that prior authorization for [facility] (previously for Rybelsus) is required/requested.   Insurance verification completed.   The patient is insured through [employer] .   Per test claim: PA required; PA submitted to above mentioned insurance via CoverMyMeds Key/confirmation #/EOC [id number] Status is pending (sent via fax through [facility].)  PA Case ID #: [id number]  It says Outcome NA -  A claim submitted today for a quantity of 6 of MOUNJARO 5 MG/0.5ML PEN INJCTR would deny for exceeding the maximum cost allowed by the plan.

## 2023-02-23 NOTE — Telephone Encounter (Signed)
Additional information has been requested from the patient's insurance in order to proceed with the prior authorization request. Requested information has been sent, or form has been filled out and faxed back to [facility] @ [phone]

## 2023-02-25 ENCOUNTER — Other Ambulatory Visit (HOSPITAL_COMMUNITY): Payer: Self-pay

## 2023-02-25 NOTE — Telephone Encounter (Signed)
Does this mean the Mounjaro is not covered either

## 2023-02-25 NOTE — Telephone Encounter (Signed)
No Rybelsus

## 2023-02-25 NOTE — Telephone Encounter (Signed)
Additional information has been requested from the patient's insurance in order to proceed with the prior authorization request. Requested information has been sent, or form has been filled out and faxed back to [phone] [employer].

## 2023-02-25 NOTE — Telephone Encounter (Signed)
Pharmacy Patient Advocate Encounter  Received notification from [facility] [employer] that Prior Authorization for Rybelsus 14mg  has been APPROVED from 02/22/23 to 02/22/24. Ran test claim, Copay is $14.99 for 1 month, after 15.01 being paid by an evoucher. This test claim was processed through [facility]- copay amounts may vary at other pharmacies due to pharmacy/plan contracts, or as the patient moves through the different stages of their insurance plan.   PA #/Case ID/Reference #: [id number]

## 2023-03-02 NOTE — Telephone Encounter (Signed)
 Pharmacy Patient Advocate Encounter  Received notification from [facility] that Prior Authorization for Mary Moyer has been APPROVED through 02/22/24

## 2023-03-04 ENCOUNTER — Other Ambulatory Visit: Payer: Self-pay

## 2023-03-04 MED ORDER — LEVOTHYROXINE SODIUM 175 MCG PO TABS: 175.0000 ug | ORAL_TABLET | Freq: Every day | ORAL | 3 refills | Status: DC

## 2023-03-09 ENCOUNTER — Other Ambulatory Visit: Payer: Self-pay | Admitting: Urology

## 2023-03-09 DIAGNOSIS — C641 Malignant neoplasm of right kidney, except renal pelvis: Secondary | ICD-10-CM

## 2023-03-09 DIAGNOSIS — R911 Solitary pulmonary nodule: Secondary | ICD-10-CM

## 2023-03-11 ENCOUNTER — Encounter: Payer: Self-pay | Admitting: Family

## 2023-03-11 ENCOUNTER — Ambulatory Visit (INDEPENDENT_AMBULATORY_CARE_PROVIDER_SITE_OTHER): Admitting: Family

## 2023-03-11 VITALS — BP 130/88 | HR 67 | Temp 98.0°F | Ht 67.0 in | Wt 303.0 lb

## 2023-03-11 DIAGNOSIS — Z7985 Long-term (current) use of injectable non-insulin antidiabetic drugs: Secondary | ICD-10-CM

## 2023-03-11 DIAGNOSIS — E1165 Type 2 diabetes mellitus with hyperglycemia: Secondary | ICD-10-CM | POA: Diagnosis not present

## 2023-03-11 DIAGNOSIS — L988 Other specified disorders of the skin and subcutaneous tissue: Secondary | ICD-10-CM | POA: Diagnosis not present

## 2023-03-11 DIAGNOSIS — I10 Essential (primary) hypertension: Secondary | ICD-10-CM

## 2023-03-11 MED ORDER — LISINOPRIL 10 MG PO TABS: 10.0000 mg | ORAL_TABLET | Freq: Every day | ORAL | 2 refills | Status: DC

## 2023-03-11 NOTE — Progress Notes (Signed)
 Patient ID: Mary Moyer, female    DOB: [birth year], 53 y.o.   MRN: [id number]  Chief Complaint  Patient presents with  . Insect Bite    Pt states that a spot came on about month ago draining yellow discharge. Right breast area. It was really hard to start with and has became another knot after draining.It is very painful feels like a burn.       Discussed the use of AI scribe software for clinical note transcription with the patient, who gave verbal consent to proceed.  History of Present Illness   The patient presents with a month-long history of a skin lesion, initially thought to be a spider bite. The lesion was initially ignored due to stress related to moving. After the move, the lesion became hot to touch and developed surrounding erythema. The lesion then began to bleed, which provided some relief from the associated pain. A week later, the lesion developed a deep hole and began to ooze pus. The patient attempted to express the pus in the bathtub, where the water provided some relief from the tenderness. The lesion continues to ooze occasionally & she is keeping covered with a bandaid. The patient also reports a hard area around the lesion.  In addition to the skin lesion, the patient also reports needing a refill of her lisinopril prescription for HTN. She also mentions a recent change in her diabetes medication from Rybelsus to [facility] due to insurance coverage issues. The patient has not yet started the [facility].         Assessment & Plan:     Skin lesion - Likely spider bite with a month-long history. Initially presented with erythema and warmth, then bleeding, followed by purulent discharge. Currently, no active discharge, erythema has resolved, but there is a small amount of residual induration and discoloration. -Clean with soap and water daily. -Apply a thin layer of generic triple antibiotic ointment for a few days until no longer oozing. -Keep covered with bandage until no  longer oozing. -Contact the office if there is recurrence of redness or pus.  Type 2 Diabetes - Transitioning from Rybelsus to [facility] due to insurance coverage. Patient has not yet started [facility]. Demonstrated use with sample pen, advised package should also come with picture instructions. -Advised patient on potential side effects, nausea, heartburn, or constipation, advised on preventive measures, but may need additional med support from her ENDO.  Hypertension - Stable on Lisinopril 10mg  qd. BP good today, denies any SE. -Continue Lisinopril, sending refill. -F/U in 88m-1 yr      Subjective:    Outpatient Medications Prior to Visit  Medication Sig Dispense Refill  . blood glucose meter kit and supplies KIT Dispense based on patient and insurance preference. Use once daily before breakfast. E11.65 1 each 0  . Cholecalciferol (VITAMIN D3) 50 MCG (2000 UT) CHEW Chew 1 tablet by mouth daily at 6 (six) AM.    . Cyanocobalamin (B-12) 50 MCG TABS Take 1 tablet by mouth daily.    . empagliflozin (JARDIANCE) 10 MG TABS tablet Take 1 tablet (10 mg total) by mouth daily before breakfast. 90 tablet 3  . glucose blood (ONETOUCH VERIO) test strip 1 each by Other route in the morning and at bedtime. Use as instructed 200 each 3  . Lancets (ONETOUCH ULTRASOFT) lancets Check blood sugar 2 times daily 100 each 12  . levothyroxine (SYNTHROID) 175 MCG tablet Take 1 tablet (175 mcg total) by mouth daily. 90 tablet 3  .  lisinopril (ZESTRIL) 10 MG tablet Take 1 tablet (10 mg total) by mouth daily. 90 tablet 1  . metFORMIN (GLUCOPHAGE) 850 MG tablet Take 1 tablet (850 mg total) by mouth 2 (two) times daily with a meal. 180 tablet 0  . metroNIDAZOLE (METROGEL) 1 % gel Apply topically daily. Apply to face one time daily. 45 g 0  . tirzepatide (MOUNJARO) 5 MG/0.5ML Pen Inject 5 mg into the skin once a week. To replace rybelsus 6 mL 3  . valACYclovir (VALTREX) 1000 MG tablet Take 2 tablets (2,000 mg total) by  mouth 2 (two) times daily. Take for 1 day at first sign of tingling. 20 tablet 1  . atorvastatin (LIPITOR) 10 MG tablet Take 1 tablet (10 mg total) by mouth daily. (Patient not taking: Reported on 03/11/2023) 90 tablet 3  . buPROPion (WELLBUTRIN SR) 150 MG 12 hr tablet Take by mouth. (Patient not taking: Reported on 03/11/2023)     No facility-administered medications prior to visit.   Past Medical History:  Diagnosis Date  . Anemia   . Anxiety   . Arthritis   . Cancer (HCC)   . Chronic kidney disease   . Depression   . Diabetes (HCC)   . Dyslipidemia 12/17/2020  . Elevated LFTs 07/31/2019  . Fatty liver   . Headache   . Hyperlipidemia 03/22/2018  . Hypertension   . Hypothyroidism   . Hypothyroidism 03/22/2018  . Migraines   . Thyroid disease   . UTI (urinary tract infection)    Past Surgical History:  Procedure Laterality Date  . ABLATION    . ROBOT ASSISTED LAPAROSCOPIC NEPHRECTOMY Right 03/26/2021   Procedure: XI ROBOTIC ASSISTED LAPAROSCOPIC NEPHRECTOMY;  Surgeon: Rene Paci, MD;  Location: [facility];  Service: Urology;  Laterality: Right;  . UTERINE FIBROID SURGERY  2003   Allergies  Allergen Reactions  . Amlodipine Other (See Comments)    Headache with 5mg   . Orange Fruit [Citrus]     migrains--headache  . Latex Rash      Objective:    Physical Exam Vitals and nursing note reviewed.  Constitutional:      Appearance: Normal appearance.  Cardiovascular:     Rate and Rhythm: Normal rate and regular rhythm.  Pulmonary:     Effort: Pulmonary effort is normal.     Breath sounds: Normal breath sounds.  Musculoskeletal:        General: Normal range of motion.  Skin:    General: Skin is warm and dry.     Findings: Lesion (right lateral breast, approx. 2cm in diameter of darkish purple discoloration, 0.3cm area of induration surrounding lesion, no erythema or tenderness) present.  Neurological:     Mental Status: She is alert.  Psychiatric:        Mood  and Affect: Mood normal.        Behavior: Behavior normal.   BP 130/88   Pulse 67   Temp 98 F (36.7 C) (Temporal)   Ht 5\' 7"  (1.702 m)   Wt (!) 303 lb (137.4 kg)   SpO2 98%   BMI 47.46 kg/m  Wt Readings from Last 3 Encounters:  03/11/23 (!) 303 lb (137.4 kg)  12/07/22 (!) 302 lb (137 kg)  09/25/22 (!) 310 lb 8 oz (140.8 kg)       Dulce Sellar, NP

## 2023-03-11 NOTE — Assessment & Plan Note (Signed)
 Transitioning from Rybelsus to [facility] due to insurance coverage. Patient has not yet started [facility]. Demonstrated use with sample pen, advised package should also come with picture instructions. -Advised patient on potential side effects, nausea, heartburn, or constipation, advised on preventive measures, but may need additional med support from her ENDO.

## 2023-03-11 NOTE — Assessment & Plan Note (Signed)
 Stable on Lisinopril 10mg  qd. BP good today, denies any SE. -Continue Lisinopril, sending refill. -F/U in 49m-1 yr

## 2023-04-06 NOTE — Progress Notes (Unsigned)
 Name: Mary Moyer  MRN/ DOB: [id number], October 14, 1970   Age/ Sex: 53 y.o., female    PCP: Dulce Sellar, NP   Reason for Endocrinology Evaluation: Type 2 Diabetes Mellitus     Date of Initial Endocrinology Visit: 12/16/2020    PATIENT IDENTIFIER: Mary Moyer is a 53 y.o. female with a past medical history of T2DM, Hypothyroidism and HTN, S/P right nephrectomy due to renal cell carcinoma (03/2021) . the patient presented for initial endocrinology clinic visit on 12/16/2020 for consultative assistance with her diabetes management.    HPI: Mary Moyer was    Diagnosed with DM 2020 Prior Medications tried/Intolerance: Glipizide, Metformin             Hemoglobin A1c has ranged from 7.4% in 2020, peaking at 11.5% in 2021.   On her initial visit to our clinic she had an A1c of 8.8%, she needed clearance for right nephrectomy after optimization of glucose control.  We started Rybelsus, continue glipizide and metformin  She was also started on atorvastatin 10 mg with an LDL of 128 mg/DL  Stopped glipizide 04/979 with an A1c of 6.1%,  Started glimepiride 01/2022 due to A1c 8.2%  Started jardiance and stopped Glimepiride 12/2022 with an A1c 7.1% . She had stopped Glimepiride due to a recall   THYROID HISTORY: She has been diagnosed with hypothyroidism in 2000. Denies prior neck sx or RAI ablation    Denies local neck symptoms Denies constipation   No FH of thyroid  Father with T2DM     SUBJECTIVE:   During the last visit (12/07/2022): A1c 7.1%    Today (12/07/22): Mary Moyer is here for a followup on diabetes management. She checks her  blood sugars 1 times daily , but has misplaced her meter recently.    She is S/P right renal mass resection 03/2021 with pathology report consistent with clear cell renal carcinoma, continues with nephrology follow-up   Denies nausea or vomiting  Has occasional loose stools  Denies local neck swelling  Denies palpitations  Denies  tremors   Ran out of glimepiride , per pt the pharmacist told her its on recall and had to get it at a higher cost  She self discontinued atorvastatin due to elevated Lft's   HOME DIABETES REGIMEN: Metformin 850 mg  BID Jardiance 10 mg daily  Rybelsus 14  mg daily Levothyroxine 175 mcg, daily      Statin: yes  ACE-I/ARB: yes Prior Diabetic Education: no     METER DOWNLOAD SUMMARY: 148- 220   DIABETIC COMPLICATIONS: Microvascular complications:   Denies: CKD, retinopathy , neuropathy  Last eye exam: Completed 2021  Macrovascular complications:   Denies: CAD, PVD, CVA   PAST HISTORY: Past Medical History:  Past Medical History:  Diagnosis Date   Anemia    Anxiety    Arthritis    Cancer (HCC)    Chronic kidney disease    Depression    Diabetes (HCC)    Dyslipidemia 12/17/2020   Elevated LFTs 07/31/2019   Fatty liver    Headache    Hyperlipidemia 03/22/2018   Hypertension    Hypothyroidism    Hypothyroidism 03/22/2018   Migraines    Thyroid disease    UTI (urinary tract infection)    Past Surgical History:  Past Surgical History:  Procedure Laterality Date   ABLATION     ROBOT ASSISTED LAPAROSCOPIC NEPHRECTOMY Right 03/26/2021   Procedure: XI ROBOTIC ASSISTED LAPAROSCOPIC NEPHRECTOMY;  Surgeon: Rene Paci, MD;  Location: [facility];  Service: Urology;  Laterality: Right;   UTERINE FIBROID SURGERY  2003    Social History:  reports that she has never smoked. She has never used smokeless tobacco. She reports that she does not currently use alcohol. She reports that she does not use drugs. Family History:  Family History  Problem Relation Age of Onset   Kidney disease Mother    Arthritis Mother    Hypertension Mother    Miscarriages / [place] Mother    Depression Mother    Diabetes Father    Heart attack Father    Heart disease Father    Hyperlipidemia Father    Hypertension Father    Depression Sister    Heart disease Sister     Hypertension Sister    Asthma Daughter    Depression Daughter    Diabetes Daughter    Hypertension Daughter    Miscarriages / [place] Daughter    Kidney disease Maternal Grandmother    Miscarriages / Stillbirths Maternal Grandmother    Hypertension Paternal Grandmother    Heart attack Paternal Grandmother    Cancer Paternal Grandfather    Diabetes Paternal Grandfather    Hypertension Paternal Grandfather    Colon cancer Neg Hx    Esophageal cancer Neg Hx    Pancreatic cancer Neg Hx    Stomach cancer Neg Hx    Breast cancer Neg Hx      HOME MEDICATIONS: Allergies as of 04/07/2023       Reactions   Amlodipine Other (See Comments)   Headache with 5mg    Orange Fruit [citrus]    migrains--headache   Latex Rash        Medication List        Accurate as of April 06, 2023  1:56 PM. If you have any questions, ask your nurse or doctor.          atorvastatin 10 MG tablet Commonly known as: LIPITOR Take 1 tablet (10 mg total) by mouth daily.   B-12 50 MCG Tabs Take 1 tablet by mouth daily.   blood glucose meter kit and supplies Kit Dispense based on patient and insurance preference. Use once daily before breakfast. E11.65   empagliflozin 10 MG Tabs tablet Commonly known as: Jardiance Take 1 tablet (10 mg total) by mouth daily before breakfast.   levothyroxine 175 MCG tablet Commonly known as: SYNTHROID Take 1 tablet (175 mcg total) by mouth daily.   lisinopril 10 MG tablet Commonly known as: ZESTRIL Take 1 tablet (10 mg total) by mouth daily.   metFORMIN 850 MG tablet Commonly known as: GLUCOPHAGE Take 1 tablet (850 mg total) by mouth 2 (two) times daily with a meal.   metroNIDAZOLE 1 % gel Commonly known as: Metrogel Apply topically daily. Apply to face one time daily.   onetouch ultrasoft lancets Check blood sugar 2 times daily   OneTouch Verio test strip Generic drug: glucose blood 1 each by Other route in the morning and at bedtime. Use as  instructed   tirzepatide 5 MG/0.5ML Pen Commonly known as: MOUNJARO Inject 5 mg into the skin once a week. To replace rybelsus   valACYclovir 1000 MG tablet Commonly known as: VALTREX Take 2 tablets (2,000 mg total) by mouth 2 (two) times daily. Take for 1 day at first sign of tingling.   Vitamin D3 50 MCG (2000 UT) Chew Chew 1 tablet by mouth daily at 6 (six) AM.         ALLERGIES: Allergies  Allergen  Reactions   Amlodipine Other (See Comments)    Headache with 5mg    Orange Fruit [Citrus]     migrains--headache   Latex Rash    OBJECTIVE:   VITAL SIGNS: There were no vitals taken for this visit.   PHYSICAL EXAM:  General: Pt appears well and is in NAD  Neck: General: Supple without adenopathy or carotid bruits. Thyroid: Thyroid size normal.  No goiter or nodules appreciated.  Lungs: Clear with good BS bilat   Heart: RRR   Abdomen: soft, nontender  Extremities:  Lower extremities - No pretibial edema.   Neuro: MS is good with appropriate affect, pt is alert and Ox3   DM Foot Exam 05/29/2022 The skin of the feet is intact without sores or ulcerations. The pedal pulses are 2+ on right and 2+ on left. The sensation is intact to a screening 5.07, 10 gram monofilament bilaterally  DATA REVIEWED:  Lab Results  Component Value Date   HGBA1C 7.1 (A) 12/07/2022   HGBA1C 7.2 (A) 05/29/2022   HGBA1C 8.2 (A) 01/15/2022    Latest Reference Range & Units 12/07/22 08:19  COMPREHENSIVE METABOLIC PANEL  Rpt !  Sodium 135 - 146 mmol/L 138  Potassium 3.5 - 5.3 mmol/L 4.4  Chloride 98 - 110 mmol/L 104  CO2 20 - 32 mmol/L 23  Glucose 65 - 99 mg/dL 578 (H)  BUN 7 - 25 mg/dL 16  Creatinine 4.69 - 6.29 mg/dL 5.28  Calcium 8.6 - 41.3 mg/dL 9.7  BUN/Creatinine Ratio 6 - 22 (calc) SEE NOTE:  AG Ratio 1.0 - 2.5 (calc) 1.7  AST 10 - 35 U/L 45 (H)  ALT 6 - 29 U/L 71 (H)  Total Protein 6.1 - 8.1 g/dL 7.5  Total Bilirubin 0.2 - 1.2 mg/dL 0.3  Total CHOL/HDL Ratio <5.0 (calc) 4.3   Cholesterol <200 mg/dL 244  HDL Cholesterol > OR = 50 mg/dL 40 (L)  LDL Cholesterol (Calc) mg/dL (calc) 010 (H)  MICROALB/CREAT RATIO <30 mg/g creat 8  Non-HDL Cholesterol (Calc) <130 mg/dL (calc) 272 (H)  Triglycerides <150 mg/dL 536 (H)  Alkaline phosphatase (APISO) 37 - 153 U/L 44    Latest Reference Range & Units 12/07/22 08:19  TSH mIU/L 0.77    Latest Reference Range & Units 12/07/22 08:19  Microalb, Ur mg/dL 1.6  MICROALB/CREAT RATIO <30 mg/g creat 8  Creatinine, Urine 20 - 275 mg/dL 644     Latest Reference Range & Units 09/25/22 09:19  Sodium 135 - 145 mEq/L 135  Potassium 3.5 - 5.1 mEq/L 4.5  Chloride 96 - 112 mEq/L 101  CO2 19 - 32 mEq/L 23  Glucose 70 - 99 mg/dL 034 (H)  BUN 6 - 23 mg/dL 21  Creatinine 7.42 - 5.95 mg/dL 6.38  Calcium 8.4 - 75.6 mg/dL 9.7  Alkaline Phosphatase 39 - 117 U/L 48  Albumin 3.5 - 5.2 g/dL 4.3  AST 0 - 37 U/L 81 (H)  ALT 0 - 35 U/L 101 (H)  Total Protein 6.0 - 8.3 g/dL 7.5  Total Bilirubin 0.2 - 1.2 mg/dL 0.4  GFR >43.32 mL/min 58.44 (L)    Latest Reference Range & Units 05/29/22 08:40  Creatinine,U mg/dL 951.8  Microalb, Ur 0.0 - 1.9 mg/dL 1.0  MICROALB/CREAT RATIO 0.0 - 30.0 mg/g 1.0   ASSESSMENT / PLAN / RECOMMENDATIONS:   1) Type 2 Diabetes Mellitus, Optimally  controlled, With CKD III complications - Most recent A1c of 7.1 %. Goal A1c < 7.0 %.    - A1c  trending down  - She discontinued Glimepride, because it was on recall for [employer], and they gave her a different package glimepiride which cost her $45 -We discussed discussed SGLT-1 inhibitors, renal benefits as well as risk of genital infections .  Will start this, encouraged hydration -I have also discussed switching Rybelsus to [facility] down the road  MEDICATIONS: Stop glimepiride 1 mg daily Start Jardiance 10 mg daily Continue Rybelsus 14 mg , 1 tablet Before Breakfast  Continue metformin 850 mg , 1 tablet twice a day before meals   EDUCATION / INSTRUCTIONS: BG  monitoring instructions: Patient is instructed to check her blood sugars 1 times a day, fasting . Call [place] [facility] if: BG persistently < 70  I reviewed the Rule of 15 for the treatment of hypoglycemia in detail with the patient. Literature supplied.   2) Diabetic complications:  Eye: Does not have known diabetic retinopathy.  Neuro/ Feet: Does not have known diabetic peripheral neuropathy. Renal: Patient does not have known baseline CKD. She is  on an ACEI/ARB at present.   3) Hypothyroidism:   -Patient is clinically euthyroid -TSH normal, no change   Medication  Levothyroxine 175 mcg daily    4) Dyslipidemia:    - She continues not to take statins due to skepticism regarding side effects -She also states she had elevated LFTs at PCPs office, and she attributes this elevation due to statin therapy -We again discussed cardiovascular benefits to statin therapy, and the importance of weighing the risk versus the benefit -Lipid panel continues to show above goal  5) Elevated LFTs:  -I suspect this is related to fatty liver rather than statin therapy -AST/ALT continue to be elevated but trending down    F/U in 4 months   Signed electronically by: Lyndle Herrlich, MD  [facility]  [facility] [address] [place], [place] [address] Phone: [phone] FAX: [phone]   CC: Dulce Sellar, NP [address] [place] [address] Phone: [phone]  Fax: [phone]    Return to Endocrinology clinic as below: Future Appointments  Date Time Provider Department Center  04/07/2023  8:10 AM Treonna Klee, Konrad Dolores, MD [facility] None  09/28/2023  8:00 AM Dulce Sellar, NP [facility]

## 2023-04-07 ENCOUNTER — Encounter: Payer: Self-pay | Admitting: Internal Medicine

## 2023-04-07 ENCOUNTER — Ambulatory Visit: Payer: BC Managed Care – PPO | Admitting: Internal Medicine

## 2023-04-07 VITALS — BP 124/76 | HR 74 | Ht 67.0 in | Wt 292.0 lb

## 2023-04-07 DIAGNOSIS — Z7984 Long term (current) use of oral hypoglycemic drugs: Secondary | ICD-10-CM | POA: Diagnosis not present

## 2023-04-07 DIAGNOSIS — Z7985 Long-term (current) use of injectable non-insulin antidiabetic drugs: Secondary | ICD-10-CM | POA: Diagnosis not present

## 2023-04-07 DIAGNOSIS — E1122 Type 2 diabetes mellitus with diabetic chronic kidney disease: Secondary | ICD-10-CM

## 2023-04-07 DIAGNOSIS — N1831 Chronic kidney disease, stage 3a: Secondary | ICD-10-CM

## 2023-04-07 DIAGNOSIS — E039 Hypothyroidism, unspecified: Secondary | ICD-10-CM

## 2023-04-07 LAB — POCT GLUCOSE (DEVICE FOR HOME USE): Glucose Fasting, POC: 152 mg/dL — AB (ref 70–99)

## 2023-04-07 LAB — POCT GLYCOSYLATED HEMOGLOBIN (HGB A1C): Hemoglobin A1C: 6.6 % — AB (ref 4.0–5.6)

## 2023-04-07 MED ORDER — LEVOTHYROXINE SODIUM 175 MCG PO TABS: 175.0000 ug | ORAL_TABLET | Freq: Every day | ORAL | 3 refills | Status: AC

## 2023-04-07 MED ORDER — TIRZEPATIDE 7.5 MG/0.5ML ~~LOC~~ SOAJ: 7.5000 mg | SUBCUTANEOUS | 3 refills | Status: DC

## 2023-04-07 MED ORDER — EMPAGLIFLOZIN 25 MG PO TABS: 25.0000 mg | ORAL_TABLET | Freq: Every day | ORAL | 3 refills | Status: DC

## 2023-04-07 NOTE — Patient Instructions (Signed)
-   Increase Jardiance 25  mg, 1 tablet  - Continue Metformin 850 mg , 1 tablet twice a day - Increase Mounjaro 7.5 mg weekly      HOW TO TREAT LOW BLOOD SUGARS (Blood sugar LESS THAN 70 MG/DL) Please follow the RULE OF 15 for the treatment of hypoglycemia treatment (when your (blood sugars are less than 70 mg/dL)   STEP 1: Take 15 grams of carbohydrates when your blood sugar is low, which includes:  3-4 GLUCOSE TABS  OR 3-4 OZ OF JUICE OR REGULAR SODA OR ONE TUBE OF GLUCOSE GEL    STEP 2: RECHECK blood sugar in 15 MINUTES STEP 3: If your blood sugar is still low at the 15 minute recheck --> then, go back to STEP 1 and treat AGAIN with another 15 grams of carbohydrates.

## 2023-04-28 ENCOUNTER — Other Ambulatory Visit: Payer: Self-pay | Admitting: Internal Medicine

## 2023-06-08 ENCOUNTER — Other Ambulatory Visit: Payer: Self-pay | Admitting: Internal Medicine

## 2023-09-14 ENCOUNTER — Ambulatory Visit
Admission: EM | Admit: 2023-09-14 | Discharge: 2023-09-14 | Disposition: A | Discharge status: Home / Self Care | Attending: Family Medicine | Admitting: Family Medicine

## 2023-09-14 DIAGNOSIS — G44209 Tension-type headache, unspecified, not intractable: Secondary | ICD-10-CM

## 2023-09-14 DIAGNOSIS — Z905 Acquired absence of kidney: Secondary | ICD-10-CM

## 2023-09-14 DIAGNOSIS — M542 Cervicalgia: Secondary | ICD-10-CM | POA: Diagnosis not present

## 2023-09-14 DIAGNOSIS — B349 Viral infection, unspecified: Secondary | ICD-10-CM

## 2023-09-14 LAB — POC SOFIA SARS ANTIGEN FIA: SARS Coronavirus 2 Ag: NEGATIVE

## 2023-09-14 LAB — POCT RAPID STREP A (OFFICE): Rapid Strep A Screen: NEGATIVE

## 2023-09-14 MED ORDER — CETIRIZINE HCL 10 MG PO TABS: 10.0000 mg | ORAL_TABLET | Freq: Every day | ORAL | 0 refills | Status: DC

## 2023-09-14 MED ORDER — PSEUDOEPHEDRINE HCL 30 MG PO TABS: 30.0000 mg | ORAL_TABLET | Freq: Three times a day (TID) | ORAL | 0 refills | Status: DC | PRN

## 2023-09-14 MED ORDER — KETOROLAC TROMETHAMINE 30 MG/ML IJ SOLN: 30.0000 mg | Freq: Once | INTRAMUSCULAR | Status: DC

## 2023-09-14 MED ORDER — KETOROLAC TROMETHAMINE 30 MG/ML IJ SOLN
30.0000 mg | Freq: Once | INTRAMUSCULAR | Status: AC
  Administered 2023-09-14: 30 mg via INTRAMUSCULAR

## 2023-09-14 NOTE — Discharge Instructions (Addendum)
 We will manage this as a viral syndrome.  For sore throat or cough try using a honey-based tea. Use 3 teaspoons of honey with juice squeezed from half lemon. Place shaved pieces of ginger into 1/2-1 cup of water  and warm over stove top. Then mix the ingredients and repeat every 4 hours as needed. Please take Tylenol  500mg -650mg  once every 8 hours for fevers, aches and pains. Hydrate very well with at least 2 liters (64 ounces) of water . Eat light meals such as soups (chicken and noodles, chicken wild rice, vegetable).  Do not eat any foods that you are allergic to.  Start an antihistamine like Zyrtec  (10mg  daily) for postnasal drainage, sinus congestion.  You can take this together with pseudoephedrine  (Sudafed) at a dose of 30 mg 3 times a day or twice daily as needed for the same kind of congestion.    If your symptoms persist or worsen then I recommend presenting to the emergency room for more testing than we can perform in the urgent care setting.

## 2023-09-14 NOTE — ED Triage Notes (Signed)
 Pt reports neck pain, fatigue, headache x 2 days. Pt has not taken any meds for complaint.

## 2023-09-14 NOTE — ED Provider Notes (Signed)
 [facility]  Note:  This document was prepared using Conservation officer, historic buildings and may include unintentional dictation errors.  MRN: [id number] DOB: 06-21-1970  Subjective:   Mary Moyer is a 53 y.o. female presenting for 2 day history of sinus headaches, chills, neck pain, malaise and fatigue, sinus fullness. No fever, cough, chest pain, shob, wheezing. No asthma. No smoking of any kind including cigarettes, cigars, vaping, marijuana use.  Would like a strep test and COVID test.  Has a history of nephrectomy, acquired solitary kidney.  Tries to avoid NSAIDs.  Also has history of elevated liver enzymes, tries to avoid use of Tylenol .  No current facility-administered medications for this encounter.  Current Outpatient Medications:    atorvastatin  (LIPITOR) 10 MG tablet, Take 1 tablet (10 mg total) by mouth daily. (Patient not taking: Reported on 04/07/2023), Disp: 90 tablet, Rfl: 3   blood glucose meter kit and supplies KIT, Dispense based on patient and insurance preference. Use once daily before breakfast. E11.65, Disp: 1 each, Rfl: 0   Cholecalciferol (VITAMIN D3) 50 MCG (2000 UT) CHEW, Chew 1 tablet by mouth daily at 6 (six) AM., Disp: , Rfl:    Cyanocobalamin (B-12) 50 MCG TABS, Take 1 tablet by mouth daily., Disp: , Rfl:    empagliflozin  (JARDIANCE ) 25 MG TABS tablet, Take 1 tablet (25 mg total) by mouth daily before breakfast., Disp: 90 tablet, Rfl: 3   glucose blood (ONETOUCH VERIO) test strip, 1 each by Other route in the morning and at bedtime. Use as instructed, Disp: 200 each, Rfl: 3   Lancets (ONETOUCH DELICA PLUS LANCET33G) MISC, USE 1 LANCET FOR FINGERSTICK TO CHECK GLUCOSE TWICE DAILY, Disp: 100 each, Rfl: 5   levothyroxine  (SYNTHROID ) 175 MCG tablet, Take 1 tablet (175 mcg total) by mouth daily., Disp: 90 tablet, Rfl: 3   lisinopril  (ZESTRIL ) 10 MG tablet, Take 1 tablet (10 mg total) by mouth daily., Disp: 90 tablet, Rfl: 2   metFORMIN   (GLUCOPHAGE ) 850 MG tablet, Take 1 tablet by mouth twice daily with food, Disp: 180 tablet, Rfl: 1   metroNIDAZOLE  (METROGEL ) 1 % gel, Apply topically daily. Apply to face one time daily., Disp: 45 g, Rfl: 0   tirzepatide  (MOUNJARO ) 7.5 MG/0.5ML Pen, Inject 7.5 mg into the skin once a week., Disp: 6 mL, Rfl: 3   valACYclovir  (VALTREX ) 1000 MG tablet, Take 2 tablets (2,000 mg total) by mouth 2 (two) times daily. Take for 1 day at first sign of tingling., Disp: 20 tablet, Rfl: 1   Allergies  Allergen Reactions   Amlodipine  Other (See Comments)    Headache with 5mg    Orange Fruit [Citrus]     migrains--headache   Latex Rash    Past Medical History:  Diagnosis Date   Anemia    Anovulation 12/23/1999   Anxiety    Arthritis    Cancer (HCC)    Chronic kidney disease    Depression    Diabetes (HCC)    Dyslipidemia 12/17/2020   Elevated LFTs 07/31/2019   Fatty liver    Headache    Hyperlipidemia 03/22/2018   Hypertension    Hypothyroidism    Hypothyroidism 03/22/2018   Migraines    Polycystic ovaries 06/12/1999   Thyroid  disease    UTI (urinary tract infection)      Past Surgical History:  Procedure Laterality Date   ABLATION     ROBOT ASSISTED LAPAROSCOPIC NEPHRECTOMY Right 03/26/2021   Procedure: XI ROBOTIC ASSISTED LAPAROSCOPIC NEPHRECTOMY;  Surgeon:  Devere Lonni Righter, MD;  Location: [facility];  Service: Urology;  Laterality: Right;   UTERINE FIBROID SURGERY  2003    Family History  Problem Relation Age of Onset   Kidney disease Mother    Arthritis Mother    Hypertension Mother    Miscarriages / [place] Mother    Depression Mother    Diabetes Father    Heart attack Father    Heart disease Father    Hyperlipidemia Father    Hypertension Father    Depression Sister    Heart disease Sister    Hypertension Sister    Asthma Daughter    Depression Daughter    Diabetes Daughter    Hypertension Daughter    Miscarriages / [place] Daughter    Kidney  disease Maternal Grandmother    Miscarriages / Stillbirths Maternal Grandmother    Hypertension Paternal Grandmother    Heart attack Paternal Grandmother    Cancer Paternal Grandfather    Diabetes Paternal Grandfather    Hypertension Paternal Grandfather    Colon cancer Neg Hx    Esophageal cancer Neg Hx    Pancreatic cancer Neg Hx    Stomach cancer Neg Hx    Breast cancer Neg Hx     Social History   Tobacco Use   Smoking status: Never   Smokeless tobacco: Never  Vaping Use   Vaping status: Never Used  Substance Use Topics   Alcohol use: Not Currently   Drug use: Never    ROS   Objective:   Vitals: BP 108/70 (BP Location: Right Arm)   Pulse 79   Temp 98.5 F (36.9 C) (Oral)   Resp 18   SpO2 97%   Physical Exam Constitutional:      General: She is not in acute distress.    Appearance: Normal appearance. She is well-developed and normal weight. She is not ill-appearing, toxic-appearing or diaphoretic.  HENT:     Head: Normocephalic and atraumatic.     Right Ear: Tympanic membrane, ear canal and external ear normal. No drainage or tenderness. No middle ear effusion. There is no impacted cerumen. Tympanic membrane is not erythematous or bulging.     Left Ear: Tympanic membrane, ear canal and external ear normal. No drainage or tenderness.  No middle ear effusion. There is no impacted cerumen. Tympanic membrane is not erythematous or bulging.     Nose: Nose normal. No congestion or rhinorrhea.     Mouth/Throat:     Mouth: Mucous membranes are moist. No oral lesions.     Pharynx: No pharyngeal swelling, oropharyngeal exudate, posterior oropharyngeal erythema or uvula swelling.     Tonsils: No tonsillar exudate or tonsillar abscesses.  Eyes:     General: No scleral icterus.       Right eye: No discharge.        Left eye: No discharge.     Extraocular Movements: Extraocular movements intact.     Right eye: Normal extraocular motion.     Left eye: Normal extraocular  motion.     Conjunctiva/sclera: Conjunctivae normal.  Neck:     Meningeal: Brudzinski's sign and Kernig's sign absent.  Cardiovascular:     Rate and Rhythm: Normal rate and regular rhythm.     Heart sounds: Normal heart sounds. No murmur heard.    No friction rub. No gallop.  Pulmonary:     Effort: Pulmonary effort is normal. No respiratory distress.     Breath sounds: No stridor. No wheezing, rhonchi or  rales.  Chest:     Chest wall: No tenderness.  Musculoskeletal:     Cervical back: Normal range of motion and neck supple. No rigidity. Muscular tenderness present.  Lymphadenopathy:     Cervical: No cervical adenopathy.  Skin:    General: Skin is warm and dry.  Neurological:     General: No focal deficit present.     Mental Status: She is alert and oriented to person, place, and time.     Cranial Nerves: No cranial nerve deficit.     Motor: No weakness.     Coordination: Coordination normal.     Gait: Gait normal.  Psychiatric:        Mood and Affect: Mood normal.        Behavior: Behavior normal.     Results for orders placed or performed during the hospital encounter of 09/14/23 (from the past 24 hours)  POC SARS Coronavirus 2 Ag     Status: None   Collection Time: 09/14/23  1:39 PM  Result Value Ref Range   SARS Coronavirus 2 Ag Negative Negative   IM Toradol  30mg  administered in clinic.   Assessment and Plan :   PDMP not reviewed this encounter.  1. Acute viral syndrome   2. Neck pain   3. Tension headache   4. Solitary kidney, acquired    Discussed the possibility of an acute encephalopathy, meningitis but patient has normal neurologic exam and would prefer to avoid the emergency room now.  CrCl calculated at 73mL/min-136mL/min. Suspect viral URI, viral syndrome. Physical exam findings reassuring and vital signs stable for discharge. Advised supportive care, offered symptomatic relief. Counseled patient on potential for adverse effects with medications  prescribed/recommended today, ER and return-to-clinic precautions discussed, patient verbalized understanding.     Christopher Savannah, [place] 09/14/23 [phone]

## 2023-09-28 ENCOUNTER — Encounter: Payer: BC Managed Care – PPO | Admitting: Family

## 2023-10-01 ENCOUNTER — Encounter: Payer: Self-pay | Admitting: Family

## 2023-10-01 ENCOUNTER — Ambulatory Visit (INDEPENDENT_AMBULATORY_CARE_PROVIDER_SITE_OTHER): Admitting: Family

## 2023-10-01 VITALS — BP 109/73 | HR 47 | Temp 97.7°F | Ht 67.0 in | Wt 284.4 lb

## 2023-10-01 DIAGNOSIS — B009 Herpesviral infection, unspecified: Secondary | ICD-10-CM

## 2023-10-01 DIAGNOSIS — Z0000 Encounter for general adult medical examination without abnormal findings: Secondary | ICD-10-CM

## 2023-10-01 DIAGNOSIS — E1169 Type 2 diabetes mellitus with other specified complication: Secondary | ICD-10-CM | POA: Diagnosis not present

## 2023-10-01 DIAGNOSIS — E785 Hyperlipidemia, unspecified: Secondary | ICD-10-CM | POA: Diagnosis not present

## 2023-10-01 DIAGNOSIS — Z789 Other specified health status: Secondary | ICD-10-CM | POA: Insufficient documentation

## 2023-10-01 DIAGNOSIS — Z23 Encounter for immunization: Secondary | ICD-10-CM | POA: Diagnosis not present

## 2023-10-01 DIAGNOSIS — I10 Essential (primary) hypertension: Secondary | ICD-10-CM

## 2023-10-01 DIAGNOSIS — I498 Other specified cardiac arrhythmias: Secondary | ICD-10-CM

## 2023-10-01 DIAGNOSIS — Z1283 Encounter for screening for malignant neoplasm of skin: Secondary | ICD-10-CM | POA: Diagnosis not present

## 2023-10-01 DIAGNOSIS — N921 Excessive and frequent menstruation with irregular cycle: Secondary | ICD-10-CM

## 2023-10-01 DIAGNOSIS — Z8742 Personal history of other diseases of the female genital tract: Secondary | ICD-10-CM

## 2023-10-01 DIAGNOSIS — I499 Cardiac arrhythmia, unspecified: Secondary | ICD-10-CM | POA: Diagnosis not present

## 2023-10-01 DIAGNOSIS — R87612 Low grade squamous intraepithelial lesion on cytologic smear of cervix (LGSIL): Secondary | ICD-10-CM

## 2023-10-01 LAB — CBC WITH DIFFERENTIAL/PLATELET
Basophils Absolute: 0 K/uL (ref 0.0–0.1)
Basophils Relative: 0.5 % (ref 0.0–3.0)
Eosinophils Absolute: 0.1 K/uL (ref 0.0–0.7)
Eosinophils Relative: 1.3 % (ref 0.0–5.0)
HCT: 39.5 % (ref 36.0–46.0)
Hemoglobin: 12.7 g/dL (ref 12.0–15.0)
Lymphocytes Relative: 24.5 % (ref 12.0–46.0)
Lymphs Abs: 1.7 K/uL (ref 0.7–4.0)
MCHC: 32.3 g/dL (ref 30.0–36.0)
MCV: 86.2 fl (ref 78.0–100.0)
Monocytes Absolute: 0.5 K/uL (ref 0.1–1.0)
Monocytes Relative: 7.8 % (ref 3.0–12.0)
Neutro Abs: 4.5 K/uL (ref 1.4–7.7)
Neutrophils Relative %: 65.9 % (ref 43.0–77.0)
Platelets: 300 K/uL (ref 150.0–400.0)
RBC: 4.58 Mil/uL (ref 3.87–5.11)
RDW: 14.5 % (ref 11.5–15.5)
WBC: 6.8 K/uL (ref 4.0–10.5)

## 2023-10-01 LAB — LIPID PANEL
Cholesterol: 188 mg/dL (ref 0–200)
HDL: 35.8 mg/dL — ABNORMAL LOW (ref >39.00)
LDL Cholesterol: 121 mg/dL — ABNORMAL HIGH (ref 0–99)
NonHDL: 152.12
Total CHOL/HDL Ratio: 5
Triglycerides: 158 mg/dL — ABNORMAL HIGH (ref 0.0–149.0)
VLDL: 31.6 mg/dL (ref 0.0–40.0)

## 2023-10-01 LAB — COMPREHENSIVE METABOLIC PANEL WITH GFR
ALT: 25 U/L (ref 0–35)
AST: 21 U/L (ref 0–37)
Albumin: 4.4 g/dL (ref 3.5–5.2)
Alkaline Phosphatase: 51 U/L (ref 39–117)
BUN: 24 mg/dL — ABNORMAL HIGH (ref 6–23)
CO2: 25 meq/L (ref 19–32)
Calcium: 9.7 mg/dL (ref 8.4–10.5)
Chloride: 104 meq/L (ref 96–112)
Creatinine, Ser: 1.01 mg/dL (ref 0.40–1.20)
GFR: 64.29 mL/min (ref >60.00)
Glucose, Bld: 102 mg/dL — ABNORMAL HIGH (ref 70–99)
Potassium: 4.5 meq/L (ref 3.5–5.1)
Sodium: 136 meq/L (ref 135–145)
Total Bilirubin: 0.3 mg/dL (ref 0.2–1.2)
Total Protein: 7.6 g/dL (ref 6.0–8.3)

## 2023-10-01 MED ORDER — VALACYCLOVIR HCL 1 G PO TABS: 2000.0000 mg | ORAL_TABLET | Freq: Two times a day (BID) | ORAL | 1 refills | Status: AC

## 2023-10-01 MED ORDER — LISINOPRIL 10 MG PO TABS
10.0000 mg | ORAL_TABLET | Freq: Every day | ORAL | 3 refills | Status: AC
Start: 2023-10-01 — End: ?

## 2023-10-01 NOTE — Assessment & Plan Note (Signed)
 Chronic pt seeing endocrinologist now last TC 172, LDL 105  in 12/2022  started on Lipitor 10mg , stopped d/t hearing bad things about statins and not feeling well while taking even a few days per week. advised pt on goal of LDL <70 and w/diabetes she has higher risk of neg cardiac event will continue to follow

## 2023-10-01 NOTE — Progress Notes (Signed)
 Phone [phone]  Subjective:   Patient is a 53 y.o. female presenting for annual physical.    Chief Complaint  Patient presents with   Annual Exam    Fasting w/ labs   Hypertension    Medication refill of Lisinopril    HSV-1 (herpes simplex virus 1) infection    Medication refill of valtrex   HPI: Heavy menses - pt reports going 12mos and then she had a cycle in Feb, then none again for a few months until recently and reports having a lot of bleeding with large clots and then brownish discharge. Denies worse pain than usual. Reports hx of endometriosis and fibroids. Last PAP done in 2023 with positive HPV and LSIL, did not follow up with GYN.  HSV-1 - pt takes Valtrex  prn for cold sores. Requesting refill today.  Patient is currently maintained on the following medications for blood pressure: Lisinopril  10mg  qd Patient reports good compliance with blood pressure medications. Patient denies chest pain, headaches, shortness of breath or swelling. Last 3 blood pressure readings in our office are as follows: BP Readings from Last 3 Encounters:  10/01/23 109/73  09/14/23 108/70  04/07/23 124/76    See problem oriented charting- ROS- full  review of systems was completed and negative other than what is in HPI above.  The following were reviewed and entered/updated in epic: Past Medical History:  Diagnosis Date   Anemia    Anovulation 12/23/1999   Anxiety    Arthritis    Cancer (HCC)    Chronic kidney disease    Depression    Diabetes (HCC)    Dyslipidemia 12/17/2020   Elevated LFTs 07/31/2019   Fatty liver    Headache    Hyperlipidemia 03/22/2018   Hypertension    Hypothyroidism    Hypothyroidism 03/22/2018   Migraines    Polycystic ovaries 06/12/1999   Thyroid  disease    UTI (urinary tract infection)    Patient Active Problem List   Diagnosis Date Noted   Statin intolerance 10/01/2023   Menorrhagia with irregular cycle 10/01/2023   Elevated LFTs 12/08/2022    Type 2 diabetes mellitus with stage 3a chronic kidney disease, without long-term current use of insulin  (HCC) 12/08/2022   Hepatic steatosis 09/28/2022   Rosacea 09/25/2022   HSV-1 (herpes simplex virus 1) infection 09/25/2022   Renal mass 03/26/2021   Hyperlipidemia due to type 2 diabetes mellitus (HCC) 02/24/2021   Type 2 diabetes mellitus with hyperglycemia, without long-term current use of insulin  (HCC) 02/24/2021   Insomnia due to medical condition 02/24/2021   Vitamin D  deficiency 12/17/2020   Acquired hypothyroidism 12/16/2020   Renal mass, right 06/27/2020   Hx of endometriosis 06/03/2018   Iron deficiency anemia due to chronic blood loss 05/06/2018   Depression, major, single episode, severe (HCC) 03/30/2018   Diuretic-induced hypokalemia 03/30/2018   GAD (generalized anxiety disorder) 03/30/2018   Essential hypertension 03/22/2018   Morbid obesity (HCC) 03/22/2018   History of PCOS 03/22/2018   Past Surgical History:  Procedure Laterality Date   ABLATION     ROBOT ASSISTED LAPAROSCOPIC NEPHRECTOMY Right 03/26/2021   Procedure: XI ROBOTIC ASSISTED LAPAROSCOPIC NEPHRECTOMY;  Surgeon: Devere Lonni Righter, MD;  Location: [facility];  Service: Urology;  Laterality: Right;   UTERINE FIBROID SURGERY  2003    Family History  Problem Relation Age of Onset   Kidney disease Mother    Arthritis Mother    Hypertension Mother    Miscarriages / [place] Mother    Depression Mother  Diabetes Father    Heart attack Father    Heart disease Father    Hyperlipidemia Father    Hypertension Father    Depression Sister    Heart disease Sister    Hypertension Sister    Asthma Daughter    Depression Daughter    Diabetes Daughter    Hypertension Daughter    Miscarriages / [place] Daughter    Kidney disease Maternal Grandmother    Miscarriages / Stillbirths Maternal Grandmother    Hypertension Paternal Grandmother    Heart attack Paternal Grandmother    Cancer Paternal  Grandfather    Diabetes Paternal Grandfather    Hypertension Paternal Grandfather    Colon cancer Neg Hx    Esophageal cancer Neg Hx    Pancreatic cancer Neg Hx    Stomach cancer Neg Hx    Breast cancer Neg Hx     Medications- reviewed and updated Current Outpatient Medications  Medication Sig Dispense Refill   Cholecalciferol (VITAMIN D3) 50 MCG (2000 UT) CHEW Chew 1 tablet by mouth daily at 6 (six) AM.     Cyanocobalamin (B-12) 50 MCG TABS Take 1 tablet by mouth daily.     empagliflozin  (JARDIANCE ) 25 MG TABS tablet Take 1 tablet (25 mg total) by mouth daily before breakfast. 90 tablet 3   glucose blood (ONETOUCH VERIO) test strip 1 each by Other route in the morning and at bedtime. Use as instructed 200 each 3   Lancets (ONETOUCH DELICA PLUS LANCET33G) MISC USE 1 LANCET FOR FINGERSTICK TO CHECK GLUCOSE TWICE DAILY 100 each 5   levothyroxine  (SYNTHROID ) 175 MCG tablet Take 1 tablet (175 mcg total) by mouth daily. 90 tablet 3   metFORMIN  (GLUCOPHAGE ) 850 MG tablet Take 1 tablet by mouth twice daily with food 180 tablet 1   metroNIDAZOLE  (METROGEL ) 1 % gel Apply topically daily. Apply to face one time daily. 45 g 0   tirzepatide  (MOUNJARO ) 7.5 MG/0.5ML Pen Inject 7.5 mg into the skin once a week. 6 mL 3   blood glucose meter kit and supplies KIT Dispense based on patient and insurance preference. Use once daily before breakfast. E11.65 1 each 0   cetirizine  (ZYRTEC  ALLERGY) 10 MG tablet Take 1 tablet (10 mg total) by mouth daily. 30 tablet 0   lisinopril  (ZESTRIL ) 10 MG tablet Take 1 tablet (10 mg total) by mouth daily. 90 tablet 3   valACYclovir  (VALTREX ) 1000 MG tablet Take 2 tablets (2,000 mg total) by mouth 2 (two) times daily. Take for 1 day at first sign of tingling. 20 tablet 1   No current facility-administered medications for this visit.    Allergies-reviewed and updated Allergies  Allergen Reactions   Amlodipine  Other (See Comments)    Headache with 5mg    Orange Fruit  [Citrus]     migrains--headache   Latex Rash    Social History   Social History Narrative   Not on file    Objective:  BP 109/73 (BP Location: Left Arm, Patient Position: Sitting, Cuff Size: Large)   Pulse (!) 47   Temp 97.7 F (36.5 C) (Temporal)   Ht 5' 7 (1.702 m)   Wt 284 lb 6 oz (129 kg)   SpO2 100%   BMI 44.54 kg/m  Physical Exam Vitals and nursing note reviewed.  Constitutional:      Appearance: Normal appearance. She is morbidly obese.  HENT:     Head: Normocephalic.     Right Ear: Tympanic membrane normal.  Left Ear: Tympanic membrane normal.     Nose: Nose normal.     Mouth/Throat:     Mouth: Mucous membranes are moist.  Eyes:     Pupils: Pupils are equal, round, and reactive to light.  Cardiovascular:     Rate and Rhythm: Normal rate and regular rhythm. Extrasystoles are present.    Heart sounds:     Gallop present. S3 sounds present.  Pulmonary:     Effort: Pulmonary effort is normal.     Breath sounds: Normal breath sounds.  Musculoskeletal:        General: Normal range of motion.     Cervical back: Normal range of motion.  Lymphadenopathy:     Cervical: No cervical adenopathy.  Skin:    General: Skin is warm and dry.  Neurological:     Mental Status: She is alert.  Psychiatric:        Mood and Affect: Mood normal.        Behavior: Behavior normal.     Assessment and Plan   Health Maintenance counseling: 1. Anticipatory guidance: Patient counseled regarding regular dental exams q6 months, eye exams,  avoiding smoking and second hand smoke, limiting alcohol to 1 beverage per day, no illicit drugs.   2. Risk factor reduction:  Advised patient of need for regular exercise and diet rich with fruits and vegetables to reduce risk of heart attack and stroke. Exercise- walking some.  Wt Readings from Last 3 Encounters:  10/01/23 284 lb 6 oz (129 kg)  04/07/23 292 lb (132.5 kg)  03/11/23 (!) 303 lb (137.4 kg)   3.  Immunizations/screenings/ancillary studies Immunization History  Administered Date(s) Administered   PFIZER(Purple Top)SARS-COV-2 Vaccination 03/24/2019, 04/19/2019, 01/26/2020   Td 01/03/2002   Tdap 10/01/2023   Zoster Recombinant(Shingrix ) 08/14/2023, 10/01/2023   There are no preventive care reminders to display for this patient.   4. Cervical cancer screening-  08/2021 - positive HPV & LGSIL, followed by GYN 5. Breast cancer screening-  mammogram: last 08/2021, due this year 6. Colon cancer screening - cologuard completed 7. Skin cancer screening- advised regular sunscreen use. Denies worrisome, changing, or new skin lesions.  8. Birth control/STD check- none, no STD check wanted 9. Osteoporosis screening- N/A 10. Alcohol screening: none 11. Smoking associated screening (lung cancer screening, AAA screen 65-75, UA)- non- smoker.  Annual physical exam -     Comprehensive metabolic panel with GFR -     CBC with Differential/Platelet  HSV-1 (herpes simplex virus 1) infection Assessment & Plan: chronic refilling Valtrex  2g, bid. Take at first sign of tingling for 1 day f/u prn  Orders: -     valACYclovir  HCl; Take 2 tablets (2,000 mg total) by mouth 2 (two) times daily. Take for 1 day at first sign of tingling.  Dispense: 20 tablet; Refill: 1  Essential hypertension Assessment & Plan: Stable on Lisinopril  10mg  qd. BP good today, denies any SE. -Continue Lisinopril , sending refill. -F/U in 46m-1 yr   Orders: -     Lisinopril ; Take 1 tablet (10 mg total) by mouth daily.  Dispense: 90 tablet; Refill: 3  Need for shingles vaccine -     Varicella-zoster vaccine IM  Immunization due -     Tdap vaccine greater than or equal to 7yo IM  Hyperlipidemia due to type 2 diabetes mellitus [facility]) Assessment & Plan: Chronic pt seeing endocrinologist now last TC 172, LDL 105  in 12/2022  started on Lipitor 10mg , stopped d/t hearing bad things about  statins and not feeling well while  taking even a few days per week. advised pt on goal of LDL <70 and w/diabetes she has higher risk of neg cardiac event will continue to follow  Orders: -     Lipid panel  Statin intolerance  Screening for skin cancer -     Ambulatory referral to Dermatology  Irregular cardiac rhythm -     EKG 12-Lead  Menorrhagia with irregular cycle Hx of endometriosis and fibroids. Last PAP in 2023 with + HPV and LSIL, advised to see GYN, referral sent pt never seen. Reminded again to follow up asap. Sending new referral.  Ventricular trigeminy Regular extra beats auscultated, EKG w/trigeminy, pt feels tired, sending referral for further eval.  -     Ambulatory referral to Cardiology   Recommended follow up: Return in about 6 months (around 03/30/2024) for med refills, Complete physical w/fasting labs. Future Appointments  Date Time Provider Department Center  10/07/2023  7:50 AM Shamleffer, Donell Cardinal, MD [id number] None   Lab/Order associations: fasting  Lucius Krabbe, NP

## 2023-10-01 NOTE — Patient Instructions (Addendum)
 It was very nice to see you today!   You look great! Great job so far with your weight loss! Stay well and keep exercising :-)  Call to get your mammogram done!  I will review your lab results via MyChart in a few days. We will your insurance form on Monday and confirm with you.  I have also sent in your refills. Please schedule a follow up visit in 6 months unless seen by Dr Sam, then ok to wait until your physical next year.      PLEASE NOTE:  If you had any lab tests please let us  know if you have not heard back within a few days. You may see your results on MyChart before we have a chance to review them but we will give you a call once they are reviewed by us . If we ordered any referrals today, please let us  know if you have not heard from their office within the next week.

## 2023-10-01 NOTE — Assessment & Plan Note (Signed)
 Stable on Lisinopril 10mg  qd. BP good today, denies any SE. -Continue Lisinopril, sending refill. -F/U in 49m-1 yr

## 2023-10-01 NOTE — Assessment & Plan Note (Signed)
 chronic refilling Valtrex  2g, bid. Take at first sign of tingling for 1 day f/u prn

## 2023-10-05 ENCOUNTER — Encounter: Payer: Self-pay | Admitting: Family

## 2023-10-05 ENCOUNTER — Ambulatory Visit: Payer: Self-pay | Admitting: Family

## 2023-10-05 DIAGNOSIS — R87622 Low grade squamous intraepithelial lesion on cytologic smear of vagina (LGSIL): Secondary | ICD-10-CM | POA: Insufficient documentation

## 2023-10-05 DIAGNOSIS — T466X5A Adverse effect of antihyperlipidemic and antiarteriosclerotic drugs, initial encounter: Secondary | ICD-10-CM | POA: Insufficient documentation

## 2023-10-05 NOTE — Progress Notes (Signed)
 per lab message: call after 2 days to be sure she understands to follow up with GYN please & ask if willing to try a different statin 2-3d/week?

## 2023-10-07 ENCOUNTER — Encounter: Payer: Self-pay | Admitting: Internal Medicine

## 2023-10-07 ENCOUNTER — Ambulatory Visit: Admitting: Internal Medicine

## 2023-10-07 VITALS — BP 110/70 | HR 71 | Ht 67.0 in | Wt 282.4 lb

## 2023-10-07 DIAGNOSIS — Z7984 Long term (current) use of oral hypoglycemic drugs: Secondary | ICD-10-CM

## 2023-10-07 DIAGNOSIS — E1122 Type 2 diabetes mellitus with diabetic chronic kidney disease: Secondary | ICD-10-CM

## 2023-10-07 DIAGNOSIS — N1831 Chronic kidney disease, stage 3a: Secondary | ICD-10-CM | POA: Diagnosis not present

## 2023-10-07 DIAGNOSIS — E039 Hypothyroidism, unspecified: Secondary | ICD-10-CM

## 2023-10-07 DIAGNOSIS — E785 Hyperlipidemia, unspecified: Secondary | ICD-10-CM

## 2023-10-07 DIAGNOSIS — Z7985 Long-term (current) use of injectable non-insulin antidiabetic drugs: Secondary | ICD-10-CM

## 2023-10-07 LAB — POCT GLYCOSYLATED HEMOGLOBIN (HGB A1C): Hemoglobin A1C: 5.8 % — AB (ref 4.0–5.6)

## 2023-10-07 LAB — POCT GLUCOSE (DEVICE FOR HOME USE): Glucose Fasting, POC: 126 mg/dL — AB (ref 70–99)

## 2023-10-07 MED ORDER — METFORMIN HCL 850 MG PO TABS: 850.0000 mg | ORAL_TABLET | Freq: Two times a day (BID) | ORAL | 3 refills | Status: DC

## 2023-10-07 MED ORDER — TIRZEPATIDE 10 MG/0.5ML ~~LOC~~ SOAJ: 10.0000 mg | SUBCUTANEOUS | 3 refills | Status: AC

## 2023-10-07 NOTE — Progress Notes (Signed)
 Name: Mary Moyer  MRN/ DOB: [id number], 1970-04-05   Age/ Sex: 53 y.o., female    PCP: Lucius Krabbe, NP   Reason for Endocrinology Evaluation: Type 2 Diabetes Mellitus     Date of Initial Endocrinology Visit: 12/16/2020    PATIENT IDENTIFIER: Mary Moyer is a 53 y.o. female with a past medical history of T2DM, Hypothyroidism and HTN, S/P right nephrectomy due to renal cell carcinoma (03/2021) . the patient presented for initial endocrinology clinic visit on 12/16/2020 for consultative assistance with her diabetes management.    HPI: Mary Moyer was    Diagnosed with DM 2020 Prior Medications tried/Intolerance: Glipizide , Metformin              Hemoglobin A1c has ranged from 7.4% in 2020, peaking at 11.5% in 2021.   On her initial visit to our clinic she had an A1c of 8.8%, she needed clearance for right nephrectomy after optimization of glucose control.  We started Rybelsus , continue glipizide  and metformin   She was also started on atorvastatin  10 mg with an LDL of 128 mg/DL  Stopped glipizide  07/2021 with an A1c of 6.1%,  Started glimepiride  01/2022 due to A1c 8.2%  Started jardiance  and stopped Glimepiride  12/2022 with an A1c 7.1% . She had stopped Glimepiride  due to a recall   Switch Rybelsus  to Mounjaro  02/2023   THYROID  HISTORY: She has been diagnosed with hypothyroidism in 2000. Denies prior neck sx or RAI ablation    Denies local neck symptoms Denies constipation   No FH of thyroid   Father with T2DM   Atorvastatin  was discontinued due to elevated LFTs in 2024  SUBJECTIVE:   During the last visit (04/07/2023): A1c 6.6%    Today (12/07/22): Mary Moyer is here for a followup on diabetes management. She checks her  blood sugars 0 times daily   She is S/P right renal mass resection 03/2021 with pathology report consistent with clear cell renal carcinoma, continues with nephrology follow-up  Patient has been noted for weight loss since her last visit  here~10 LB's No nausea or vomiting  Denies constipation per se but has BM every 2-3 days  No local neck swelling  No palpitations , has a pending cardiologist through PCP due to bradycardia   She does do walking exercises as well as Zumba class She self discontinued atorvastatin  due to elevated Lft's     HOME DIABETES REGIMEN: Metformin  850 mg  BID Jardiance  25 mg daily  Mounjaro  7.5 mg weekly  Levothyroxine  175 mcg, daily      Statin: yes  ACE-I/ARB: yes Prior Diabetic Education: no     METER DOWNLOAD SUMMARY: n/a   DIABETIC COMPLICATIONS: Microvascular complications:   Denies: CKD, retinopathy , neuropathy  Last eye exam: Completed 2021  Macrovascular complications:   Denies: CAD, PVD, CVA   PAST HISTORY: Past Medical History:  Past Medical History:  Diagnosis Date   Anemia    Anovulation 12/23/1999   Anxiety    Arthritis    Cancer (HCC)    Chronic kidney disease    Depression    Diabetes (HCC)    Dyslipidemia 12/17/2020   Elevated LFTs 07/31/2019   Fatty liver    Headache    Hyperlipidemia 03/22/2018   Hypertension    Hypothyroidism    Hypothyroidism 03/22/2018   Migraines    Polycystic ovaries 06/12/1999   Thyroid  disease    UTI (urinary tract infection)    Past Surgical History:  Past Surgical History:  Procedure Laterality Date  ABLATION     ROBOT ASSISTED LAPAROSCOPIC NEPHRECTOMY Right 03/26/2021   Procedure: XI ROBOTIC ASSISTED LAPAROSCOPIC NEPHRECTOMY;  Surgeon: Devere Lonni Righter, MD;  Location: [facility];  Service: Urology;  Laterality: Right;   UTERINE FIBROID SURGERY  2003    Social History:  reports that she has never smoked. She has never used smokeless tobacco. She reports that she does not currently use alcohol. She reports that she does not use drugs. Family History:  Family History  Problem Relation Age of Onset   Kidney disease Mother    Arthritis Mother    Hypertension Mother    Miscarriages / [place] Mother     Depression Mother    Diabetes Father    Heart attack Father    Heart disease Father    Hyperlipidemia Father    Hypertension Father    Depression Sister    Heart disease Sister    Hypertension Sister    Asthma Daughter    Depression Daughter    Diabetes Daughter    Hypertension Daughter    Miscarriages / [place] Daughter    Kidney disease Maternal Grandmother    Miscarriages / Stillbirths Maternal Grandmother    Hypertension Paternal Grandmother    Heart attack Paternal Grandmother    Cancer Paternal Grandfather    Diabetes Paternal Grandfather    Hypertension Paternal Grandfather    Colon cancer Neg Hx    Esophageal cancer Neg Hx    Pancreatic cancer Neg Hx    Stomach cancer Neg Hx    Breast cancer Neg Hx      HOME MEDICATIONS: Allergies as of 10/07/2023       Reactions   Amlodipine  Other (See Comments)   Headache with 5mg    Orange Fruit [citrus]    migrains--headache   Latex Rash        Medication List        Accurate as of October 07, 2023  7:55 AM. If you have any questions, ask your nurse or doctor.          B-12 50 MCG Tabs Take 1 tablet by mouth daily.   blood glucose meter kit and supplies Kit Dispense based on patient and insurance preference. Use once daily before breakfast. E11.65   empagliflozin  25 MG Tabs tablet Commonly known as: Jardiance  Take 1 tablet (25 mg total) by mouth daily before breakfast.   levothyroxine  175 MCG tablet Commonly known as: SYNTHROID  Take 1 tablet (175 mcg total) by mouth daily.   lisinopril  10 MG tablet Commonly known as: ZESTRIL  Take 1 tablet (10 mg total) by mouth daily.   metFORMIN  850 MG tablet Commonly known as: GLUCOPHAGE  Take 1 tablet by mouth twice daily with food   metroNIDAZOLE  1 % gel Commonly known as: Metrogel  Apply topically daily. Apply to face one time daily.   OneTouch Delica Plus Lancet33G Misc USE 1 LANCET FOR FINGERSTICK TO CHECK GLUCOSE TWICE DAILY   OneTouch Verio test  strip Generic drug: glucose blood 1 each by Other route in the morning and at bedtime. Use as instructed   tirzepatide  7.5 MG/0.5ML Pen Commonly known as: MOUNJARO  Inject 7.5 mg into the skin once a week.   valACYclovir  1000 MG tablet Commonly known as: VALTREX  Take 2 tablets (2,000 mg total) by mouth 2 (two) times daily. Take for 1 day at first sign of tingling.   Vitamin D3 50 MCG (2000 UT) Chew Chew 1 tablet by mouth daily at 6 (six) AM.  ALLERGIES: Allergies  Allergen Reactions   Amlodipine  Other (See Comments)    Headache with 5mg    Orange Fruit [Citrus]     migrains--headache   Latex Rash    OBJECTIVE:   VITAL SIGNS: BP 110/70 (BP Location: Left Arm, Patient Position: Sitting, Cuff Size: Large)   Pulse 71   Ht 5' 7 (1.702 m)   Wt 282 lb 6.4 oz (128.1 kg)   SpO2 98%   BMI 44.23 kg/m      Filed Weights   10/07/23 0752  Weight: 282 lb 6.4 oz (128.1 kg)     PHYSICAL EXAM:  General: Pt appears well and is in NAD  Neck: General: Supple without adenopathy or carotid bruits. Thyroid : Thyroid  size normal.  No goiter or nodules appreciated.  Lungs: Clear with good BS bilat   Heart: RRR   Extremities:  Lower extremities - No pretibial edema.   Neuro: MS is good with appropriate affect, pt is alert and Ox3   DM Foot Exam 10/07/2023 The skin of the feet is intact without sores or ulcerations. The pedal pulses are 2+ on right and 2+ on left. The sensation is intact to a screening 5.07, 10 gram monofilament bilaterally  DATA REVIEWED:  Lab Results  Component Value Date   HGBA1C 6.6 (A) 04/07/2023   HGBA1C 7.1 (A) 12/07/2022   HGBA1C 7.2 (A) 05/29/2022    Latest Reference Range & Units 10/01/23 10:44  Sodium 135 - 145 mEq/L 136  Potassium 3.5 - 5.1 mEq/L 4.5  Chloride 96 - 112 mEq/L 104  CO2 19 - 32 mEq/L 25  Glucose 70 - 99 mg/dL 897 (H)  BUN 6 - 23 mg/dL 24 (H)  Creatinine 9.59 - 1.20 mg/dL 8.98  Calcium  8.4 - 10.5 mg/dL 9.7  Alkaline  Phosphatase 39 - 117 U/L 51  Albumin 3.5 - 5.2 g/dL 4.4  AST 0 - 37 U/L 21  ALT 0 - 35 U/L 25  Total Protein 6.0 - 8.3 g/dL 7.6  Total Bilirubin 0.2 - 1.2 mg/dL 0.3  GFR >39.99 mL/min 64.29  Total CHOL/HDL Ratio  5  Cholesterol 0 - 200 mg/dL 811  HDL Cholesterol >60.99 mg/dL 64.19 (L)  LDL (calc) 0 - 99 mg/dL 878 (H)  NonHDL  847.87  Triglycerides 0.0 - 149.0 mg/dL 841.9 (H)  VLDL 0.0 - 59.9 mg/dL 68.3      In office BG 123 mg/dL    ASSESSMENT / PLAN / RECOMMENDATIONS:   1) Type 2 Diabetes Mellitus, Optimally controlled, With CKD III complications - Most recent A1c of 5.8 %. Goal A1c < 7.0 %.    - A1c optimal - Discontinue glimepiride , no intolerance issues. -A1c is 5.8% I have recommended decreasing metformin , will increase Mounjaro  to improve weight loss as her weight has plateaued -Encouraged to continue with lifestyle changes MEDICATIONS:  Continue Jardiance  25 mg daily Decrease metformin  850 mg , 1 tablet daily Increase Mounjaro  10 mg weekly  EDUCATION / INSTRUCTIONS: BG monitoring instructions: Patient is instructed to check her blood sugars 1 times a day, fasting . Call [facility] if: BG persistently < 70  I reviewed the Rule of 15 for the treatment of hypoglycemia in detail with the patient. Literature supplied.   2) Diabetic complications:  Eye: Does not have known diabetic retinopathy.  Neuro/ Feet: Does not have known diabetic peripheral neuropathy. Renal: Patient does not have known baseline CKD. She is  on an ACEI/ARB at present.   3) Hypothyroidism:   - Patient  is clinically euthyroid - No changes   Medication  Levothyroxine  175 mcg daily  4)Dyslipidemia:  - She was on atorvastatin  but this was discontinued in due to elevated LFTs - We discussed the importance of statin therapy to prevent cardiovascular events.  Her PCP discussed this as well - Patient does not feel well on statins and would like to see a dietitian - A  referral has been placed  F/U in 6 months   Signed electronically by: Stefano Redgie Butts, MD  [facility]  [facility] [address] [place] [place], [place] [address] Phone: [phone] FAX: [phone]   CC: Lucius Krabbe, NP [address] [place] [address] Phone: [phone]  Fax: [phone]    Return to Endocrinology clinic as below: Future Appointments  Date Time Provider Department Center  05/08/2024  4:00 PM Orman Erminio POUR, PA-C [facility] None

## 2023-10-07 NOTE — Patient Instructions (Signed)
-   Continue Jardiance  25  mg, 1 tablet  - Decrease metformin  850 mg , 1 tablet daily - Increase Mounjaro  10 mg weekly      HOW TO TREAT LOW BLOOD SUGARS (Blood sugar LESS THAN 70 MG/DL) Please follow the RULE OF 15 for the treatment of hypoglycemia treatment (when your (blood sugars are less than 70 mg/dL)   STEP 1: Take 15 grams of carbohydrates when your blood sugar is low, which includes:  3-4 GLUCOSE TABS  OR 3-4 OZ OF JUICE OR REGULAR SODA OR ONE TUBE OF GLUCOSE GEL    STEP 2: RECHECK blood sugar in 15 MINUTES STEP 3: If your blood sugar is still low at the 15 minute recheck --> then, go back to STEP 1 and treat AGAIN with another 15 grams of carbohydrates.

## 2023-10-28 ENCOUNTER — Encounter: Payer: Self-pay | Admitting: Internal Medicine

## 2023-10-28 ENCOUNTER — Telehealth: Payer: Self-pay

## 2023-10-28 ENCOUNTER — Other Ambulatory Visit (HOSPITAL_COMMUNITY): Payer: Self-pay

## 2023-10-28 NOTE — Telephone Encounter (Signed)
 Pharmacy Patient Advocate Encounter   Received notification from Pt Calls Messages that prior authorization for Mounjaro  10MG /0.5ML auto-injectors is required/requested.   Insurance verification completed.   The patient is insured through [employer].   Per test claim: PA required and submitted KEY/EOC/Request #: [id number] due to MOUNJARO  10MG /0.5ML PEN INJCTR is covered for this Member without Prior Authorization.   30 day copay is $30

## 2023-10-28 NOTE — Telephone Encounter (Signed)
 PA needed on the Mounjaro   10mg  dose.

## 2023-11-17 ENCOUNTER — Encounter: Payer: Self-pay | Admitting: Cardiology

## 2023-11-17 DIAGNOSIS — R002 Palpitations: Secondary | ICD-10-CM | POA: Insufficient documentation

## 2023-11-17 NOTE — Progress Notes (Signed)
 Cardiology Office Note   Date:  11/18/2023   ID:  Mary Moyer, DOB 1970/07/22, MRN [id number]  PCP:  Lucius Krabbe, NP  Cardiologist:   None Referring:  Lucius Krabbe, NP  No chief complaint on file.     History of Present Illness: Mary Moyer is a 53 y.o. female who presents for evaluation of ventricular trigeminy.  She was referred by Lucius Krabbe, NP.   This was noted during a recent exam.  She was noted to have ectopy and was found to have trigeminy.  I reviewed this EKG.  She does not feel this even in retrospect.  She did have a echo in 2008 without abnormalities.  She does not recall this and does not know why she had this.  The patient denies any new symptoms such as chest discomfort, neck or arm discomfort. There has been no new shortness of breath, PND or orthopnea. There have been no reported palpitations, presyncope or syncope.  She is exercising and has had no cardiovascular complaints.  She said at the time her heart rate was recorded in the 40s as it is today but her blood pressure was excellent.  Of note she is status post right nephrectomy for kidney cancer in the past.   Past Medical History:  Diagnosis Date   Anemia    Anovulation 12/23/1999   Anxiety    Arthritis    Chronic kidney disease    Depression    Diabetes (HCC)    Dyslipidemia 12/17/2020   Elevated LFTs 07/31/2019   Fatty liver    Hyperlipidemia 03/22/2018   Hypertension    Hypothyroidism 03/22/2018   Migraines    Polycystic ovaries 06/12/1999   Thyroid  disease    UTI (urinary tract infection)     Past Surgical History:  Procedure Laterality Date   ABLATION     ROBOT ASSISTED LAPAROSCOPIC NEPHRECTOMY Right 03/26/2021   Procedure: XI ROBOTIC ASSISTED LAPAROSCOPIC NEPHRECTOMY;  Surgeon: Devere Lonni Righter, MD;  Location: [facility];  Service: Urology;  Laterality: Right;   UTERINE FIBROID SURGERY  2003     Current Outpatient Medications  Medication Sig Dispense  Refill   blood glucose meter kit and supplies KIT Dispense based on patient and insurance preference. Use once daily before breakfast. E11.65 1 each 0   empagliflozin  (JARDIANCE ) 25 MG TABS tablet Take 1 tablet (25 mg total) by mouth daily before breakfast. 90 tablet 3   glucose blood (ONETOUCH VERIO) test strip 1 each by Other route in the morning and at bedtime. Use as instructed 200 each 3   Lancets (ONETOUCH DELICA PLUS LANCET33G) MISC USE 1 LANCET FOR FINGERSTICK TO CHECK GLUCOSE TWICE DAILY 100 each 5   levothyroxine  (SYNTHROID ) 175 MCG tablet Take 1 tablet (175 mcg total) by mouth daily. 90 tablet 3   lisinopril  (ZESTRIL ) 10 MG tablet Take 1 tablet (10 mg total) by mouth daily. 90 tablet 3   metFORMIN  (GLUCOPHAGE ) 850 MG tablet Take 850 mg by mouth daily with breakfast.     metroNIDAZOLE  (METROGEL ) 1 % gel Apply topically daily. Apply to face one time daily. 45 g 0   tirzepatide  (MOUNJARO ) 10 MG/0.5ML Pen Inject 10 mg into the skin once a week. 6 mL 3   valACYclovir  (VALTREX ) 1000 MG tablet Take 2 tablets (2,000 mg total) by mouth 2 (two) times daily. Take for 1 day at first sign of tingling. 20 tablet 1   No current facility-administered medications for this visit.    Allergies:  Amlodipine , Orange fruit [citrus], and Latex    Social History:  The patient  reports that she has never smoked. She has never used smokeless tobacco. She reports that she does not currently use alcohol. She reports that she does not use drugs.   Family History:  The patient's family history includes Arthritis in her mother; Asthma in her daughter; Cancer in her paternal grandfather; Depression in her daughter, mother, and sister; Diabetes in her daughter, father, and paternal grandfather; Heart attack in her father and paternal grandmother; Heart disease in her father and sister; Hyperlipidemia in her father; Hypertension in her daughter, father, mother, paternal grandfather, paternal grandmother, and sister;  Kidney disease in her maternal grandmother and mother; Miscarriages / Stillbirths in her daughter, maternal grandmother, and mother.    ROS:  Please see the history of present illness.   Otherwise, review of systems are positive for none.   All other systems are reviewed and negative.    PHYSICAL EXAM: VS:  BP 120/70   Pulse (!) 48   Ht 5' 7.5 (1.715 m)   Wt 280 lb (127 kg)   SpO2 97%   BMI 43.21 kg/m  , BMI Body mass index is 43.21 kg/m. GENERAL:  Well appearing HEENT:  Pupils equal round and reactive, fundi not visualized, oral mucosa unremarkable NECK:  No jugular venous distention, waveform within normal limits, carotid upstroke brisk and symmetric, no bruits, no thyromegaly LYMPHATICS:  No cervical, inguinal adenopathy LUNGS:  Clear to auscultation bilaterally BACK:  No CVA tenderness CHEST:  Unremarkable HEART:  PMI not displaced or sustained,S1 and S2 within normal limits, no S3, no S4, no clicks, no rubs, 2 out of 6 brief apical systolic murmur heard post PVC, no diastolic murmurs ABD:  Flat, positive bowel sounds normal in frequency in pitch, no bruits, no rebound, no guarding, no midline pulsatile mass, no hepatomegaly, no splenomegaly EXT:  2 plus pulses throughout, no edema, no cyanosis no clubbing SKIN:  No rashes no nodules NEURO:  Cranial nerves II through XII grossly intact, motor grossly intact throughout [facility]:  Cognitively intact, oriented to person place and time    EKG:      10/01/2023    normal sinus rhythm, rate 80, ventricular ectopy in a bigeminal pattern, no acute ST-T wave changes.    Recent Labs: 12/07/2022: TSH 0.77 10/01/2023: ALT 25; BUN 24; Creatinine, Ser 1.01; Hemoglobin 12.7; Platelets 300.0; Potassium 4.5; Sodium 136    Lipid Panel    Component Value Date/Time   CHOL 188 10/01/2023 1044   TRIG 158.0 (H) 10/01/2023 1044   HDL 35.80 (L) 10/01/2023 1044   CHOLHDL 5 10/01/2023 1044   VLDL 31.6 10/01/2023 1044   LDLCALC 121 (H) 10/01/2023  1044   LDLCALC 105 (H) 12/07/2022 0819   LDLDIRECT 160.0 03/22/2018 1208      Wt Readings from Last 3 Encounters:  11/18/23 280 lb (127 kg)  10/07/23 282 lb 6.4 oz (128.1 kg)  10/01/23 284 lb 6 oz (129 kg)      Other studies Reviewed: Additional studies/ records that were reviewed today include: EKG. Review of the above records demonstrates:  Please see elsewhere in the note.     ASSESSMENT AND PLAN:   PVCs: The patient does have frequent ectopy.  There is a very slight post PVC murmur.  I am going to order an echocardiogram and a 3-day ZIO.  I do see that her labs are unremarkable.  Her thyroid  is managed and she does take  meds as above.  Her electrolytes are unremarkable.  She is asymptomatic so it is unlikely I will need any other therapy for this.  We had a discussion about this.  Murmur: I will rule out any hypertrophic cardiomyopathy.  Current medicines are reviewed at length with the patient today.  The patient does not have concerns regarding medicines.  The following changes have been made:  no change  Labs/ tests ordered today include:   Orders Placed This Encounter  Procedures   LONG TERM MONITOR (3-14 DAYS)   ECHOCARDIOGRAM COMPLETE     Disposition:   FU with with me as needed   Signed, Lynwood Schilling, MD  11/18/2023 12:34 PM    [facility]

## 2023-11-18 ENCOUNTER — Ambulatory Visit

## 2023-11-18 ENCOUNTER — Ambulatory Visit: Attending: Cardiology | Admitting: Cardiology

## 2023-11-18 ENCOUNTER — Encounter: Payer: Self-pay | Admitting: Cardiology

## 2023-11-18 VITALS — BP 120/70 | HR 48 | Ht 67.5 in | Wt 280.0 lb

## 2023-11-18 DIAGNOSIS — E785 Hyperlipidemia, unspecified: Secondary | ICD-10-CM

## 2023-11-18 DIAGNOSIS — I10 Essential (primary) hypertension: Secondary | ICD-10-CM

## 2023-11-18 DIAGNOSIS — R002 Palpitations: Secondary | ICD-10-CM | POA: Diagnosis not present

## 2023-11-18 DIAGNOSIS — I493 Ventricular premature depolarization: Secondary | ICD-10-CM | POA: Diagnosis not present

## 2023-11-18 NOTE — Progress Notes (Unsigned)
 Enrolled for [employer] to mail a ZIO XT long term holter monitor to the patients address on file.

## 2023-11-18 NOTE — Patient Instructions (Signed)
 Medication Instructions:  Your physician recommends that you continue on your current medications as directed. Please refer to the Current Medication list given to you today.  *If you need a refill on your cardiac medications before your next appointment, please call your pharmacy*  Lab Work: NONE If you have labs (blood work) drawn today and your tests are completely normal, you will receive your results only by: MyChart Message (if you have MyChart) OR A paper copy in the mail If you have any lab test that is abnormal or we need to change your treatment, we will call you to review the results.  Testing/Procedures: Echocardiogram Your physician has requested that you have an echocardiogram. Echocardiography is a painless test that uses sound waves to create images of your heart. It provides your doctor with information about the size and shape of your heart and how well your heart's chambers and valves are working. This procedure takes approximately one hour. There are no restrictions for this procedure. Please do NOT wear cologne, perfume, aftershave, or lotions (deodorant is allowed). Please arrive 15 minutes prior to your appointment time.  Please note: We ask at that you not bring children with you during ultrasound (echo/ vascular) testing. Due to room size and safety concerns, children are not allowed in the ultrasound rooms during exams. Our front office staff cannot provide observation of children in our lobby area while testing is being conducted. An adult accompanying a patient to their appointment will only be allowed in the ultrasound room at the discretion of the ultrasound technician under special circumstances. We apologize for any inconvenience.  3 Day Zio Heart Monitor Your physician has requested that you wear a Zio heart monitor for __3___ days. This will be mailed to your home with instructions on how to apply the monitor and how to return it when finished. Please allow 2  weeks after returning the heart monitor before our office calls you with the results.   Follow-Up: At [facility], you and your health needs are our priority.  As part of our continuing mission to provide you with exceptional heart care, our providers are all part of one team.  This team includes your primary Cardiologist (physician) and Advanced Practice Providers or APPs (Physician Assistants and Nurse Practitioners) who all work together to provide you with the care you need, when you need it.  Your next appointment:   As needed  Provider:   Lavona, MD  We recommend signing up for the patient portal called MyChart.  Sign up information is provided on this After Visit Summary.  MyChart is used to connect with patients for Virtual Visits (Telemedicine).  Patients are able to view lab/test results, encounter notes, upcoming appointments, etc.  Non-urgent messages can be sent to your provider as well.   To learn more about what you can do with MyChart, go to [email or web address].   Other Instructions ZIO XT- Long Term Monitor Instructions  Your physician has requested you wear a ZIO patch monitor for 3 days.  This is a single patch monitor. [employer] supplies one patch monitor per enrollment. Additional stickers are not available. Please do not apply patch if you will be having a Nuclear Stress Test,  Echocardiogram, Cardiac CT, MRI, or Chest Xray during the period you would be wearing the  monitor. The patch cannot be worn during these tests. You cannot remove and re-apply the  ZIO XT patch monitor.  Your ZIO patch monitor will be mailed 3 day USPS to  your address on file. It may take 3-5 days  to receive your monitor after you have been enrolled.  Once you have received your monitor, please review the enclosed instructions. Your monitor  has already been registered assigning a specific monitor serial # to you.  Billing and Patient Assistance Program Information  We have  supplied [employer] with any of your insurance information on file for billing purposes. [employer] offers a sliding scale Patient Assistance Program for patients that do not have  insurance, or whose insurance does not completely cover the cost of the ZIO monitor.  You must apply for the Patient Assistance Program to qualify for this discounted rate.  To apply, please call [employer] at [phone], select option 4, select option 2, ask to apply for  Patient Assistance Program. Meredeth will ask your household income, and how many people  are in your household. They will quote your out-of-pocket cost based on that information.  [employer] will also be able to set up a 28-month, interest-free payment plan if needed.  Applying the monitor   Shave hair from upper left chest.  Hold abrader disc by orange tab. Rub abrader in 40 strokes over the upper left chest as  indicated in your monitor instructions.  Clean area with 4 enclosed alcohol pads. Let dry.  Apply patch as indicated in monitor instructions. Patch will be placed under collarbone on left  side of chest with arrow pointing upward.  Rub patch adhesive wings for 2 minutes. Remove white label marked 1. Remove the white  label marked 2. Rub patch adhesive wings for 2 additional minutes.  While looking in a mirror, press and release button in center of patch. A small green light will  flash 3-4 times. This will be your only indicator that the monitor has been turned on.  Do not shower for the first 24 hours. You may shower after the first 24 hours.  Press the button if you feel a symptom. You will hear a small click. Record Date, Time and  Symptom in the Patient Logbook.  When you are ready to remove the patch, follow instructions on the last 2 pages of Patient  Logbook. Stick patch monitor onto the last page of Patient Logbook.  Place Patient Logbook in the blue and white box. Use locking tab on box and tape box closed  securely. The blue and  white box has prepaid postage on it. Please place it in the mailbox as  soon as possible. Your physician should have your test results approximately 7 days after the  monitor has been mailed back to [facility].  Call [facility] Customer Care at [phone] if you have questions regarding  your ZIO XT patch monitor. Call them immediately if you see an orange light blinking on your  monitor.  If your monitor falls off in less than 4 days, contact our Monitor department at [phone].  If your monitor becomes loose or falls off after 4 days call [employer] at [phone] for  suggestions on securing your monitor

## 2023-11-22 ENCOUNTER — Encounter: Attending: Internal Medicine | Admitting: Dietician

## 2023-11-22 DIAGNOSIS — E1165 Type 2 diabetes mellitus with hyperglycemia: Secondary | ICD-10-CM | POA: Insufficient documentation

## 2023-11-22 NOTE — Patient Instructions (Addendum)
 Dr. Idell PT videos Be consistent with exercise - aim for 30 mintues per day for at least 5 days per week. Avoid foods with added Phos... Mindful eating - slowly increase your fiber DASH diet - increased plants/beans Great job on your water  intake!   Eat more Non-Starchy Vegetables These include greens, broccoli, cauliflower, cabbage, carrots, beets, eggplant, peppers, squash and others. Minimize added sugars and refined grains Rethink what you drink.  Choose beverages without added sugar.  Look for 0 carbs on the label. See the list of whole grains below.  Find alternatives to usual sweet treats. Choose whole foods over processed. Make simple meals at home more often than eating out.  Tips to increase fiber in your diet: (All plants have fiber.  Eat a variety. There are more than are on this list.) Slowly increase the amount of fiber you eat to 25-35 grams per day.  (More is fine if you tolerate it.) Fiber from whole grains, nuts and seeds Quinoa, 1/2 cup = 5 grams Bulgur, 1/2 cup = 4.1 grams Popcorn, 3 cups = 3.6 grams Whole Wheat Spaghetti, 1/2 cup = 3.2 grams Barley, 1/2 cup = 3 grams Oatmeal, 1/2 cup = 2 grams Whole Wheat English Muffin = 3 grams Corn, 1/2 cup = 2.1 grams Brown Rice, 1/2 cup = 1.8 grams Flax seeds, 1 Tablespoon = 2.8 grams Chia seeds, 1 Tablespoon = 11 grams Almonds, 1 ounce = 3.5 grams fiber Fiber from legumes Kidney beans, 1/2 cup 7.9 grams Lentils, 1/2 cup = 7.8 grams Pinto beans, 1/2 cup = 7.7 grams Black beans, 1/2 cup = 7.6 grams Lima beans, 1/2 cup 6.4 grams Chick peas, 1/2 cup = 5.3 grams Black eyed peas, 1/2 cup = 4 grams Fiber from fruits and vegetables Pear, 6 grams Apple. 3.3 grams Raspberries or Blackberries, 3/4 cup = 6 grams Strawberries or Blueberries, 1 cup = 3.4 grams Baked sweet potato 3.8 grams fiber Baked potato with skin 4.4 grams  Peas, 1/2 cup = 4.4 grams  Spinach, 1/2 cup cooked = 3.5 grams  Avocado, 1/2 = 5  grams

## 2023-11-22 NOTE — Progress Notes (Signed)
 Medical Nutrition Therapy  Appointment Start time:  56 (late)  Appointment End time:  1730  Primary concerns today: Patient is here today alone. She is working on losing weight. She had a recent cardiology appointment and they will do an echo and she will wear a heart monitor. Constipation after starting Mounjaro . She speaks to a coach at her insurance company.  Referral diagnosis: dyslipidemia Preferred learning style: hands on Learning readiness: ready, change in progress   NUTRITION ASSESSMENT  67.5 284 lbs 11/22/2023 306 lbs 04/2023  Clinical Medical Hx:  Type 2 (2022), kidney cancer (2022), nephrectomy (2023), hypothyroidism, HLD, HTN, CKD, depression/anxiety, fatty liver Medications: Mounjaro  04/2023, Jardiance , Metformin , levothyroxine  Labs: A1C 5.8% 10/07/2023 decreased from 6.6% 04/07/2023, BUN 24, Creatine 1.01, Potassium 4.5, GFR 64 on 10/01/2023, Vitamin B-12 302 on 07/15/2021, Vitamin D  22 on 07/15/2021 Lipid Panel     Component Value Date/Time   CHOL 188 10/01/2023 1044   TRIG 158.0 (H) 10/01/2023 1044   HDL 35.80 (L) 10/01/2023 1044   CHOLHDL 5 10/01/2023 1044   VLDL 31.6 10/01/2023 1044   LDLCALC 121 (H) 10/01/2023 1044   LDLCALC 105 (H) 12/07/2022 0819   LDLDIRECT 160.0 03/22/2018 1208    Notable Signs/Symptoms: none  Lifestyle & Dietary Hx Patient lives with her boyfriend.  Patient does the shopping and cooking. 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 Eating more fish and 2 vegetarian meals per week.  Allergic to citrus fruit- causes migraines.  Estimated daily fluid intake: 90 oz Supplements: Vitamin D , Vitamin B-12 Sleep: poor - peri-menopause Stress / self-care: low stress Current average weekly physical activity: Zumba on Wednesday, walks 2 days per week for 1 mile, she just bought resistance bands  24-Hr Dietary Recall First Meal: greek yogurt Snack: none Second Meal: ham and cheese on thin sliced [employer] bread, carrots and  hummus, protein milk Snack: occasional  Third Meal: potato soup, cheddar biscuits Snack: none Beverages: water , occasional diet coke  Estimated Energy Needs Calories: 1400-1500 Carbohydrate: 170 g Protein: 80-90 g Fat: 45 g   NUTRITION DIAGNOSIS  NB-1.1 Food and nutrition-related knowledge deficit As related to balance of carbohydrates, protein, and fat for diabetes, renal health, and hyperlipidemia.  As evidenced by diet hx and patient report.   NUTRITION INTERVENTION  Nutrition education (E-1) on the following topics:  Benefits of exercise and patient goals.  Discussed adding resistance training and patient is ready to proceed.   Basics of CKD diet - Avoid foods with added phos..., reduce sodium intake, more plant protein and limit red meat. Hyperlipidemia, cholesterol, triglyceride, HDL, and LDL goals and impact of weight loss, exercise, and types of fat. Balance of nutrition for continued good blood glucose control Evaluation of diet which is adequate in protein but low in fiber.  Goal is to increase plant foods.  Sources of fiber discussed as well as slow increase of fiber to goal of 25 grams daily.  Handouts Provided Include   Types of fat Cholesterol and triglycerides  Learning Style & Readiness for Change Teaching method utilized: Visual & Auditory  Demonstrated degree of understanding via: Teach Back  Barriers to learning/adherence to lifestyle change: none  Goals Established by Pt  Dr. Idell PT videos Be consistent with exercise - aim for 30 mintues per day for at least 5 days per week. Avoid foods with added Phos... Mindful eating - slowly increase your fiber DASH diet - increased plants/beans Great job on your water  intake!   Eat more  Non-Starchy Vegetables These include greens, broccoli, cauliflower, cabbage, carrots, beets, eggplant, peppers, squash and others. Minimize added sugars and refined grains Rethink what you drink.  Choose beverages without added  sugar.  Look for 0 carbs on the label. See the list of whole grains below.  Find alternatives to usual sweet treats. Choose whole foods over processed. Make simple meals at home more often than eating out.  Tips to increase fiber in your diet: (All plants have fiber.  Eat a variety. There are more than are on this list.) Slowly increase the amount of fiber you eat to 25-35 grams per day.  (More is fine if you tolerate it.) Fiber from whole grains, nuts and seeds Quinoa, 1/2 cup = 5 grams Bulgur, 1/2 cup = 4.1 grams Popcorn, 3 cups = 3.6 grams Whole Wheat Spaghetti, 1/2 cup = 3.2 grams Barley, 1/2 cup = 3 grams Oatmeal, 1/2 cup = 2 grams Whole Wheat English Muffin = 3 grams Corn, 1/2 cup = 2.1 grams Brown Rice, 1/2 cup = 1.8 grams Flax seeds, 1 Tablespoon = 2.8 grams Chia seeds, 1 Tablespoon = 11 grams Almonds, 1 ounce = 3.5 grams fiber Fiber from legumes Kidney beans, 1/2 cup 7.9 grams Lentils, 1/2 cup = 7.8 grams Pinto beans, 1/2 cup = 7.7 grams Black beans, 1/2 cup = 7.6 grams Lima beans, 1/2 cup 6.4 grams Chick peas, 1/2 cup = 5.3 grams Black eyed peas, 1/2 cup = 4 grams Fiber from fruits and vegetables Pear, 6 grams Apple. 3.3 grams Raspberries or Blackberries, 3/4 cup = 6 grams Strawberries or Blueberries, 1 cup = 3.4 grams Baked sweet potato 3.8 grams fiber Baked potato with skin 4.4 grams  Peas, 1/2 cup = 4.4 grams  Spinach, 1/2 cup cooked = 3.5 grams  Avocado, 1/2 = 5 grams   MONITORING & EVALUATION Dietary intake, weekly physical activity, and label reading in 2 month.  Next Steps  Patient is to call for questions. SABRA

## 2023-12-07 ENCOUNTER — Encounter: Payer: Self-pay | Admitting: Internal Medicine

## 2023-12-12 ENCOUNTER — Ambulatory Visit: Payer: Self-pay | Admitting: Cardiology

## 2023-12-12 DIAGNOSIS — I493 Ventricular premature depolarization: Secondary | ICD-10-CM

## 2023-12-23 ENCOUNTER — Telehealth: Payer: Self-pay | Admitting: Cardiology

## 2023-12-23 NOTE — Telephone Encounter (Signed)
 Spoke with Pt. Advised pt I would send to Dr Lavona to make sure if would be ok to cancel the Echo. Pt stated understanding.

## 2023-12-23 NOTE — Telephone Encounter (Signed)
 Per PT DR. Hochrein said her heart monitor results looks fine and was wondering if she still needed the echo appt for 12/27/23. Please advise

## 2023-12-24 NOTE — Telephone Encounter (Signed)
 Patient canceled her Echocardiogram test on Monday and wants a call back to discuss other test results.

## 2023-12-24 NOTE — Telephone Encounter (Signed)
 Spoke with patient in regards to echo. Cancelled her echo appt and wanting to follow up with Dr Lavona to see if this is something she needs to have done. Pt stated she would have to come out of pocket for this and didn't want to if this wasn't needed. Will send to Dr Lavona for further advisement. Pt verbalized understanding of plan.

## 2023-12-27 ENCOUNTER — Ambulatory Visit (HOSPITAL_COMMUNITY)

## 2023-12-27 NOTE — Telephone Encounter (Signed)
 Called patient back with Dr. Denver message. Patient stated she does not think she needs an appointment to see Dr. Lavona at this time. She stated it would help to have the echocardiogram scheduled out, like 6 months, due to finances. Will send message to echo dept to help schedule.

## 2024-01-04 ENCOUNTER — Other Ambulatory Visit: Payer: Self-pay | Admitting: Internal Medicine

## 2024-01-04 ENCOUNTER — Encounter: Payer: Self-pay | Admitting: Internal Medicine

## 2024-01-04 MED ORDER — EMPAGLIFLOZIN 25 MG PO TABS: 25.0000 mg | ORAL_TABLET | Freq: Every day | ORAL | 3 refills | Status: AC

## 2024-01-04 NOTE — Addendum Note (Signed)
 Addended by: Fernande Treiber M on: 01/04/2024 12:02 PM   Modules accepted: Orders

## 2024-01-24 ENCOUNTER — Encounter: Attending: Family | Admitting: Dietician

## 2024-01-24 DIAGNOSIS — E1165 Type 2 diabetes mellitus with hyperglycemia: Secondary | ICD-10-CM | POA: Diagnosis present

## 2024-01-24 NOTE — Patient Instructions (Addendum)
 Increase your water  intake. Tie your exercise to something you enjoy such as watching a certain video.  Aim for 30 minutes 3 times per week and work up to 5 days per week. Continue your high fiber diet, increased plants, legumes.

## 2024-01-24 NOTE — Progress Notes (Signed)
 " Medical Nutrition Therapy  Appointment Start time:  [id number]  Appointment End time:  1700  Primary concerns today: Patient is here today alone and was last seen by this RD on 11/22/2023  She is working on losing weight. She had a recent cardiology appointment and states that tests were normal. Constipation after starting Mounjaro .  She has increased her fiber (frequently to 25 grams per day) and states this has improved with BM q 2 days. She is working to increase her water  intake but needs to increase today. Made a New Years resolution to get 3 days of exercise per week but has not been consistent with this.  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  She does Zumba.   She speaks to a coach at her insurance company until her insurance changed.   New insurance requests 2 nutrition and 4 pharmacy visits per meal. She is eating more plant based and is eating fish at times. She does occasionally eat meat out.  Referral diagnosis: dyslipidemia Preferred learning style: hands on Learning readiness: ready, change in progress   NUTRITION ASSESSMENT  67.5 282 lbs 01/24/2023 284 lbs 11/22/2023 306 lbs 04/2023  Body Composition Scale Date 01/24/24  Current Body Weight 281.1  Total Body Fat % 46.9  Visceral Fat 18  Fat-Free Mass % 53   Total Body Water  % 41  Muscle-Mass lbs 33.6  BMI 43.8  Body Fat Displacement          Torso  lbs 81.8         Left Leg  lbs 16.3         Right Leg  lbs 16.3         Left Arm  lbs 8.1         Right Arm   lbs 8.1   Clinical Medical Hx:  Type 2 (2022), kidney cancer (2022), nephrectomy (2023), hypothyroidism, HLD, HTN, CKD, depression/anxiety, fatty liver Medications: Mounjaro  04/2023, Jardiance , Metformin , levothyroxine  Labs: A1C 5.8% 10/07/2023 decreased from 6.6% 04/07/2023, BUN 24, Creatine 1.01, Potassium 4.5, GFR 64 on 10/01/2023, Vitamin B-12 302 on 07/15/2021, Vitamin D  22 on 07/15/2021 Lipid Panel     Component Value Date/Time   CHOL 188 10/01/2023 1044    TRIG 158.0 (H) 10/01/2023 1044   HDL 35.80 (L) 10/01/2023 1044   CHOLHDL 5 10/01/2023 1044   VLDL 31.6 10/01/2023 1044   LDLCALC 121 (H) 10/01/2023 1044   LDLCALC 105 (H) 12/07/2022 0819   LDLDIRECT 160.0 03/22/2018 1208    Notable Signs/Symptoms: none  Lifestyle & Dietary Hx Patient lives with her boyfriend.  Patient does the shopping and cooking. 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 Has been eating almost all vegetarian and fish at times when home.  Allergic to citrus fruit- causes migraines.  Estimated daily fluid intake: 90 oz Supplements: Vitamin D , Vitamin B-12 Sleep: fair - peri-menopause Stress / self-care: low stress Current average weekly physical activity: Zumba on Wednesday, walks 2 days per week for 1 mile but stopped, she just bought resistance bands but hasn't started  24-Hr Dietary Recall First Meal: greek yogurt and baked oatmeal squares Snack: none Second Meal: Nachos (meat, cheese, black beans, pico, few chips, guacamole) Snack: occasional  Third Meal:  asparagus, potato, [place] steak with ground turkey with gravy Snack: none Beverages: water , coffee with half and half, sugar sub, occasional diet coke  Estimated Energy Needs Calories: 1700-1800 Carbohydrate: 212 g Protein: 95-110g Fat: 55 g  NUTRITION DIAGNOSIS  NB-1.1 Food and nutrition-related knowledge deficit As related to balance of carbohydrates, protein, and fat for diabetes, renal health, and hyperlipidemia.  As evidenced by diet hx and patient report.   NUTRITION INTERVENTION  Nutrition education (E-1) on the following topics:  Benefits of exercise and patient goals.  Discussed adding resistance training and patient is ready to proceed.   A1C goals Hyperlipidemia, cholesterol, triglyceride, HDL, and LDL goals and impact of weight loss, exercise, and types of fat. Patient meals Discussed fiber intake related to constipation, diabetes control, and to help  lower cholesterol.  Handouts Provided Include   Types of fat Cholesterol and triglycerides  Learning Style & Readiness for Change Teaching method utilized: Visual & Auditory  Demonstrated degree of understanding via: Teach Back  Barriers to learning/adherence to lifestyle change: none  Goals Established by Pt   Eat more Non-Starchy Vegetables These include greens, broccoli, cauliflower, cabbage, carrots, beets, eggplant, peppers, squash and others. Minimize added sugars and refined grains Rethink what you drink.  Choose beverages without added sugar.  Look for 0 carbs on the label. See the list of whole grains below.  Find alternatives to usual sweet treats. Choose whole foods over processed. Make simple meals at home more often than eating out.  Tips to increase fiber in your diet: (All plants have fiber.  Eat a variety. There are more than are on this list.) Slowly increase the amount of fiber you eat to 25-35 grams per day.  (More is fine if you tolerate it.) Fiber from whole grains, nuts and seeds Quinoa, 1/2 cup = 5 grams Bulgur, 1/2 cup = 4.1 grams Popcorn, 3 cups = 3.6 grams Whole Wheat Spaghetti, 1/2 cup = 3.2 grams Barley, 1/2 cup = 3 grams Oatmeal, 1/2 cup = 2 grams Whole Wheat English Muffin = 3 grams Corn, 1/2 cup = 2.1 grams Brown Rice, 1/2 cup = 1.8 grams Flax seeds, 1 Tablespoon = 2.8 grams Chia seeds, 1 Tablespoon = 11 grams Almonds, 1 ounce = 3.5 grams fiber Fiber from legumes Kidney beans, 1/2 cup 7.9 grams Lentils, 1/2 cup = 7.8 grams Pinto beans, 1/2 cup = 7.7 grams Black beans, 1/2 cup = 7.6 grams Lima beans, 1/2 cup 6.4 grams Chick peas, 1/2 cup = 5.3 grams Black eyed peas, 1/2 cup = 4 grams Fiber from fruits and vegetables Pear, 6 grams Apple. 3.3 grams Raspberries or Blackberries, 3/4 cup = 6 grams Strawberries or Blueberries, 1 cup = 3.4 grams Baked sweet potato 3.8 grams fiber Baked potato with skin 4.4 grams  Peas, 1/2 cup = 4.4 grams   Spinach, 1/2 cup cooked = 3.5 grams  Avocado, 1/2 = 5 grams   MONITORING & EVALUATION Dietary intake, weekly physical activity, and label reading in 2 month.  Next Steps  Patient is to call for questions. SABRA   "

## 2024-02-01 ENCOUNTER — Other Ambulatory Visit: Payer: Self-pay | Admitting: Internal Medicine

## 2024-02-09 ENCOUNTER — Telehealth: Payer: Self-pay | Admitting: Pharmacy Technician

## 2024-02-09 ENCOUNTER — Encounter: Payer: Self-pay | Admitting: Internal Medicine

## 2024-02-09 ENCOUNTER — Other Ambulatory Visit (HOSPITAL_COMMUNITY): Payer: Self-pay

## 2024-02-09 DIAGNOSIS — E1122 Type 2 diabetes mellitus with diabetic chronic kidney disease: Secondary | ICD-10-CM

## 2024-02-09 MED ORDER — EMPAGLIFLOZIN 25 MG PO TABS: 25.0000 mg | ORAL_TABLET | Freq: Every day | ORAL | 3 refills | Status: AC

## 2024-02-09 NOTE — Telephone Encounter (Signed)
 I am not able to find her new insurance card by the Eligibility check. Can she upload a picture of it?

## 2024-02-10 ENCOUNTER — Telehealth: Payer: Self-pay | Admitting: Pharmacy Technician

## 2024-02-10 ENCOUNTER — Other Ambulatory Visit (HOSPITAL_COMMUNITY): Payer: Self-pay

## 2024-02-10 ENCOUNTER — Encounter: Payer: Self-pay | Admitting: Pharmacy Technician

## 2024-02-10 NOTE — Telephone Encounter (Signed)
 Pharmacy Patient Advocate Encounter   Received notification from Pt Calls Messages that prior authorization for MOUNJARO  10 MG/0.5ML Pen is required/requested.   Insurance verification completed.   The patient is insured through [employer].   Per test claim: PA required; PA submitted to above mentioned insurance via Prompt PA Key/confirmation #/[id number] Status is pending

## 2024-02-10 NOTE — Telephone Encounter (Signed)
 error

## 2024-04-12 ENCOUNTER — Ambulatory Visit: Admitting: Internal Medicine

## 2024-04-17 ENCOUNTER — Encounter: Admitting: Dietician

## 2024-05-08 ENCOUNTER — Ambulatory Visit: Payer: Self-pay | Admitting: Physician Assistant

## 2024-09-20 ENCOUNTER — Ambulatory Visit: Payer: Self-pay | Admitting: Physician Assistant
